# Patient Record
Sex: Male | Born: 1935 | Race: White | Hispanic: No | Marital: Married | Smoking: Never smoker
Health system: Southern US, Community
[De-identification: ages and names within clinical notes are randomized; demographics above are authoritative.]

## PROBLEM LIST (undated history)

## (undated) DIAGNOSIS — Z9109 Other allergy status, other than to drugs and biological substances: Secondary | ICD-10-CM

## (undated) DIAGNOSIS — N189 Chronic kidney disease, unspecified: Secondary | ICD-10-CM

## (undated) DIAGNOSIS — D649 Anemia, unspecified: Secondary | ICD-10-CM

## (undated) DIAGNOSIS — G629 Polyneuropathy, unspecified: Secondary | ICD-10-CM

## (undated) DIAGNOSIS — K219 Gastro-esophageal reflux disease without esophagitis: Secondary | ICD-10-CM

## (undated) DIAGNOSIS — G4700 Insomnia, unspecified: Secondary | ICD-10-CM

## (undated) DIAGNOSIS — C61 Malignant neoplasm of prostate: Secondary | ICD-10-CM

## (undated) DIAGNOSIS — D693 Immune thrombocytopenic purpura: Secondary | ICD-10-CM

## (undated) DIAGNOSIS — Z950 Presence of cardiac pacemaker: Secondary | ICD-10-CM

## (undated) DIAGNOSIS — G4734 Idiopathic sleep related nonobstructive alveolar hypoventilation: Secondary | ICD-10-CM

## (undated) DIAGNOSIS — M1990 Unspecified osteoarthritis, unspecified site: Secondary | ICD-10-CM

## (undated) DIAGNOSIS — I2510 Atherosclerotic heart disease of native coronary artery without angina pectoris: Secondary | ICD-10-CM

## (undated) DIAGNOSIS — D759 Disease of blood and blood-forming organs, unspecified: Secondary | ICD-10-CM

## (undated) DIAGNOSIS — I480 Paroxysmal atrial fibrillation: Secondary | ICD-10-CM

## (undated) DIAGNOSIS — I6389 Other cerebral infarction: Secondary | ICD-10-CM

## (undated) DIAGNOSIS — J449 Chronic obstructive pulmonary disease, unspecified: Secondary | ICD-10-CM

## (undated) DIAGNOSIS — I10 Essential (primary) hypertension: Secondary | ICD-10-CM

## (undated) DIAGNOSIS — E669 Obesity, unspecified: Secondary | ICD-10-CM

## (undated) DIAGNOSIS — I350 Nonrheumatic aortic (valve) stenosis: Secondary | ICD-10-CM

## (undated) DIAGNOSIS — R0600 Dyspnea, unspecified: Secondary | ICD-10-CM

## (undated) DIAGNOSIS — R011 Cardiac murmur, unspecified: Secondary | ICD-10-CM

## (undated) DIAGNOSIS — Z8719 Personal history of other diseases of the digestive system: Secondary | ICD-10-CM

## (undated) DIAGNOSIS — I499 Cardiac arrhythmia, unspecified: Secondary | ICD-10-CM

## (undated) DIAGNOSIS — E785 Hyperlipidemia, unspecified: Secondary | ICD-10-CM

## (undated) DIAGNOSIS — I509 Heart failure, unspecified: Secondary | ICD-10-CM

## (undated) HISTORY — PX: EYE SURGERY: SHX253

## (undated) HISTORY — PX: TESTICLE SURGERY: SHX794

## (undated) HISTORY — PX: CARDIAC VALVE REPLACEMENT: SHX585

## (undated) HISTORY — PX: TONSILLECTOMY: SUR1361

## (undated) HISTORY — PX: INSERT / REPLACE / REMOVE PACEMAKER: SUR710

## (undated) HISTORY — PX: POLYPECTOMY: SHX149

## (undated) HISTORY — PX: TUMOR EXCISION: SHX421

## (undated) HISTORY — PX: CHOLECYSTECTOMY: SHX55

---

## 2004-09-03 ENCOUNTER — Ambulatory Visit: Payer: Self-pay | Admitting: Oncology

## 2005-09-01 ENCOUNTER — Ambulatory Visit: Payer: Self-pay | Admitting: Oncology

## 2006-09-03 ENCOUNTER — Ambulatory Visit: Payer: Self-pay | Admitting: Oncology

## 2006-10-29 ENCOUNTER — Ambulatory Visit: Payer: Self-pay | Admitting: Oncology

## 2006-12-23 ENCOUNTER — Ambulatory Visit: Payer: Self-pay | Admitting: Oncology

## 2007-06-10 ENCOUNTER — Ambulatory Visit: Payer: Self-pay | Admitting: Oncology

## 2011-10-08 DIAGNOSIS — I300 Acute nonspecific idiopathic pericarditis: Secondary | ICD-10-CM | POA: Diagnosis not present

## 2011-10-08 DIAGNOSIS — I2510 Atherosclerotic heart disease of native coronary artery without angina pectoris: Secondary | ICD-10-CM | POA: Diagnosis present

## 2011-10-08 DIAGNOSIS — E669 Obesity, unspecified: Secondary | ICD-10-CM | POA: Diagnosis present

## 2011-10-08 DIAGNOSIS — J15212 Pneumonia due to Methicillin resistant Staphylococcus aureus: Secondary | ICD-10-CM | POA: Diagnosis not present

## 2011-10-08 DIAGNOSIS — E785 Hyperlipidemia, unspecified: Secondary | ICD-10-CM | POA: Diagnosis present

## 2011-10-08 DIAGNOSIS — J9600 Acute respiratory failure, unspecified whether with hypoxia or hypercapnia: Secondary | ICD-10-CM | POA: Diagnosis present

## 2011-10-08 DIAGNOSIS — R791 Abnormal coagulation profile: Secondary | ICD-10-CM | POA: Diagnosis not present

## 2011-10-08 DIAGNOSIS — R0902 Hypoxemia: Secondary | ICD-10-CM | POA: Diagnosis not present

## 2011-10-08 DIAGNOSIS — R1013 Epigastric pain: Secondary | ICD-10-CM | POA: Diagnosis not present

## 2011-10-08 DIAGNOSIS — J189 Pneumonia, unspecified organism: Secondary | ICD-10-CM | POA: Diagnosis not present

## 2011-10-08 DIAGNOSIS — I080 Rheumatic disorders of both mitral and aortic valves: Secondary | ICD-10-CM | POA: Diagnosis not present

## 2011-10-08 DIAGNOSIS — J180 Bronchopneumonia, unspecified organism: Secondary | ICD-10-CM | POA: Diagnosis not present

## 2011-10-08 DIAGNOSIS — I369 Nonrheumatic tricuspid valve disorder, unspecified: Secondary | ICD-10-CM | POA: Diagnosis not present

## 2011-10-08 DIAGNOSIS — N138 Other obstructive and reflux uropathy: Secondary | ICD-10-CM | POA: Diagnosis not present

## 2011-10-08 DIAGNOSIS — R339 Retention of urine, unspecified: Secondary | ICD-10-CM | POA: Diagnosis present

## 2011-10-08 DIAGNOSIS — J90 Pleural effusion, not elsewhere classified: Secondary | ICD-10-CM | POA: Diagnosis not present

## 2011-10-08 DIAGNOSIS — R000 Tachycardia, unspecified: Secondary | ICD-10-CM | POA: Diagnosis not present

## 2011-10-08 DIAGNOSIS — E871 Hypo-osmolality and hyponatremia: Secondary | ICD-10-CM | POA: Diagnosis not present

## 2011-10-08 DIAGNOSIS — M109 Gout, unspecified: Secondary | ICD-10-CM | POA: Diagnosis present

## 2011-10-08 DIAGNOSIS — I4510 Unspecified right bundle-branch block: Secondary | ICD-10-CM | POA: Diagnosis not present

## 2011-10-08 DIAGNOSIS — I4891 Unspecified atrial fibrillation: Secondary | ICD-10-CM | POA: Diagnosis present

## 2011-10-08 DIAGNOSIS — Z7982 Long term (current) use of aspirin: Secondary | ICD-10-CM | POA: Diagnosis not present

## 2011-10-08 DIAGNOSIS — R899 Unspecified abnormal finding in specimens from other organs, systems and tissues: Secondary | ICD-10-CM | POA: Diagnosis not present

## 2011-10-08 DIAGNOSIS — I10 Essential (primary) hypertension: Secondary | ICD-10-CM | POA: Diagnosis present

## 2011-10-08 DIAGNOSIS — R0602 Shortness of breath: Secondary | ICD-10-CM | POA: Diagnosis not present

## 2011-10-08 DIAGNOSIS — I359 Nonrheumatic aortic valve disorder, unspecified: Secondary | ICD-10-CM | POA: Diagnosis not present

## 2011-10-08 DIAGNOSIS — N401 Enlarged prostate with lower urinary tract symptoms: Secondary | ICD-10-CM | POA: Diagnosis present

## 2011-10-08 DIAGNOSIS — I059 Rheumatic mitral valve disease, unspecified: Secondary | ICD-10-CM | POA: Diagnosis not present

## 2011-10-08 DIAGNOSIS — Z8546 Personal history of malignant neoplasm of prostate: Secondary | ICD-10-CM | POA: Diagnosis not present

## 2011-10-08 DIAGNOSIS — R072 Precordial pain: Secondary | ICD-10-CM | POA: Diagnosis not present

## 2011-10-08 DIAGNOSIS — M1990 Unspecified osteoarthritis, unspecified site: Secondary | ICD-10-CM | POA: Diagnosis present

## 2011-10-08 DIAGNOSIS — I209 Angina pectoris, unspecified: Secondary | ICD-10-CM | POA: Diagnosis present

## 2011-10-08 DIAGNOSIS — Z6825 Body mass index (BMI) 25.0-25.9, adult: Secondary | ICD-10-CM | POA: Diagnosis not present

## 2011-10-08 DIAGNOSIS — D6949 Other primary thrombocytopenia: Secondary | ICD-10-CM | POA: Diagnosis not present

## 2011-10-08 DIAGNOSIS — R509 Fever, unspecified: Secondary | ICD-10-CM | POA: Diagnosis not present

## 2011-10-08 DIAGNOSIS — J45901 Unspecified asthma with (acute) exacerbation: Secondary | ICD-10-CM | POA: Diagnosis not present

## 2011-10-16 ENCOUNTER — Encounter (HOSPITAL_COMMUNITY): Payer: Self-pay | Admitting: Acute Care

## 2011-10-16 ENCOUNTER — Inpatient Hospital Stay (HOSPITAL_COMMUNITY): Payer: Medicare Other

## 2011-10-16 ENCOUNTER — Inpatient Hospital Stay (HOSPITAL_COMMUNITY)
Admission: AD | Admit: 2011-10-16 | Discharge: 2011-11-03 | DRG: 853 | Disposition: A | Discharge status: Rehabilitation Facility | Payer: Medicare Other | Source: Other Acute Inpatient Hospital | Attending: Internal Medicine | Admitting: Internal Medicine

## 2011-10-16 DIAGNOSIS — R899 Unspecified abnormal finding in specimens from other organs, systems and tissues: Secondary | ICD-10-CM | POA: Diagnosis not present

## 2011-10-16 DIAGNOSIS — A4102 Sepsis due to Methicillin resistant Staphylococcus aureus: Principal | ICD-10-CM | POA: Diagnosis present

## 2011-10-16 DIAGNOSIS — D696 Thrombocytopenia, unspecified: Secondary | ICD-10-CM | POA: Diagnosis present

## 2011-10-16 DIAGNOSIS — Z825 Family history of asthma and other chronic lower respiratory diseases: Secondary | ICD-10-CM

## 2011-10-16 DIAGNOSIS — IMO0002 Reserved for concepts with insufficient information to code with codable children: Secondary | ICD-10-CM | POA: Diagnosis not present

## 2011-10-16 DIAGNOSIS — J189 Pneumonia, unspecified organism: Secondary | ICD-10-CM | POA: Diagnosis not present

## 2011-10-16 DIAGNOSIS — T797XXA Traumatic subcutaneous emphysema, initial encounter: Secondary | ICD-10-CM | POA: Diagnosis not present

## 2011-10-16 DIAGNOSIS — R05 Cough: Secondary | ICD-10-CM | POA: Diagnosis not present

## 2011-10-16 DIAGNOSIS — R000 Tachycardia, unspecified: Secondary | ICD-10-CM | POA: Diagnosis not present

## 2011-10-16 DIAGNOSIS — G4700 Insomnia, unspecified: Secondary | ICD-10-CM | POA: Insufficient documentation

## 2011-10-16 DIAGNOSIS — E871 Hypo-osmolality and hyponatremia: Secondary | ICD-10-CM | POA: Diagnosis present

## 2011-10-16 DIAGNOSIS — E876 Hypokalemia: Secondary | ICD-10-CM | POA: Diagnosis not present

## 2011-10-16 DIAGNOSIS — E785 Hyperlipidemia, unspecified: Secondary | ICD-10-CM | POA: Diagnosis present

## 2011-10-16 DIAGNOSIS — J869 Pyothorax without fistula: Secondary | ICD-10-CM | POA: Diagnosis present

## 2011-10-16 DIAGNOSIS — K560 Paralytic ileus: Secondary | ICD-10-CM | POA: Diagnosis present

## 2011-10-16 DIAGNOSIS — R109 Unspecified abdominal pain: Secondary | ICD-10-CM | POA: Diagnosis not present

## 2011-10-16 DIAGNOSIS — E669 Obesity, unspecified: Secondary | ICD-10-CM | POA: Diagnosis not present

## 2011-10-16 DIAGNOSIS — C61 Malignant neoplasm of prostate: Secondary | ICD-10-CM | POA: Insufficient documentation

## 2011-10-16 DIAGNOSIS — M1990 Unspecified osteoarthritis, unspecified site: Secondary | ICD-10-CM | POA: Insufficient documentation

## 2011-10-16 DIAGNOSIS — I359 Nonrheumatic aortic valve disorder, unspecified: Secondary | ICD-10-CM

## 2011-10-16 DIAGNOSIS — I10 Essential (primary) hypertension: Secondary | ICD-10-CM | POA: Diagnosis present

## 2011-10-16 DIAGNOSIS — Z8546 Personal history of malignant neoplasm of prostate: Secondary | ICD-10-CM

## 2011-10-16 DIAGNOSIS — Z79899 Other long term (current) drug therapy: Secondary | ICD-10-CM | POA: Diagnosis not present

## 2011-10-16 DIAGNOSIS — K5900 Constipation, unspecified: Secondary | ICD-10-CM

## 2011-10-16 DIAGNOSIS — J45909 Unspecified asthma, uncomplicated: Secondary | ICD-10-CM | POA: Diagnosis present

## 2011-10-16 DIAGNOSIS — M109 Gout, unspecified: Secondary | ICD-10-CM | POA: Diagnosis present

## 2011-10-16 DIAGNOSIS — J9600 Acute respiratory failure, unspecified whether with hypoxia or hypercapnia: Secondary | ICD-10-CM | POA: Diagnosis not present

## 2011-10-16 DIAGNOSIS — R142 Eructation: Secondary | ICD-10-CM | POA: Diagnosis not present

## 2011-10-16 DIAGNOSIS — R0602 Shortness of breath: Secondary | ICD-10-CM | POA: Diagnosis not present

## 2011-10-16 DIAGNOSIS — T17908A Unspecified foreign body in respiratory tract, part unspecified causing other injury, initial encounter: Secondary | ICD-10-CM | POA: Diagnosis not present

## 2011-10-16 DIAGNOSIS — J860 Pyothorax with fistula: Secondary | ICD-10-CM | POA: Diagnosis not present

## 2011-10-16 DIAGNOSIS — Z7982 Long term (current) use of aspirin: Secondary | ICD-10-CM

## 2011-10-16 DIAGNOSIS — A419 Sepsis, unspecified organism: Secondary | ICD-10-CM | POA: Diagnosis present

## 2011-10-16 DIAGNOSIS — K219 Gastro-esophageal reflux disease without esophagitis: Secondary | ICD-10-CM | POA: Diagnosis not present

## 2011-10-16 DIAGNOSIS — J9819 Other pulmonary collapse: Secondary | ICD-10-CM | POA: Diagnosis not present

## 2011-10-16 DIAGNOSIS — J90 Pleural effusion, not elsewhere classified: Secondary | ICD-10-CM | POA: Diagnosis not present

## 2011-10-16 DIAGNOSIS — R141 Gas pain: Secondary | ICD-10-CM | POA: Diagnosis not present

## 2011-10-16 DIAGNOSIS — Z7901 Long term (current) use of anticoagulants: Secondary | ICD-10-CM

## 2011-10-16 DIAGNOSIS — R0600 Dyspnea, unspecified: Secondary | ICD-10-CM | POA: Diagnosis present

## 2011-10-16 DIAGNOSIS — Z8614 Personal history of Methicillin resistant Staphylococcus aureus infection: Secondary | ICD-10-CM | POA: Diagnosis not present

## 2011-10-16 DIAGNOSIS — I4891 Unspecified atrial fibrillation: Secondary | ICD-10-CM | POA: Diagnosis present

## 2011-10-16 DIAGNOSIS — Z09 Encounter for follow-up examination after completed treatment for conditions other than malignant neoplasm: Secondary | ICD-10-CM | POA: Diagnosis not present

## 2011-10-16 DIAGNOSIS — J9811 Atelectasis: Secondary | ICD-10-CM | POA: Diagnosis present

## 2011-10-16 DIAGNOSIS — J9383 Other pneumothorax: Secondary | ICD-10-CM | POA: Diagnosis not present

## 2011-10-16 DIAGNOSIS — R5381 Other malaise: Secondary | ICD-10-CM | POA: Diagnosis not present

## 2011-10-16 DIAGNOSIS — J13 Pneumonia due to Streptococcus pneumoniae: Secondary | ICD-10-CM | POA: Diagnosis not present

## 2011-10-16 DIAGNOSIS — R0609 Other forms of dyspnea: Secondary | ICD-10-CM | POA: Diagnosis not present

## 2011-10-16 DIAGNOSIS — G629 Polyneuropathy, unspecified: Secondary | ICD-10-CM | POA: Insufficient documentation

## 2011-10-16 DIAGNOSIS — D473 Essential (hemorrhagic) thrombocythemia: Secondary | ICD-10-CM | POA: Insufficient documentation

## 2011-10-16 DIAGNOSIS — J15212 Pneumonia due to Methicillin resistant Staphylococcus aureus: Secondary | ICD-10-CM | POA: Diagnosis present

## 2011-10-16 DIAGNOSIS — J984 Other disorders of lung: Secondary | ICD-10-CM | POA: Diagnosis not present

## 2011-10-16 DIAGNOSIS — Z8249 Family history of ischemic heart disease and other diseases of the circulatory system: Secondary | ICD-10-CM

## 2011-10-16 DIAGNOSIS — R143 Flatulence: Secondary | ICD-10-CM | POA: Diagnosis not present

## 2011-10-16 DIAGNOSIS — G4734 Idiopathic sleep related nonobstructive alveolar hypoventilation: Secondary | ICD-10-CM | POA: Insufficient documentation

## 2011-10-16 DIAGNOSIS — Z9109 Other allergy status, other than to drugs and biological substances: Secondary | ICD-10-CM | POA: Insufficient documentation

## 2011-10-16 DIAGNOSIS — R339 Retention of urine, unspecified: Secondary | ICD-10-CM | POA: Diagnosis not present

## 2011-10-16 DIAGNOSIS — Y9210 Unspecified residential institution as the place of occurrence of the external cause: Secondary | ICD-10-CM | POA: Diagnosis not present

## 2011-10-16 DIAGNOSIS — I959 Hypotension, unspecified: Secondary | ICD-10-CM | POA: Diagnosis not present

## 2011-10-16 DIAGNOSIS — I480 Paroxysmal atrial fibrillation: Secondary | ICD-10-CM | POA: Insufficient documentation

## 2011-10-16 DIAGNOSIS — Z5189 Encounter for other specified aftercare: Secondary | ICD-10-CM | POA: Diagnosis not present

## 2011-10-16 DIAGNOSIS — R0902 Hypoxemia: Secondary | ICD-10-CM

## 2011-10-16 DIAGNOSIS — J159 Unspecified bacterial pneumonia: Secondary | ICD-10-CM | POA: Diagnosis not present

## 2011-10-16 DIAGNOSIS — R918 Other nonspecific abnormal finding of lung field: Secondary | ICD-10-CM | POA: Diagnosis not present

## 2011-10-16 DIAGNOSIS — K567 Ileus, unspecified: Secondary | ICD-10-CM | POA: Diagnosis present

## 2011-10-16 HISTORY — DX: Unspecified osteoarthritis, unspecified site: M19.90

## 2011-10-16 HISTORY — DX: Paroxysmal atrial fibrillation: I48.0

## 2011-10-16 HISTORY — DX: Malignant neoplasm of prostate: C61

## 2011-10-16 HISTORY — DX: Idiopathic sleep related nonobstructive alveolar hypoventilation: G47.34

## 2011-10-16 HISTORY — DX: Insomnia, unspecified: G47.00

## 2011-10-16 HISTORY — DX: Polyneuropathy, unspecified: G62.9

## 2011-10-16 HISTORY — DX: Essential (primary) hypertension: I10

## 2011-10-16 HISTORY — DX: Immune thrombocytopenic purpura: D69.3

## 2011-10-16 HISTORY — DX: Obesity, unspecified: E66.9

## 2011-10-16 HISTORY — DX: Hyperlipidemia, unspecified: E78.5

## 2011-10-16 HISTORY — DX: Other allergy status, other than to drugs and biological substances: Z91.09

## 2011-10-16 LAB — PHOSPHORUS: Phosphorus: 3.2 mg/dL (ref 2.3–4.6)

## 2011-10-16 LAB — COMPREHENSIVE METABOLIC PANEL
ALT: 39 U/L (ref 0–53)
CO2: 29 mEq/L (ref 19–32)
Calcium: 8.2 mg/dL — ABNORMAL LOW (ref 8.4–10.5)
Creatinine, Ser: 1.25 mg/dL (ref 0.50–1.35)
GFR calc Af Amer: 59 mL/min — ABNORMAL LOW (ref >90)
GFR calc non Af Amer: 51 mL/min — ABNORMAL LOW (ref >90)
Glucose, Bld: 160 mg/dL — ABNORMAL HIGH (ref 70–99)
Sodium: 133 mEq/L — ABNORMAL LOW (ref 135–145)
Total Protein: 5.6 g/dL — ABNORMAL LOW (ref 6.0–8.3)

## 2011-10-16 LAB — LEGIONELLA ANTIGEN, URINE

## 2011-10-16 LAB — PROTIME-INR: INR: 1.54 — ABNORMAL HIGH (ref 0.00–1.49)

## 2011-10-16 LAB — GLUCOSE, CAPILLARY
Glucose-Capillary: 140 mg/dL — ABNORMAL HIGH (ref 70–99)
Glucose-Capillary: 149 mg/dL — ABNORMAL HIGH (ref 70–99)

## 2011-10-16 LAB — CARDIAC PANEL(CRET KIN+CKTOT+MB+TROPI)
CK, MB: 1.6 ng/mL (ref 0.3–4.0)
Total CK: 23 U/L (ref 7–232)
Troponin I: <0.3 ng/mL (ref <0.30)

## 2011-10-16 LAB — APTT: aPTT: 33 seconds (ref 24–37)

## 2011-10-16 LAB — MRSA PCR SCREENING: MRSA by PCR: POSITIVE — AB

## 2011-10-16 LAB — CBC
Hemoglobin: 14.8 g/dL (ref 13.0–17.0)
MCH: 32.1 pg (ref 26.0–34.0)
MCHC: 34.3 g/dL (ref 30.0–36.0)
MCV: 93.5 fL (ref 78.0–100.0)
Platelets: 438 10*3/uL — ABNORMAL HIGH (ref 150–400)
RBC: 4.61 MIL/uL (ref 4.22–5.81)

## 2011-10-16 LAB — SEDIMENTATION RATE: Sed Rate: 25 mm/hr — ABNORMAL HIGH (ref 0–16)

## 2011-10-16 LAB — PROCALCITONIN: Procalcitonin: 0.2 ng/mL

## 2011-10-16 MED ORDER — HEPARIN BOLUS VIA INFUSION
4000.0000 [IU] | Freq: Once | INTRAVENOUS | Status: DC
  Filled 2011-10-16: qty 4000

## 2011-10-16 MED ORDER — SODIUM CHLORIDE 0.9 % IV SOLN
INTRAVENOUS | Status: DC
  Administered 2011-10-16 – 2011-10-23 (×7): via INTRAVENOUS

## 2011-10-16 MED ORDER — AMIODARONE HCL IN DEXTROSE 360-4.14 MG/200ML-% IV SOLN
0.5000 mg/min | INTRAVENOUS | Status: DC
  Administered 2011-10-16 – 2011-10-17 (×2): 0.5 mg/min via INTRAVENOUS
  Filled 2011-10-16 (×4): qty 200

## 2011-10-16 MED ORDER — POLYETHYLENE GLYCOL 3350 17 G PO PACK
17.0000 g | PACK | Freq: Every day | ORAL | Status: AC
  Administered 2011-10-16 – 2011-10-18 (×3): 17 g via ORAL
  Filled 2011-10-16 (×4): qty 1

## 2011-10-16 MED ORDER — VANCOMYCIN HCL 1000 MG IV SOLR
750.0000 mg | INTRAVENOUS | Status: AC
  Administered 2011-10-16: 750 mg via INTRAVENOUS
  Filled 2011-10-16: qty 750

## 2011-10-16 MED ORDER — IOHEXOL 300 MG/ML  SOLN: 20.0000 mL | INTRAMUSCULAR | Status: AC

## 2011-10-16 MED ORDER — ALBUTEROL SULFATE (5 MG/ML) 0.5% IN NEBU
2.5000 mg | INHALATION_SOLUTION | Freq: Four times a day (QID) | RESPIRATORY_TRACT | Status: DC
  Administered 2011-10-16 – 2011-10-19 (×12): 2.5 mg via RESPIRATORY_TRACT
  Filled 2011-10-16 (×12): qty 0.5

## 2011-10-16 MED ORDER — INSULIN ASPART 100 UNIT/ML ~~LOC~~ SOLN
1.0000 [IU] | SUBCUTANEOUS | Status: DC | PRN
  Administered 2011-10-19 (×2): 1 [IU] via SUBCUTANEOUS
  Filled 2011-10-16: qty 3

## 2011-10-16 MED ORDER — AMIODARONE HCL IN DEXTROSE 360-4.14 MG/200ML-% IV SOLN
1.0000 mg/min | INTRAVENOUS | Status: AC
  Administered 2011-10-16 (×2): 1 mg/min via INTRAVENOUS
  Filled 2011-10-16 (×4): qty 200

## 2011-10-16 MED ORDER — ACETAMINOPHEN 325 MG PO TABS
650.0000 mg | ORAL_TABLET | Freq: Four times a day (QID) | ORAL | Status: DC | PRN
  Administered 2011-10-16 – 2011-10-22 (×2): 650 mg via ORAL
  Filled 2011-10-16 (×2): qty 2

## 2011-10-16 MED ORDER — HEPARIN BOLUS VIA INFUSION
2000.0000 [IU] | Freq: Once | INTRAVENOUS | Status: AC
  Administered 2011-10-16: 2000 [IU] via INTRAVENOUS
  Filled 2011-10-16: qty 2000

## 2011-10-16 MED ORDER — PANTOPRAZOLE SODIUM 40 MG IV SOLR
40.0000 mg | Freq: Two times a day (BID) | INTRAVENOUS | Status: DC
  Administered 2011-10-16 – 2011-10-18 (×5): 40 mg via INTRAVENOUS
  Filled 2011-10-16 (×5): qty 40

## 2011-10-16 MED ORDER — AMIODARONE LOAD VIA INFUSION
150.0000 mg | Freq: Once | INTRAVENOUS | Status: AC
  Administered 2011-10-16: 150 mg via INTRAVENOUS
  Filled 2011-10-16: qty 83.34

## 2011-10-16 MED ORDER — AMIODARONE LOAD VIA INFUSION
150.0000 mg | Freq: Once | INTRAVENOUS | Status: DC
  Filled 2011-10-16: qty 83.34

## 2011-10-16 MED ORDER — HEPARIN SOD (PORCINE) IN D5W 100 UNIT/ML IV SOLN
1650.0000 [IU]/h | INTRAVENOUS | Status: DC
  Administered 2011-10-16 (×2): 1250 [IU]/h via INTRAVENOUS
  Administered 2011-10-17: 1650 [IU]/h via INTRAVENOUS
  Filled 2011-10-16 (×2): qty 250

## 2011-10-16 MED ORDER — MAGNESIUM CITRATE PO SOLN
1.0000 | Freq: Once | ORAL | Status: AC
  Administered 2011-10-16: 1 via ORAL
  Filled 2011-10-16: qty 296

## 2011-10-16 MED ORDER — DILTIAZEM HCL 100 MG IV SOLR
5.0000 mg/h | INTRAVENOUS | Status: DC
  Administered 2011-10-16: 20 mg/h via INTRAVENOUS
  Filled 2011-10-16: qty 100

## 2011-10-16 MED ORDER — EPINEPHRINE HCL 0.1 MG/ML IJ SOLN
INTRAMUSCULAR | Status: AC
  Filled 2011-10-16: qty 10

## 2011-10-16 MED ORDER — VANCOMYCIN HCL 1000 MG IV SOLR
750.0000 mg | Freq: Two times a day (BID) | INTRAVENOUS | Status: DC
  Administered 2011-10-17 – 2011-10-19 (×6): 750 mg via INTRAVENOUS
  Filled 2011-10-16 (×7): qty 750

## 2011-10-16 MED ORDER — ERYTHROMYCIN BASE 250 MG PO TBEC
500.0000 mg | DELAYED_RELEASE_TABLET | Freq: Three times a day (TID) | ORAL | Status: AC
  Administered 2011-10-16 – 2011-10-17 (×3): 500 mg via ORAL
  Filled 2011-10-16 (×5): qty 2

## 2011-10-16 MED ORDER — PIPERACILLIN-TAZOBACTAM 3.375 G IVPB
3.3750 g | Freq: Three times a day (TID) | INTRAVENOUS | Status: DC
  Administered 2011-10-16 – 2011-10-19 (×10): 3.375 g via INTRAVENOUS
  Filled 2011-10-16 (×12): qty 50

## 2011-10-16 MED ORDER — SODIUM CHLORIDE 0.9 % IV SOLN: 750.0000 mg | Freq: Two times a day (BID) | INTRAVENOUS | Status: DC

## 2011-10-16 NOTE — Progress Notes (Signed)
[facility] NOTE  Pharmacy Consult for Vancomycin, Heparin IV Indication: MRSA PNA, Hx afib    Allergies not on file  Patient Measurements: Height: 5\' 11"  (180.3 cm) Weight: 195 lb 8.8 oz (88.7 kg) IBW/kg (Calculated) : 75.3  Heparin dosing weight:89Kg  Vital Signs:   Intake/Output from previous day:   Intake/Output from this shift:   Vent settings for last 24 hours:    Labs: No results found for this basename: WBC:3,HGB:3,HCT:3,PLT:3,APTT:3,INR:3,CREATININE:3,LABCREA:3,CREATININE:3,LABCREA:3,CREAT24HRUR:3,MG:3,PHOS:3,ALBUMIN:3,PROT:3,AST:3,ALT:3,ALKPHOS:3,BILITOT:3,BILIDIR:3,IBILI:3 in the last 72 hours CrCl is unknown because no creatinine reading has been taken.   Basename 10/16/11 [place]    Microbiology: No results found for this or any previous visit (from the past 720 hour(s)).  Medications:  Albuterol, allopurinol, BASA, astelin, pulmicort, tums, cardizem CD, hydroxyurea, nasonex, fish oil, lyrica, zocor, flomax, warfarin, apap prn, voltaren gel, ambien  Admit Complaint: Patient admitted to [facility] with SOB, fever and progressively worsening cough. Transferred here for further management of PNA, and concerns for possible empyema.    Pharmacist System-Based Medication Review: Anticoagulation Hx Afib, on Dilt and warfarin PTA. INR 1.5 here today. Received orders to start IV heparin in anticipation of procedures while in ICU. H/H: 12.7/38.8 and Plts 439 today at OSH.  Infectious Disease Vanc#6 for MRSA PNA, started at OSH at 1500mg  IV q 24h, trough on 1/8 was low at 18mcg/ml, dose was increased to Vanc 1gm IV q 12h, last dose documented at OSH was at 2200 1/9. Patient was also on Rocephin and Levaquin at OSH. WBC today at OSH was 18.2. CCM team contemplating GNR coverage.  Cardiovascular Afib- on dilt, warfarin, zocor, fish oil PTA.  Endocrinology No prior hx DM  Gastrointestinal / Nutrition f/up plans  Neurology A & O, conversant    Nephrology Scr 1.11 at OSH, est GFR ~50ml/min  Pulmonary Astelin, pulmicort, nasonex PTA  Hematology / Oncology Hydrxyurea PTA, WBC elevated, Plts incr likely d/t sepsis, H/H ok at baseline  PTA Medication Issues Med rec tech completing med rec, above list obtained from OSH  Best Practices IV heparin    Goal of Therapy:  Vanc trough 15-20 mcg/ml Heparin level 0.3-0.7 iu/ ml   Plan:  1. Vancomycin 750mg  IV q 12h, start ASAP 2. Plan to check Css trough for MRSA PNA 3. Heparin drip at 1250 units/hr after a 2000 units IV bolus (bolus decreased d/t elevated INR), check heparin level 8 hours after drip starts. 4. Daily heparin level and CBC 5. Check daily PT/INR  Kaylaann Mountz K. Posey Pronto, PharmD, BCPS.  Clinical Pharmacist Pager [phone]. 10/16/2011 1:56 PM

## 2011-10-16 NOTE — Progress Notes (Addendum)
MEDICATION RELATED CONSULT NOTE - INITIAL   Pharmacy Consult for  Amiodarone Drug/Drug Interactions Medications:  Inpatient Scheduled:    . albuterol  2.5 mg Nebulization Q6H  . amiodarone  150 mg Intravenous Once  . erythromycin  500 mg Oral Q8H  . heparin  2,000 Units Intravenous Once  . magnesium citrate  1 Bottle Oral Once  . pantoprazole (PROTONIX) IV  40 mg Intravenous Q12H  . piperacillin-tazobactam (ZOSYN)  IV  3.375 g Intravenous Q8H  . polyethylene glycol  17 g Oral Daily  . vancomycin  750 mg Intravenous Q12H   Medications:  Prescriptions prior to admission  Medication Sig Dispense Refill  . acetaminophen (TYLENOL) 500 MG tablet Take 500 mg by mouth every other day.      . ALBUTEROL IN Inhale 1 vial into the lungs daily.      . ALLOPURINOL PO Take 1 tablet by mouth daily.      Marland Kitchen aspirin EC 81 MG tablet Take 81 mg by mouth daily.      Marland Kitchen azelastine (ASTELIN) 137 MCG/SPRAY nasal spray Place 1 spray into the nose 2 (two) times daily. Use in each nostril as directed      . Budesonide (PULMICORT IN) Inhale 2 vials into the lungs daily.      Marland Kitchen DILTIAZEM HCL PO Take 1 capsule by mouth daily.      . fish oil-omega-3 fatty acids 1000 MG capsule Take 1 g by mouth daily.      Marland Kitchen HYDROXYUREA PO Take 1 tablet by mouth every other day.      . mometasone (NASONEX) 50 MCG/ACT nasal spray Place 2 sprays into the nose daily.      . pregabalin (LYRICA) 50 MG capsule Take 50 mg by mouth every other day.      Marland Kitchen SIMVASTATIN PO Take 1 tablet by mouth every evening.      . Tamsulosin HCl (FLOMAX) 0.4 MG CAPS Take 0.4 mg by mouth daily.      Marland Kitchen warfarin (COUMADIN) 4 MG tablet Take 4 mg by mouth 5 (five) times daily.      Marland Kitchen warfarin (COUMADIN) 5 MG tablet Take 5 mg by mouth 2 (two) times daily.        Medical History: Past Medical History  Diagnosis Date  . PAF (paroxysmal atrial fibrillation)     followed by [place] cardiology  . Aortic valve disease     followed by [facility] cardiology  .  Prostate cancer     s/p seed implants  . Environmental allergies   . Asthma   . Essential thrombocytopenia     followed by Dr Bobby Rumpf (hematology)  . Neuropathy   . Insomnia   . Gout   . Obesity   . Hypertension   . Hyperlipidemia   . Osteoarthritis   . Hypoxia, sleep related     Assessment: Amiodarone may interact with beta-blockers such as atenolol (Tenormin), propranolol (Inderal), metoprolol (Lopressor), or certain calcium channel blockers, such as verapamil (Calan, Isoptin, Verelan, Covera-HS) or diltiazem (Cardizem, Dilacor, Tiazac), resulting in an excessively slow heart rate or a block in the conduction of the electrical impulse through the heart.  Amiodarone increases the blood levels of digoxin (Lanoxin) when the two drugs are given together. It is recommended that the dose of digoxin be cut by 50% when amiodarone therapy is started. Flecainide (Tambocor) blood concentrations increase by more than 50% with amiodarone. Procainamide (Procan-SR, Pronestyl) and quinidine (Quinidex, Quinaglute) concentrations increase by 30%-50% during the  first week of amiodarone therapy. Additive electrical effects occurs with these combinations, and worsening arrhythmias may occur as a result. Some experts recommend that the doses of these other drugs be reduced when amiodarone is started. Amiodarone can result in phenytoin (Dilantin) toxicity because it causes a two- or three-fold increase in blood concentrations of phenytoin.   Amiodarone also can interact with tricyclic antidepressants (for example, amitriptyline [Endep, Elavil]), or phenothiazines (for example, chlorpromazine [Thorazine]) and potentially cause serious arrhythmias.  Amiodarone interacts with warfarin (Coumadin) and increases the risk of bleeding. The bleeding can be serious or even fatal. This effect can occur as early as 4-6 days after the start of the combination of drugs or can be delayed by a few weeks. Clotting studies probably  should be done early during treatment with amiodarone among patients taking warfarin.  Amiodarone can interact with some cholesterol-lowering medicines of the statin class, such as simvastatin (Zocor), atorvastatin (Lipitor), and lovastatin (Mevacor), increasing the side effects of statins which include severe muscle breakdown, kidney failure or liver disease. This interaction is dose-related, meaning that lower doses of statins are safer than higher doses when used with amiodarone. An alternative statin, pravastatin (Pravachol), does not share this interaction and is safer in patients taking amiodarone.  Amiodarone inhibits the metabolism of dextromethorphan, the cough suppressant found in most over-the-counter (and some prescription) cough and cold medications (for example, Robitussin-DM). Although the significance of the interaction is unknown, these two drugs probably should not be taken together if possible.  Grapefruit juice may reduce the breakdown of amiodarone in the stomach leading to increased amiodarone blood levels. Grapefruit juice should be avoided during treatment with amiodarone  There are 704 reported drug interactions with Amiodarone, however, those listed above are primarily the major ones.  His inpatient regimen does not have any of these, but his outpatient regimen does.  Risk / benefit analysis should be made if this patient will continue to receive Amiodarone at discharge.  Dose lowering for Warfarin, Digoxin, Dilantin and statin therapy are warranted.  Goal of Therapy:  Minimize risk associated with Amiodarone therapy.  Plan:  Monitor for drug/drug interacting medications and adjust doses as needed. Evaluate interacting drug levels to ensure therapeutic goals.    Rober Minion, PharmD., MS Clinical Pharmacist Pager:  [phone] 10/16/2011,4:11 PM

## 2011-10-16 NOTE — H&P (Signed)
Name: Dean [name] MRN: [id number] DOB: May 14, 1924    LOS: [facility] Pulmonary/Critical Care 78 year old male who presented to [facility] [place] hospital on 1/2. Dx eval demonstrated MRSA PNA.Had progressive pleural effusion with thoracentesis on 1/8 showing exudative sample. Presented to [facility] on 1/10 for further evaluation of  worsening of Right sided airspace disease, increased work of breathing, AF w/ RVR and progressive leukocytosis.   Lines / Drains:  Cultures: Sputum 1/7>>>MRSA  Influenza PCR1/2>>> Negative  Antibiotics: Levaquin 1/2>>>1/10 Rocephin 1/2>>>1/10  Vanco 1/5>>> Zosyn 1/10  Tests / Events: ECHO 10/17/2010: EF 55%, preserved systolic function. Mild aortic stenosis.   History of Present Illness: 78 yom who presented to [facility] hospital on 1/2 w/ CC: of progressive abd pain, SOB, cough (non-productive) and chest discomfort after failure to respond  To treatment with ABX and pred taper dating back to 12/1 for was initially nasal discharge and sinus congestion by his PCP.  Denied any significant fever, although says he did have a fever when he was at the hospital. No chills, body aches, nausea, vomiting, diarrhea. Denies any recent travel or sick contacts. On evaluation at [facility] he was noted to have Right sided PNA by CT scan and was treated with empiric levaquin (started on 1/2) Sputum culture identified MRSA on 1/7, Vanc was added on that day. He underwent thoracentesis on 1/8 yielding a exudative sample consistent with a parapneumonia effusion.  His abdomen remained distended and tender. He developed RVR the pm of 1/9 with associated progression of dyspnea he felt related to gastric distention and discomfort. He presented to [facility] on 1/10 for further evaluation of  worsening of Right sided airspace disease, increased work of breathing, AF w/ RVR and progressive leukocytosis.   Past Medical History  Diagnosis Date  . PAF (paroxysmal atrial fibrillation)     followed by [place]  cardiology  . Aortic valve disease     followed by [facility] cardiology  . Prostate cancer     s/p seed implants  . Environmental allergies   . Asthma   . Essential thrombocytopenia     followed by Dr Bobby Rumpf (hematology)  . Neuropathy   . Insomnia   . Gout   . Obesity   . Hypertension   . Hyperlipidemia   . Osteoarthritis   . Hypoxia, sleep related    No past surgical history on file. Prior to Admission medications   Medication Sig Start Date End Date Taking? Authorizing Provider  acetaminophen (TYLENOL) 500 MG tablet Take 500 mg by mouth every other day.   Yes Historical Provider, MD  ALBUTEROL IN Inhale 1 vial into the lungs daily.   Yes Historical Provider, MD  ALLOPURINOL PO Take 1 tablet by mouth daily.   Yes Historical Provider, MD  aspirin EC 81 MG tablet Take 81 mg by mouth daily.   Yes Historical Provider, MD  azelastine (ASTELIN) 137 MCG/SPRAY nasal spray Place 1 spray into the nose 2 (two) times daily. Use in each nostril as directed   Yes Historical Provider, MD  Budesonide (PULMICORT IN) Inhale 2 vials into the lungs daily.   Yes Historical Provider, MD  DILTIAZEM HCL PO Take 1 capsule by mouth daily.   Yes Historical Provider, MD  fish oil-omega-3 fatty acids 1000 MG capsule Take 1 g by mouth daily.   Yes Historical Provider, MD  HYDROXYUREA PO Take 1 tablet by mouth every other day.   Yes Historical Provider, MD  mometasone (NASONEX) 50 MCG/ACT nasal spray Place 2  sprays into the nose daily.   Yes Historical Provider, MD  pregabalin (LYRICA) 50 MG capsule Take 50 mg by mouth every other day.   Yes Historical Provider, MD  SIMVASTATIN PO Take 1 tablet by mouth every evening.   Yes Historical Provider, MD  Tamsulosin HCl (FLOMAX) 0.4 MG CAPS Take 0.4 mg by mouth daily.   Yes Historical Provider, MD  warfarin (COUMADIN) 4 MG tablet Take 4 mg by mouth 5 (five) times daily.   Yes Historical Provider, MD  warfarin (COUMADIN) 5 MG tablet Take 5 mg by mouth 2 (two) times  daily.   Yes Historical Provider, MD   Allergies No Known Allergies  Family History Family History  Problem Relation Age of Onset  . Asthma Maternal Grandmother   . Tuberculosis Maternal Grandmother   . Heart disease Father     died of heart disease  . Alzheimer's disease Mother     Social History:   reports that he has never smoked. He does not have any smokeless tobacco history on file. His alcohol and drug histories not on file.  Review Of Systems  11 points review of systems is negative with an exception of listed in HPI.  Vital Signs: Pulse Rate:  [57-116] 101  (01/10 1445) Resp:  [15-23] 15  (01/10 1445) BP: (94-117)/(62-80) 94/62 mmHg (01/10 1445) SpO2:  [90 %-94 %] 90 % (01/10 1445) Weight:  [88.7 kg (195 lb 8.8 oz)] 88.7 kg (195 lb 8.8 oz) (01/10 1200)      . diltiazem (CARDIZEM) infusion 20 mg/hr (10/16/11 1403)  . heparin 1,250 Units/hr (10/16/11 1443)      Physical Examination: General:  Elderly male resting comfortably in bed. Mild dyspnea with talking.  Neuro: Alert and oriented, no focal deficits   HEENT: no JVD   Cardiovascular: Irregular Lungs: coarse, with diminished breath sounds in the bases.  Abdomen: Distended, tender to palpation. Soft. BS active Musculoskeletal: no edema   Ventilator settings:    Labs and Imaging:   CXR: Bilateral airspace disease R>L with right sided effusion.   ABD: Possible illus, with air throughout colon.    Lab 10/16/11 1313  NA 133*  K 4.8  CL 98  CO2 29  BUN 38*  CREATININE 1.25  GLUCOSE 160*    Lab 10/16/11 1313  HGB 14.8  HCT 43.1  WBC 41.1*  PLT 438*    Assessment and Plan:  Dyspnea in the setting of MRSA PNA and parapneumonic effusion on the right. Complicated by abdominal distention/ ileus resulting atelectasis,  And AF with RVR Plan: -Continue Vancomycin -Add Zosyn, given progressive leukocytosis and worsening CXR -Chest CT to r/o organizing empyema -AM CXR - BD -Tx ileus  Abdominal  distention/Ileus. In the setting of recent hospital stay and quinolone therapy Plan: -NPO except meds -Send C-diff -Bowel regiment -stopping CCB  A-fib with RVR (History of) Plan: -D/C Cardizem drip, switch to Amio with bolus -Heparin drip, will hold heparin for now as he is sub therapeutic and in case needs thoracic eval    Sepsis- primarily PNA/Parapneumonic effusion, possible colitis  Lab 10/16/11 1314  PROCALCITON 0.20    Lab 10/16/11 1313  WBC 41.1*  Plan: -Continue antibiotic therapy -send C-Diff -AM CBC -trend PCT  hyponatremia  Lab 10/16/11 1313  NA 133*  Plan: -AM BMP -IVF  Urinary retention Plan: -place foley  GERD -PPI   Best practices / Disposition: -->ICU status under PCCM -->full code -->Heparin drip DVT Px/A-fib -->Protonix for GI Px -->diet  NPO except meds -->family updated at bedside   BABCOCK,PETE 10/16/2011, 3:03 PM  Patient seen and examined, agree with above note.  SOB likely related to ileus.  Will scan chest for ? Of organizing empyema, abd/pelvic ct for ileus.  Will follow up.  Patient seen and examined, agree with above note.  I dictated the care and orders written for this patient under my direction.  Jennet Maduro, M.D. [phone]

## 2011-10-17 ENCOUNTER — Inpatient Hospital Stay (HOSPITAL_COMMUNITY): Payer: Medicare Other

## 2011-10-17 LAB — CBC
HCT: 41.5 % (ref 39.0–52.0)
Platelets: 384 10*3/uL (ref 150–400)
RBC: 4.36 MIL/uL (ref 4.22–5.81)
RDW: 15.4 % (ref 11.5–15.5)
WBC: 31.8 10*3/uL — ABNORMAL HIGH (ref 4.0–10.5)

## 2011-10-17 LAB — BASIC METABOLIC PANEL
BUN: 29 mg/dL — ABNORMAL HIGH (ref 6–23)
CO2: 31 mEq/L (ref 19–32)
Chloride: 100 mEq/L (ref 96–112)
Creatinine, Ser: 1.23 mg/dL (ref 0.50–1.35)
Glucose, Bld: 126 mg/dL — ABNORMAL HIGH (ref 70–99)
Sodium: 138 mEq/L (ref 135–145)

## 2011-10-17 LAB — HEPARIN LEVEL (UNFRACTIONATED)
Heparin Unfractionated: <0.1 IU/mL — ABNORMAL LOW (ref 0.30–0.70)
Heparin Unfractionated: 0.22 IU/mL — ABNORMAL LOW (ref 0.30–0.70)

## 2011-10-17 LAB — PROTIME-INR: INR: 1.62 — ABNORMAL HIGH (ref 0.00–1.49)

## 2011-10-17 LAB — CARDIAC PANEL(CRET KIN+CKTOT+MB+TROPI)
Relative Index: INVALID (ref 0.0–2.5)
Troponin I: <0.3 ng/mL (ref <0.30)

## 2011-10-17 LAB — GLUCOSE, CAPILLARY
Glucose-Capillary: 128 mg/dL — ABNORMAL HIGH (ref 70–99)
Glucose-Capillary: 167 mg/dL — ABNORMAL HIGH (ref 70–99)

## 2011-10-17 LAB — PROCALCITONIN: Procalcitonin: 0.18 ng/mL

## 2011-10-17 MED ORDER — AMIODARONE HCL IN DEXTROSE 360-4.14 MG/200ML-% IV SOLN
60.0000 mg/h | INTRAVENOUS | Status: DC
  Administered 2011-10-17: 60 mg/h via INTRAVENOUS
  Filled 2011-10-17 (×9): qty 200

## 2011-10-17 MED ORDER — HEPARIN BOLUS VIA INFUSION
3000.0000 [IU] | Freq: Once | INTRAVENOUS | Status: AC
  Administered 2011-10-17: 3000 [IU] via INTRAVENOUS
  Filled 2011-10-17: qty 3000

## 2011-10-17 MED ORDER — HEPARIN SOD (PORCINE) IN D5W 100 UNIT/ML IV SOLN
2300.0000 [IU]/h | INTRAVENOUS | Status: DC
  Administered 2011-10-18 – 2011-10-19 (×3): 2100 [IU]/h via INTRAVENOUS
  Filled 2011-10-17 (×6): qty 250

## 2011-10-17 MED ORDER — DILTIAZEM HCL 30 MG PO TABS
30.0000 mg | ORAL_TABLET | Freq: Four times a day (QID) | ORAL | Status: DC
  Filled 2011-10-17 (×4): qty 1

## 2011-10-17 MED ORDER — DILTIAZEM HCL 60 MG PO TABS
60.0000 mg | ORAL_TABLET | Freq: Three times a day (TID) | ORAL | Status: DC
  Administered 2011-10-17 – 2011-10-19 (×6): 60 mg via ORAL
  Filled 2011-10-17 (×9): qty 1

## 2011-10-17 MED ORDER — HEPARIN BOLUS VIA INFUSION
2000.0000 [IU] | Freq: Once | INTRAVENOUS | Status: AC
  Administered 2011-10-17: 2000 [IU] via INTRAVENOUS
  Filled 2011-10-17: qty 2000

## 2011-10-17 MED ORDER — HEPARIN SOD (PORCINE) IN D5W 100 UNIT/ML IV SOLN
1850.0000 [IU]/h | INTRAVENOUS | Status: DC
  Administered 2011-10-17: 1850 [IU]/h via INTRAVENOUS
  Filled 2011-10-17 (×2): qty 250

## 2011-10-17 MED ORDER — OXYCODONE-ACETAMINOPHEN 5-325 MG PO TABS: 1.0000 | ORAL_TABLET | ORAL | Status: DC | PRN

## 2011-10-17 MED FILL — Amiodarone HCl Inj 150 MG/3ML (50 MG/ML): INTRAVENOUS | Qty: 3 | Status: AC

## 2011-10-17 NOTE — Progress Notes (Signed)
ANTICOAGULATION CONSULT NOTE - Follow Up Consult  Pharmacy Consult for: Heparin Indication: hx afib  No Known Allergies  Vital Signs: Temp: 98.4 F (36.9 C) (01/11 1952) Temp src: Oral (01/11 1952) BP: 123/70 mmHg (01/11 1952) Pulse Rate: 99  (01/11 1952)  Labs:  Basename 10/17/11 2051 10/17/11 1017 10/17/11 0754 10/17/11 0211 10/17/11 0024 10/16/11 2009 10/16/11 1313  HGB -- -- 14.1 -- -- -- 14.8  HCT -- -- 41.5 -- -- -- 43.1  PLT -- -- 384 -- -- -- 438*  APTT -- -- -- -- -- -- 33  LABPROT -- -- 19.5* -- -- -- 18.8*  INR -- -- 1.62* -- -- -- 1.54*  HEPARINUNFRC 0.22* <0.10* -- -- <0.10* -- --  CREATININE -- -- 1.23 -- -- -- 1.25  CKTOTAL -- -- 18 20 -- 23 --  CKMB -- -- 1.4 1.6 -- 1.6 --  TROPONINI -- -- <0.30 <0.30 -- <0.30 --   Estimated Creatinine Clearance: 46.8 ml/min (by C-G formula based on Cr of 1.23).   Medications:  Heparin @ 1850 units/hr    Assessment: 87yom continues on heparin for hx afib. Heparin level of 0.22 remains subtherapeutic.   Goal of Therapy:  Heparin level 0.3-0.7 units/ml   Plan:  1) Increase heparin to 2100 units/hr 2) Follow up heparin level in AM  Deboraha Sprang 10/17/2011,9:49 PM

## 2011-10-17 NOTE — Progress Notes (Signed)
Name: Dean [name] MRN: [id number] DOB: November 08, 1923    LOS: 1  [place] Pulmonary/Critical Care Progress Note  Subjective - 78 year old male who presented to [place] on 1/2. Dx eval demonstrated MRSA PNA.Had progressive pleural effusion with thoracentesis on 1/8 showing exudative sample. Presented to [facility] on 1/10 for further evaluation of  worsening of Right sided airspace disease, increased work of breathing, AF w/ RVR and progressive leukocytosis.   Lines / Drains:  Cultures: Sputum 1/7>>>MRSA  Influenza PCR1/2>>> Negative  Antibiotics: Levaquin 1/2>>>1/10 Rocephin 1/2>>>1/10  Vanco 1/5>>> Zosyn 1/10>>>  Tests / Events: 1/10 CT Chest: increase in right pleural effusion, mucous plugging 1/10 CT abd: no evidence of bowel obstruction or ileus 1/8: thoracentesis on 1/8 yielding a exudative sample consistent with a parapneumonia effusion.   ECHO 10/17/2010: EF 55%, preserved systolic function. Mild aortic stenosis.  1/10- no distress  Vital Signs: Temp:  [97.6 F (36.4 C)-97.8 F (36.6 C)] 97.6 F (36.4 C) (01/11 0422) Pulse Rate:  [57-134] 110  (01/11 0600) Resp:  [15-23] 16  (01/11 0600) BP: (90-118)/(49-80) 115/70 mmHg (01/11 0600) SpO2:  [90 %-96 %] 93 % (01/11 0600) FiO2 (%):  [36 %] 36 % (01/10 1536) Weight:  [195 lb 1.7 oz (88.5 kg)-195 lb 8.8 oz (88.7 kg)] 195 lb 1.7 oz (88.5 kg) (01/11 0500)    Physical Examination: General:  Elderly male resting comfortably in bed. Mild dyspnea with talking.  Neuro: Alert and oriented, no focal deficits   HEENT: no JVD   Cardiovascular: Irregular Lungs: coarse, with diminished breath sounds in the bases.  Abdomen: Distended, tender to palpation. Soft. BS active Musculoskeletal: no edema  Ventilator settings: Vent Mode:  [-]  FiO2 (%):  [36 %] 36 %  Labs and Imaging:   CXR: Bilateral airspace disease R>L with right sided effusion.   ABD: Possible ileus, with air throughout colon.    Lab 10/16/11 1313  NA 133*    K 4.8  CL 98  CO2 29  BUN 38*  CREATININE 1.25  GLUCOSE 160*    Lab 10/16/11 1313  HGB 14.8  HCT 43.1  WBC 41.1*  PLT 438*    Assessment and Plan:  Dyspnea in the setting of MRSA PNA and parapneumonic effusion on the right. Complicated by abdominal distention/ ileus resulting atelectasis,  And AF with RVR Plan: -Continue Vancomycin and Zosyn -AM CXR - BD -Tx ileus Lasix in future, may need to start this Had thora 1/8 only 350 cc, may require repeat vs CT placement Does not appear toxic Will follow up on thora results from [place] Clinical status improved as ileus improved  Abdominal distention/Ileus. In the setting of recent hospital stay and quinolone therapy Plan: -NPO except meds -C-diff pending -Bowel regimen -stopping CCB Improved aftter BM  A-fib with RVR (History of) Plan: -amio started Takes dilt at home , restart in hopes to [employer] all together, would prefer his home regimen Clinically improved, just had thoracentesis, continue heparin  May need repeat thora, hold coumadin  Sepsis- primarily PNA/Parapneumonic effusion, possible colitis  Lab 10/16/11 1314  PROCALCITON 0.20    Lab 10/16/11 1313  WBC 41.1*  Plan: -Continue antibiotic therapy, narrow in am  -C-Diff pending -AM CBC -trend PCT  hyponatremia  Lab 10/16/11 1313  NA 133*  Plan: -AM BMP -IVF  Urinary retention Plan: foley  GERD -PPI   Best practices / Disposition: -->ICU status under PCCM -->full code -->Heparin drip DVT Px/A-fib -->Protonix for GI Px -->diet NPO  except meds -->family updated at bedside  To sdu  Patient seen and examined, agree with above note.  I dictated the care and orders written for this patient under my direction.  Jolene Provost, M.D. [phone]  [name], MD, [place] Pgr: [place]

## 2011-10-17 NOTE — Progress Notes (Signed)
UR Completed.  Dean [name] [id number] 10/17/2011

## 2011-10-17 NOTE — Progress Notes (Signed)
PHARMACY - CRITICAL CARE PROGRESS NOTE  Pharmacy Consult for Heparin IV Indication: Hx afib    Vital Signs: Temp: 98.1 F (36.7 C) (01/11 0814) Temp src: Oral (01/11 0814) BP: 112/54 mmHg (01/11 0900) Pulse Rate: 115  (01/11 0900) Labs:  Basename 10/17/11 0754 10/16/11 1313  WBC 31.8* 41.1*  HGB 14.1 14.8  HCT 41.5 43.1  PLT 384 438*  APTT -- 33  INR 1.62* 1.54*  CREATININE 1.23 1.25  LABCREA -- --  CREATININE 1.23 1.25  LABCREA -- --  CREAT24HRUR -- --  MG -- --  PHOS -- 3.2  ALBUMIN -- 1.9*  PROT -- 5.6*  AST -- 32  ALT -- 39  ALKPHOS -- 76  BILITOT -- 0.9  BILIDIR -- --  IBILI -- --   Heparin level < 0.1  Admit Complaint: Patient admitted to [facility] with SOB, fever and progressively worsening cough. Transferred here for further management of PNA, and concerns for possible empyema.   Pharmacist System-Based Medication Review:  Anticoagulation: Hx Afib, on Dilt and warfarin PTA. INR 1.62 here today. . H/H: 14.1/41.5 and Plts  384 today.  Infectious Disease: Vanc#7/Zosyn#2 for MRSA PNA, started at OSH at 1500mg  IV q 24h, trough on 1/8 was low at 13mcg/ml, dose was increased to Vanc 1gm IV q 12h, last dose documented at OSH was at 2200 1/9. Patient was also on Rocephin and Levaquin at OSH. WBC today at OSH was 31.8.   Cardiovascular: 112/54 HR 115 Afib- on dilt, warfarin, zocor, fish oil PTA. Dilt restarted here. Trying to wean off amio.   Endocrinology: No prior hx DM. CBGs 140-167  Gastrointestinal / Nutrition:  Abd distention. Checking for c.diff.   Neurology:  A & O, conversant  Nephrology: Scr 1.23, est GFR ~29ml/min  Pulmonary: Astelin, pulmicort, nasonex PTA 95% 4L  Hematology / Oncology: Hydrxyurea PTA, leukocytosis, H/H ok at baseline  PTA Medication: Corrected all the meds on medrec  Best Practices IV heparin   Plan:  1. Vancomycin 750mg  IV q 12h 2. Heparin drip at 1850 units/hr after a 3000 units IV bolus   3. Check heparin level 8 hours  after drip starts.

## 2011-10-17 NOTE — Progress Notes (Signed)
PHARMACY - CRITICAL CARE PROGRESS NOTE  Pharmacy Consult for Heparin IV Indication: Hx afib    No Known Allergies  Patient Measurements: Height: 5\' 11"  (180.3 cm) Weight: 195 lb 8.8 oz (88.7 kg) IBW/kg (Calculated) : 75.3  Heparin dosing weight:89Kg  Vital Signs: Temp: 97.8 F (36.6 C) (01/11 0021) Temp src: Oral (01/11 0021) BP: 116/68 mmHg (01/10 2300) Pulse Rate: 113  (01/10 2300) Labs:  Basename 10/16/11 1313  WBC 41.1*  HGB 14.8  HCT 43.1  PLT 438*  APTT 33  INR 1.54*  CREATININE 1.25  LABCREA --  CREATININE 1.25  LABCREA --  CREAT24HRUR --  MG --  PHOS 3.2  ALBUMIN 1.9*  PROT 5.6*  AST 32  ALT 39  ALKPHOS 76  BILITOT 0.9  BILIDIR --  IBILI --   Heparin level < 0.1  Goal of Therapy:  Heparin level 0.3-0.7 iu/ ml   Plan:  Heparin 2000 units IV bolus, then increase Heparin 1650 units/hr.  Check heparin level in 8 hours.  Phillis Knack, PharmD, BCPS 10/17/2011 1:33 AM

## 2011-10-17 NOTE — Progress Notes (Signed)
Report called to receiving nurse.  All past medical history and present hospitalization given in report to receiving nurse/RN, [id number].  All belongings transported with patient.

## 2011-10-18 LAB — HEPARIN LEVEL (UNFRACTIONATED): Heparin Unfractionated: 0.39 IU/mL (ref 0.30–0.70)

## 2011-10-18 LAB — CBC
HCT: 40.7 % (ref 39.0–52.0)
Hemoglobin: 13.8 g/dL (ref 13.0–17.0)
RDW: 15.6 % — ABNORMAL HIGH (ref 11.5–15.5)
WBC: 39.7 10*3/uL — ABNORMAL HIGH (ref 4.0–10.5)

## 2011-10-18 LAB — GLUCOSE, CAPILLARY
Glucose-Capillary: 95 mg/dL (ref 70–99)
Glucose-Capillary: 156 mg/dL — ABNORMAL HIGH (ref 70–99)

## 2011-10-18 LAB — PROTIME-INR
INR: 1.89 — ABNORMAL HIGH (ref 0.00–1.49)
Prothrombin Time: 22 seconds — ABNORMAL HIGH (ref 11.6–15.2)

## 2011-10-18 MED ORDER — OXYCODONE HCL 5 MG PO TABS
5.0000 mg | ORAL_TABLET | ORAL | Status: DC | PRN
  Administered 2011-10-18: 5 mg via ORAL
  Administered 2011-10-19: 10 mg via ORAL
  Filled 2011-10-18: qty 1
  Filled 2011-10-18: qty 2

## 2011-10-18 MED ORDER — HYDROXYUREA 500 MG PO CAPS
500.0000 mg | ORAL_CAPSULE | ORAL | Status: DC
  Administered 2011-10-18 – 2011-10-22 (×3): 500 mg via ORAL
  Filled 2011-10-18 (×3): qty 1

## 2011-10-18 MED ORDER — PANTOPRAZOLE SODIUM 40 MG PO TBEC: 40.0000 mg | DELAYED_RELEASE_TABLET | Freq: Every day | ORAL | Status: DC

## 2011-10-18 MED ORDER — TAMSULOSIN HCL 0.4 MG PO CAPS
0.4000 mg | ORAL_CAPSULE | Freq: Every day | ORAL | Status: DC
  Administered 2011-10-18 – 2011-10-22 (×5): 0.4 mg via ORAL
  Filled 2011-10-18 (×6): qty 1

## 2011-10-18 NOTE — Progress Notes (Signed)
ANTICOAGULATION CONSULT NOTE - Follow Up Consult  Pharmacy Consult for: Heparin Indication: hx afib  No Known Allergies  Vital Signs: Temp: 97.9 F (36.6 C) (01/12 1159) Temp src: Oral (01/12 1159) BP: 127/62 mmHg (01/12 1159) Pulse Rate: 105  (01/12 1159)  Labs:  Basename 10/18/11 0630 10/17/11 2051 10/17/11 1017 10/17/11 0754 10/17/11 0211 10/16/11 2009 10/16/11 1313  HGB 13.8 -- -- 14.1 -- -- --  HCT 40.7 -- -- 41.5 -- -- 43.1  PLT 395 -- -- 384 -- -- 438*  APTT -- -- -- -- -- -- 33  LABPROT 22.0* -- -- 19.5* -- -- 18.8*  INR 1.89* -- -- 1.62* -- -- 1.54*  HEPARINUNFRC 0.39 0.22* <0.10* -- -- -- --  CREATININE -- -- -- 1.23 -- -- 1.25  CKTOTAL -- -- -- 18 20 23  --  CKMB -- -- -- 1.4 1.6 1.6 --  TROPONINI -- -- -- <0.30 <0.30 <0.30 --   Estimated Creatinine Clearance: 50.8 ml/min (by C-G formula based on Cr of 1.23).   Medications:  Heparin @ 2100 units/hr    Assessment: 87yom continues on heparin per RX for hx afib (Diltiazem, warfarin PTA). Heparin level therapeutic @ 0.39.  INR 1.62-->1.89.  Hgb stable.  No bleeding noted.  Goal of Therapy:  Heparin level 0.3-0.7 units/ml   Plan:  1) Continue heparin at current rate.  2) Follow up 8 hour HL.     Caster Fayette E 10/18/2011,1:06 PM

## 2011-10-18 NOTE — Plan of Care (Signed)
Problem: Phase I Progression Outcomes Goal: Voiding-avoid urinary catheter unless indicated Outcome: Not Progressing Pt has foley cath

## 2011-10-18 NOTE — Progress Notes (Signed)
Heparin Protocol:  Repeat heparin level = 0.36 for afib. At goal  Plan:  1. Cont heparin at 2100 units/hr 2. F/u with AM level

## 2011-10-18 NOTE — Progress Notes (Signed)
[facility] [place] TEAM 8  Subjective: [facility] is assuming care of this patient from PCCM as of today.   78 year old male who presented to [place] hospital on 1/2. Dx eval demonstrated MRSA PNA. Had progressive pleural effusion with thoracentesis on 1/8 showing exudative sample. Presented to [facility] on 1/10 for further evaluation of worsening of Right sided airspace disease, increased work of breathing, AF w/ RVR and progressive leukocytosis.   The patient is beginning to expectorate signif amounts of tenacious phelgm.  He states that his SOB has mildly improved.  He cont to have pleuritic type pain in the R chest w/ coughs.  He c/o being thirsty.  He feels that his abdom is mildly distended, but denies abdom pain, n/v, or diarrhea.  He reports no BM for 24hrs.  Cultures:  Sputum 1/7>>>MRSA  Influenza PCR1/2>>> Negative C diff PCR 1/11>>Negative   Antibiotics:  Levaquin 1/2>>>1/10  Rocephin 1/2>>>1/10  Vanco 1/5>>>  Zosyn 1/10>>  Tests / Events:  1/10 CT Chest: increase in right pleural effusion, mucous plugging RLL bronchus 1/10 CT abd: no evidence of bowel obstruction or ileus ECHO 10/17/2010: EF 55%, preserved systolic function. Mild aortic stenosis.   Objective: Weight change: 2.8 kg (6 lb 2.8 oz)  Intake/Output Summary (Last 24 hours) at 10/18/11 1043 Last data filed at 10/18/11 0749  Gross per 24 hour  Intake 2087.3 ml  Output   1250 ml  Net  837.3 ml   Blood pressure 112/64, pulse 88, temperature 98.3 F (36.8 C), temperature source Oral, resp. rate 20, height 5\' 11"  (1.803 m), weight 91.5 kg (201 lb 11.5 oz), SpO2 90.00%.  Physical Exam: General: mild resp distress at rest - able to complete full sentences w/o pause Lungs: very poor airmovement R base - fine crackles on R at mid lung to apex - CTA on L w/o wheeze Cardiovascular: Regular rate and rhythm without murmur gallop or rub normal S1 and S2 Abdomen: Nontender, mildly distended, soft, bowel sounds  positive, no rebound, no ascites, no appreciable mass Extremities: No significant cyanosis, clubbing, or edema bilateral lower extremities  Lab Results:  [facility] 10/17/11 0754 10/16/11 1313  NA 138 133*  K 5.1 4.8  CL 100 98  CO2 31 29  GLUCOSE 126* 160*  BUN 29* 38*  CREATININE 1.23 1.25  CALCIUM 8.0* 8.2*  MG -- --  PHOS -- 3.2    Basename 10/16/11 1313  AST 32  ALT 39  ALKPHOS 76  BILITOT 0.9  PROT 5.6*  ALBUMIN 1.9*    Basename 10/18/11 0630 10/17/11 0754 10/16/11 1313  WBC 39.7* 31.8* 41.1*  NEUTROABS -- -- --  HGB 13.8 14.1 14.8  HCT 40.7 41.5 43.1  MCV 94.4 95.2 93.5  PLT 395 384 438*    Basename 10/17/11 0754 10/17/11 0211 10/16/11 2009  CKTOTAL 18 20 23   CKMB 1.4 1.6 1.6  CKMBINDEX -- -- --  TROPONINI <0.30 <0.30 <0.30   Micro Results: Recent Results (from the past 240 hour(s))  MRSA PCR SCREENING     Status: Abnormal   Collection Time   10/16/11 12:37 PM      Component Value Range Status Comment   MRSA by PCR POSITIVE (*) NEGATIVE  Final   CLOSTRIDIUM DIFFICILE BY PCR     Status: Normal   Collection Time   10/16/11 10:15 PM      Component Value Range Status Comment   C difficile by pcr NEGATIVE  NEGATIVE  Final     Studies/Results:  All recent x-ray/radiology reports have been reviewed in detail.   Medications: I have reviewed the patient's complete medication list.  Assessment/Plan:  RLL MRSA Pna Cont current abx tx   parapneumonic effusion  Effusion appeared to be enlarging via CXR 1/11 - will f/u CXR in AM  Endoluminal filling defect within proximal RLL bronchus via CT scan Likely represents mucous plugging - pt is expectorating large chunks of dry looking sputum - I suspect he is clearing his mucous plug - will add flutter valve - consider vibra-vest if CXR not improved by AM  Illeus/abdom distention No evidence of sbo or illeus via CT of abdom 1/10 - exam w/o signif pain and w/ + BS - will give liquids and follow    PAF Controlled at present - would like to get pt off amio and back to cardizem once pulm status improved - no changes in tx for today  Hyponatremia Resolved  Urinary retention Resume flomax as per home dosing   Aortic valve disease Hemodynamically stable  Essential thrombocytopenia Resume home dose of hydrea  HTN Not an active issue at presnt   Hyperlipidemia Hold tx until more stable   Cherene Altes, MD [facility] Office  [phone] Pager [phone]  On-Call/Text Page:      [email or web address]      password [facility]

## 2011-10-19 ENCOUNTER — Inpatient Hospital Stay (HOSPITAL_COMMUNITY): Payer: Medicare Other

## 2011-10-19 DIAGNOSIS — J9819 Other pulmonary collapse: Secondary | ICD-10-CM

## 2011-10-19 DIAGNOSIS — J13 Pneumonia due to Streptococcus pneumoniae: Secondary | ICD-10-CM

## 2011-10-19 DIAGNOSIS — J9600 Acute respiratory failure, unspecified whether with hypoxia or hypercapnia: Secondary | ICD-10-CM

## 2011-10-19 DIAGNOSIS — J90 Pleural effusion, not elsewhere classified: Secondary | ICD-10-CM

## 2011-10-19 LAB — GLUCOSE, CAPILLARY
Glucose-Capillary: 189 mg/dL — ABNORMAL HIGH (ref 70–99)
Glucose-Capillary: 144 mg/dL — ABNORMAL HIGH (ref 70–99)
Glucose-Capillary: 136 mg/dL — ABNORMAL HIGH (ref 70–99)
Glucose-Capillary: 116 mg/dL — ABNORMAL HIGH (ref 70–99)

## 2011-10-19 LAB — PROTIME-INR
INR: 2.14 — ABNORMAL HIGH (ref 0.00–1.49)
Prothrombin Time: 24.3 seconds — ABNORMAL HIGH (ref 11.6–15.2)

## 2011-10-19 LAB — CBC
HCT: 38.1 % — ABNORMAL LOW (ref 39.0–52.0)
Hemoglobin: 12.9 g/dL — ABNORMAL LOW (ref 13.0–17.0)
MCHC: 33.9 g/dL (ref 30.0–36.0)
RBC: 4.03 MIL/uL — ABNORMAL LOW (ref 4.22–5.81)

## 2011-10-19 LAB — BASIC METABOLIC PANEL
BUN: 22 mg/dL (ref 6–23)
Chloride: 101 mEq/L (ref 96–112)
GFR calc Af Amer: 64 mL/min — ABNORMAL LOW (ref >90)
GFR calc non Af Amer: 56 mL/min — ABNORMAL LOW (ref >90)
Potassium: 4.5 mEq/L (ref 3.5–5.1)
Sodium: 135 mEq/L (ref 135–145)

## 2011-10-19 LAB — POCT I-STAT 3, ART BLOOD GAS (G3+)
O2 Saturation: 99 %
pCO2 arterial: 55.2 mmHg — ABNORMAL HIGH (ref 35.0–45.0)
pH, Arterial: 7.297 — ABNORMAL LOW (ref 7.350–7.450)

## 2011-10-19 LAB — BLOOD GAS, ARTERIAL
Acid-Base Excess: 2.5 mmol/L — ABNORMAL HIGH (ref 0.0–2.0)
Bicarbonate: 26.2 mEq/L — ABNORMAL HIGH (ref 20.0–24.0)
O2 Saturation: 91.1 %
Patient temperature: 98.6
TCO2: 27.4 mmol/L (ref 0–100)

## 2011-10-19 LAB — VANCOMYCIN, TROUGH: Vancomycin Tr: 16.7 ug/mL (ref 10.0–20.0)

## 2011-10-19 LAB — CULTURE, RESPIRATORY W GRAM STAIN

## 2011-10-19 MED ORDER — NALOXONE HCL 0.4 MG/ML IJ SOLN
INTRAMUSCULAR | Status: AC
  Administered 2011-10-19: 0.4 mg
  Filled 2011-10-19: qty 1

## 2011-10-19 MED ORDER — LEVALBUTEROL HCL 0.63 MG/3ML IN NEBU
0.6300 mg | INHALATION_SOLUTION | Freq: Four times a day (QID) | RESPIRATORY_TRACT | Status: DC
  Administered 2011-10-19 – 2011-11-03 (×58): 0.63 mg via RESPIRATORY_TRACT
  Filled 2011-10-19 (×65): qty 3

## 2011-10-19 MED ORDER — MORPHINE SULFATE 2 MG/ML IJ SOLN
1.0000 mg | INTRAMUSCULAR | Status: DC | PRN
  Administered 2011-10-20: 1 mg via INTRAVENOUS
  Administered 2011-10-20: 2 mg via INTRAVENOUS
  Administered 2011-10-20 (×2): 1 mg via INTRAVENOUS
  Administered 2011-10-20: 2 mg via INTRAVENOUS
  Administered 2011-10-21: 1 mg via INTRAVENOUS
  Administered 2011-10-22: 2 mg via INTRAVENOUS
  Filled 2011-10-19 (×6): qty 1

## 2011-10-19 MED ORDER — SODIUM CHLORIDE 0.9 % IJ SOLN
INTRAMUSCULAR | Status: AC
  Administered 2011-10-19: 40 mL
  Filled 2011-10-19: qty 50

## 2011-10-19 MED ORDER — MIDAZOLAM HCL 2 MG/2ML IJ SOLN
INTRAMUSCULAR | Status: AC
  Administered 2011-10-19: 2 mg
  Filled 2011-10-19: qty 2

## 2011-10-19 MED ORDER — IMIPENEM-CILASTATIN 500 MG IV SOLR
500.0000 mg | Freq: Three times a day (TID) | INTRAVENOUS | Status: DC
  Administered 2011-10-19 – 2011-10-22 (×9): 500 mg via INTRAVENOUS
  Filled 2011-10-19 (×13): qty 500

## 2011-10-19 MED ORDER — PANTOPRAZOLE SODIUM 40 MG IV SOLR
40.0000 mg | INTRAVENOUS | Status: DC
  Administered 2011-10-20 – 2011-10-24 (×6): 40 mg via INTRAVENOUS
  Filled 2011-10-19 (×7): qty 40

## 2011-10-19 MED ORDER — INSULIN ASPART 100 UNIT/ML ~~LOC~~ SOLN
0.0000 [IU] | SUBCUTANEOUS | Status: DC
  Administered 2011-10-19: 2 [IU] via SUBCUTANEOUS
  Administered 2011-10-19 – 2011-10-22 (×5): 1 [IU] via SUBCUTANEOUS
  Administered 2011-10-22 – 2011-10-23 (×2): 2 [IU] via SUBCUTANEOUS
  Administered 2011-10-23: 1 [IU] via SUBCUTANEOUS
  Administered 2011-10-24 (×2): 2 [IU] via SUBCUTANEOUS
  Administered 2011-10-25 – 2011-10-30 (×16): 1 [IU] via SUBCUTANEOUS
  Administered 2011-10-30: 2 [IU] via SUBCUTANEOUS
  Administered 2011-10-30 – 2011-11-02 (×6): 1 [IU] via SUBCUTANEOUS
  Filled 2011-10-19 (×3): qty 3

## 2011-10-19 MED ORDER — FENTANYL CITRATE 0.05 MG/ML IJ SOLN
INTRAMUSCULAR | Status: AC
  Administered 2011-10-19: 100 ug
  Filled 2011-10-19: qty 2

## 2011-10-19 MED ORDER — LIDOCAINE VISCOUS 2 % MT SOLN
15.0000 mL | Freq: Once | OROMUCOSAL | Status: AC
  Administered 2011-10-19: 15 mL via OROMUCOSAL
  Filled 2011-10-19: qty 15

## 2011-10-19 MED ORDER — ACETYLCYSTEINE 20 % IN SOLN
4.0000 mL | Freq: Two times a day (BID) | RESPIRATORY_TRACT | Status: DC
  Administered 2011-10-20 – 2011-10-22 (×6): 4 mL via RESPIRATORY_TRACT
  Filled 2011-10-19 (×11): qty 4

## 2011-10-19 MED ORDER — DILTIAZEM HCL 100 MG IV SOLR
5.0000 mg/h | INTRAVENOUS | Status: DC
  Administered 2011-10-19 (×2): 15 mg/h via INTRAVENOUS
  Administered 2011-10-19: 5 mg/h via INTRAVENOUS
  Administered 2011-10-20 – 2011-10-21 (×4): 15 mg/h via INTRAVENOUS
  Administered 2011-10-21: 12 mg/h via INTRAVENOUS
  Filled 2011-10-19 (×9): qty 100

## 2011-10-19 MED ORDER — LINEZOLID 2 MG/ML IV SOLN
600.0000 mg | Freq: Two times a day (BID) | INTRAVENOUS | Status: DC
  Administered 2011-10-19 – 2011-10-29 (×21): 600 mg via INTRAVENOUS
  Filled 2011-10-19 (×24): qty 300

## 2011-10-19 NOTE — Progress Notes (Signed)
Pt nasally bronched at this time per MD. Instilled oral and nasal lidocaine. ABG done post bronch. Dean [name]

## 2011-10-19 NOTE — Progress Notes (Signed)
[place] NOTE  Pharmacy Consult for Heparin IV Indication: Hx afib    Vital Signs: Temp: 99 F (37.2 C) (01/13 1158) Temp src: Oral (01/13 1158) BP: 111/70 mmHg (01/13 1500) Pulse Rate: 83  (01/13 1500) Labs:  Basename 10/19/11 0500 10/18/11 0630 10/17/11 0754  WBC 36.5* 39.7* 31.8*  HGB 12.9* 13.8 14.1  HCT 38.1* 40.7 41.5  PLT 419* 395 384  APTT -- -- --  INR 2.14* 1.89* 1.62*  CREATININE 1.16 -- 1.23  LABCREA -- -- --  CREATININE 1.16 -- 1.23  LABCREA -- -- --  CREAT24HRUR -- -- --  MG -- -- --  PHOS -- -- --  ALBUMIN -- -- --  PROT -- -- --  AST -- -- --  ALT -- -- --  ALKPHOS -- -- --  BILITOT -- -- --  BILIDIR -- -- --  IBILI -- -- --  HL = 0.38 Vanc trough - 16.7 goal 15-20  Admit Complaint: Patient admitted to [facility] with SOB, fever and progressively worsening cough. Transferred here for further management of PNA, and concerns for possible empyema.  Pharmacist System-Based Medication Review  Anticoag: H/o AFib. Coumadin PTA. Heparin. HL 0.38. INR 2.14 (trending up. No Coumadin since admit on 1/10). Increase IV heparin to 2300 units/hr.  Infectious Disease: Vanc#9 (dc 1/13)/Zosyn#4 (dc 1/13) for MRSA PNA, started at OSH at 1500mg  IV q 24h, trough on 1/8 was low at 70mcg/ml, dose was increased to Vanc 1gm IV q 12h, last dose documented at OSH was at 2200 1/9. Patient was also on Rocephin and Levaquin at OSH. WBC today at OSH was 31.8. Vanc dose changed 01/11 to 750mg  IV q 12 hours. 4 doses now received. Will check vanc trough Sunday. Afebrile. WBC 36.5. MRSA PCR positive. CDiff PCR negative. Abx change to zyvox/primaxin D1  Cardiovascular: Afib- on dilt, warfarin, zocor, fish oil PTA. Dilt drip here.  BP OK, HR 83. BNP 2661. EF 55%. Pt develop RVR last night with hypoxia so tx back to ICU.  Endocrinology: No prior hx DM. CBGs 130-136.   Gastrointestinal / Nutrition: Abd distention.    Neurology: A & O, conversant  Nephrology: No new  BMET today. 01/11: Scr 1.23, est GFR ~35ml/min. K 5.1   Pulmonary: Astelin, pulmicort, nasonex PTA. Trying to avoid intubation. May need thoracentesis in AM  Hematology / Oncology: Hydrxyurea PTA, leukocytosis, H/H ok at baseline  PTA Medication: Corrected all the meds on medrec  Best Practices IV heparin   Plan:  1. Change vanc to zyvox 600mg  IV q12 2. Primaxin 500 mg IV q8 3. Cont heparin at 2300 units/hr

## 2011-10-19 NOTE — Progress Notes (Signed)
Name: Dean [name] MRN: [id number] DOB: June 30, 1924    LOS: 3  [place] [facility]Critical [facility] Progress Note  Subjective - 78 year old male who presented to [place] on 1/2. Dx eval demonstrated MRSA PNA.Had progressive pleural effusion with thoracentesis on 1/8 showing exudative sample. Presented to [facility] on 1/10 for further evaluation of  worsening of Right sided airspace disease, increased work of breathing, AF w/ RVR and progressive leukocytosis.   Overnight events: Pt developed Afib with RVR along with hypoxia and respiratory distress  Lines / Drains:  Cultures: Sputum 1/7>>>MRSA  Influenza PCR1/2>>> Negative  Antibiotics: Vanco 1/5>>> Zosyn 1/10>>> Levaquin 1/2>>>1/10 Rocephin 1/2>>>1/10   Tests / Events: 1/10 CT Chest: increase in right pleural effusion, mucous plugging 1/10 CT abd: no evidence of bowel obstruction or ileus 1/8: thoracentesis on 1/8 yielding a exudative sample consistent with a parapneumonia effusion.   ECHO 10/17/2010: EF 55%, preserved systolic function. Mild aortic stenosis.  1/10- no distress 1/13 - resp distress, hypoxia, complete right lung collapse  Vital Signs: Temp:  [97.9 F (36.6 C)-99.3 F (37.4 C)] 99 F (37.2 C) (01/13 1158) Pulse Rate:  [85-136] 112  (01/13 1200) Resp:  [17-23] 17  (01/13 1200) BP: (103-138)/(58-78) 109/66 mmHg (01/13 1200) SpO2:  [87 %-94 %] 94 % (01/13 1333) FiO2 (%):  [50 %] 50 % (01/13 1333) Weight:  [209 lb 7 oz (95 kg)] 209 lb 7 oz (95 kg) (01/13 0500)    Physical Examination: General:  Elderly male resting in bed. Nonrebreather mask Neuro: Alert and oriented, no focal deficits   HEENT: no JVD   Cardiovascular: Irregular Lungs: bronchial breath sounds Abdomen: Distended, tender to palpation. Soft. BS active Musculoskeletal: no edema  Ventilator settings: Vent Mode:  [-]  FiO2 (%):  [50 %] 50 %  Labs and Imaging:   CXR 1/13: Right lung collapse, right hemithorax completely opacified     Lab 10/19/11 0500 10/17/11 0754 10/16/11 1313  NA 135 138 133*  K 4.5 5.1 4.8  CL 101 100 98  CO2 29 31 29   BUN 22 29* 38*  CREATININE 1.16 1.23 1.25  GLUCOSE 131* 126* 160*    Lab 10/19/11 0500 10/18/11 0630 10/17/11 0754  HGB 12.9* 13.8 14.1  HCT 38.1* 40.7 41.5  WBC 36.5* 39.7* 31.8*  PLT 419* 395 384    Assessment and Plan:  Dyspnea in the setting of MRSA PNA and parapneumonic effusion on the right. Complicated by abdominal distention/ ileus resulting atelectasis, and now complete collapse of right lung. Plan: -Bronchoscopy likely now, trying to avoid intubation. Family aware may need ett for this Post bronch pcxr -Consider repeat thoracentesis in AM. Had thora 1/8 only 350 cc, contributing to ATX? -see ID -AM CXR -Nebs Q12 h with mucomyst -Chest PT Post bronch also would consider NIMV, BIPAP scheduled  Abdominal distention/Ileus. In the setting of recent hospital stay and quinolone therapy Plan: -Improved aftter BM -NPO except meds -C-diff negative -Bowel regimen  A-fib with RVR  Plan: -Currently on cardizem drip at 15 mcg -Continue heparin gtt -Check coags and consider thoracentesis in am -consider neg balance -Hold coumadin  Sepsis- primarily PNA/Parapneumonic effusion, possible colitis  Lab 10/17/11 0754 10/16/11 1314  PROCALCITON 0.18 0.20    Lab 10/19/11 0500 10/18/11 0630 10/17/11 0754  WBC 36.5* 39.7* 31.8*  Plan: Not improving with vanc, zosyn Add linazolid, imipenem, at risk ESBL new organisms -C-Diff negative -AM CBC  Urinary retention Plan: foley  GERD -PPI   Best practices / Disposition: -->  ICU status under PCCM -->full code -->Heparin drip DVT Px/A-fib -->Protonix for GI Px -->diet NPO except meds -->family updated at bedside   Patient seen and examined, agree with above note.  I dictated the care and orders written for this patient under my direction. Ccm 35 min  Dean [name], M.D.   Dean [name]. Dean Mould, MD,  Dean [name]: [place] [facility]

## 2011-10-19 NOTE — Plan of Care (Signed)
Tom callahan with [facility] notified of pt converting to afib with rvr and after 60mg  cardizem po am dose given pt remains with afiv with rvr new orders received . [facility] [employer]

## 2011-10-19 NOTE — Progress Notes (Signed)
[facility] [place] TEAM 8  Subjective: 78 year old male who presented to [place] on 1/2. Dx eval demonstrated MRSA PNA. Had progressive pleural effusion with thoracentesis on 1/8 showing exudative sample. Presented to [facility] on 1/10 for further evaluation of worsening of Right sided airspace disease, increased work of breathing, AF w/ RVR and progressive leukocytosis.   Overnight, the pt has developed RVR in response to his afib, and has also developed a mild worsening of his hypoxia.  He is slightly less comfortable breathing today.  He denies cp, n/v, abdom pain, or HA.  Cultures:  Sputum 1/7>>>MRSA  Influenza PCR1/2>>> Negative C diff PCR 1/11>>Negative   Antibiotics:  Levaquin 1/2>>>1/10  Rocephin 1/2>>>1/10  Vanco 1/5>>>  Zosyn 1/10>>  Tests / Events:  1/10 CT Chest: increase in right pleural effusion, mucous plugging RLL bronchus 1/10 CT abd: no evidence of bowel obstruction or ileus ECHO 10/17/2010: EF 55%, preserved systolic function. Mild aortic stenosis.   Objective: Weight change: 3.5 kg (7 lb 11.5 oz)  Intake/Output Summary (Last 24 hours) at 10/19/11 0951 Last data filed at 10/19/11 0900  Gross per 24 hour  Intake   4030 ml  Output   1100 ml  Net   2930 ml   Blood pressure 103/74, pulse 125, temperature 98 F (36.7 C), temperature source Oral, resp. rate 19, height 5\' 11"  (1.803 m), weight 95 kg (209 lb 7 oz), SpO2 90.00%.  Physical Exam: General: still able to complete full sentences, but respirations are more labored than yesterday Lungs: very poor airmovement th/o right lung - air movement noted in R apex only - CTA on L w/o wheeze Cardiovascular: irreg irreg w/ rapid rate at 130 Abdomen: Nontender, mildly distended, soft, bowel sounds positive, no rebound, no ascites, no appreciable mass Extremities: No significant cyanosis, clubbing, or edema bilateral lower extremities  Lab Results:  Basename 10/19/11 0500 10/17/11 0754 10/16/11 1313    NA 135 138 133*  K 4.5 5.1 4.8  CL 101 100 98  CO2 29 31 29   GLUCOSE 131* 126* 160*  BUN 22 29* 38*  CREATININE 1.16 1.23 1.25  CALCIUM 7.7* 8.0* 8.2*  MG -- -- --  PHOS -- -- 3.2    Basename 10/16/11 1313  AST 32  ALT 39  ALKPHOS 76  BILITOT 0.9  PROT 5.6*  ALBUMIN 1.9*    Basename 10/19/11 0500 10/18/11 0630 10/17/11 0754  WBC 36.5* 39.7* 31.8*  NEUTROABS -- -- --  HGB 12.9* 13.8 14.1  HCT 38.1* 40.7 41.5  MCV 94.5 94.4 95.2  PLT 419* 395 384    Basename 10/17/11 0754 10/17/11 0211 10/16/11 2009  CKTOTAL 18 20 23   CKMB 1.4 1.6 1.6  CKMBINDEX -- -- --  TROPONINI <0.30 <0.30 <0.30   Micro Results: Recent Results (from the past 240 hour(s))  MRSA PCR SCREENING     Status: Abnormal   Collection Time   10/16/11 12:37 PM      Component Value Range Status Comment   MRSA by PCR POSITIVE (*) NEGATIVE  Final   CLOSTRIDIUM DIFFICILE BY PCR     Status: Normal   Collection Time   10/16/11 10:15 PM      Component Value Range Status Comment   C difficile by pcr NEGATIVE  NEGATIVE  Final     Studies/Results: All recent x-ray/radiology reports have been reviewed in detail.   Medications: I have reviewed the patient's complete medication list.  Assessment/Plan:  RLL MRSA Pna Cont current abx  tx   parapneumonic effusion  Effusion appeared to be enlarging via CXR 1/11 - chest x-ray again today suggests even further worsening now with diffuse compressive atelectasis of the entire right lung - I will re-consult critical care who signed off of this patient 10/17/2011  Endoluminal filling defect within proximal RLL bronchus via CT scan Likely represents mucous plugging - pt was expectorating large chunks of dry looking sputum yesterday, but is no longer able to do so - though I suspected he was clearing his mucous plug yesterday, his CXR today suggests that he has either developed a worse plug, or has developed a very large compressive effusion - I have re-consulted PCCM -  we are transferring the pt to the ICU with plans to likely perform a bronch asap  Illeus/abdom distention No evidence of sbo or illeus via CT of abdom 1/10 - tolerating clear liquids thus far  PAF Uncontrolled at present - likely due to pulm process/distress - cont cardizem gtt - address drving factor as noted above   Hyponatremia Resolved  Urinary retention Resumed flomax as per home dosing   Aortic valve disease Hemodynamically stable at present  Essential thrombocytopenia Resumed home dose of hydrea  HTN Not an active issue at present   Hyperlipidemia Hold tx until more stable   Cherene Altes, MD [facility] Office  [phone] Pager [phone]  On-Call/Text Page:      [email or web address]      password [facility]

## 2011-10-19 NOTE — Progress Notes (Signed)
[facility] progress note. Chief complaint tachycardia. History of present illness. This 78 year old male in hospital with a complicated medical history. And being treated for MRSA pneumonia. He has had atrial fib with rapid ventricular response and was converted from amiodarone drip to by mouth Cardizem. Patient noted by the staff to be tachycardic and rhythm strip is suggestive of recurrent atrial fib with rapid ventricular response. The patient has no specific complaints at this time. Specifically he denies chest pain, diaphoresis, nausea. He states there is some dyspnea associated with his pneumonia. Vital signs. Temperature 98.3, pulse 125 apical, respiration 18, blood pressure 1:15/73. General appearance. This is a frail-appearing elderly male in no distress. Cardiac. Irregular and tachycardic. I see no jugular venous distention. He does have mild peripheral edema. Lungs. Course rhonchi bilaterally. No distress and stable O2 sats. Abdomen. Soft with positive bowel sounds. No pain.  Impression/plan. Problem #1 recurrent A. fib. We will obtain a stat 12-lead EKG to document. This does look like atrial fib per rhythm strip. I would discontinue all oral Cardizem and placed on a Cardizem drip 5-15 mg per hour titrated to keep pulse in the 75-95 range. Once controlled again a higher dose of by mouth Cardizem or possibly a digoxin can be tried. Will defer to the discretion of rounding physician.

## 2011-10-19 NOTE — Progress Notes (Signed)
ANTICOAGULATION CONSULT NOTE - Follow Up Consult  Pharmacy Consult for: Heparin Indication: hx afib  No Known Allergies  Vital Signs: Temp: 98.8 F (37.1 C) (01/13 0400) Temp src: Oral (01/13 0400) BP: 125/64 mmHg (01/13 0400) Pulse Rate: 85  (01/13 0400)  Labs:  Basename 10/19/11 0500 10/18/11 1525 10/18/11 0630 10/17/11 0754 10/17/11 0211 10/16/11 2009 10/16/11 1313  HGB 12.9* -- 13.8 -- -- -- --  HCT 38.1* -- 40.7 41.5 -- -- --  PLT 419* -- 395 384 -- -- --  APTT -- -- -- -- -- -- 33  LABPROT 24.3* -- 22.0* 19.5* -- -- --  INR 2.14* -- 1.89* 1.62* -- -- --  HEPARINUNFRC 0.24* 0.36 0.39 -- -- -- --  CREATININE 1.16 -- -- 1.23 -- -- 1.25  CKTOTAL -- -- -- 18 20 23  --  CKMB -- -- -- 1.4 1.6 1.6 --  TROPONINI -- -- -- <0.30 <0.30 <0.30 --   Estimated Creatinine Clearance: 54.8 ml/min (by C-G formula based on Cr of 1.16).   Medications:  Heparin @ 2100 units/hr    Assessment: 78 yo male with h/o atrial fibrillation. Heparin level is below-goal. No problem with line per RN.    Goal of Therapy:  Heparin level 0.3-0.7 units/ml   Plan:  1. Increase IV heparin to 2300 units/hr. 2. Heparin level in 8 hours.      Otila Back 10/19/2011,6:21 AM

## 2011-10-19 NOTE — Procedures (Signed)
Bronchoscopy Procedure Note- nasal Dean [name] Mcfate [id number] 11-Oct-1923  Procedure: Bronchoscopy Indications: Obtain specimens for culture and/or other diagnostic studies, remove secretions, lung collapse  Procedure Details Consent: Risks of procedure as well as the alternatives and risks of each were explained to the (patient/caregiver).  Consent for procedure obtained. family awar emight need intubation peri procedure Time Out: Verified patient identification, verified procedure, site/side was marked, verified correct patient position, special equipment/implants available, medications/allergies/relevent history reviewed, required imaging and test results available.  Performed  In preparation for procedure, patient was given 100% FiO2, bronchoscope lubricated and lidoviscus pre and spray to cords lido. Sedation: Benzodiazepines  Airway entered and the following bronchi were examined: RUL, RML, RLL, LUL, LLL and Bronchi.   Procedures performed: Brushings performed Bronchoscope removed.  , Patient placed back on 100% FiO2 at conclusion of procedure.    Evaluation Hemodynamic Status: BP stable throughout; O2 sats: stable throughout Patient's Current Condition: stable Specimens:  Sent serosanguinous fluid, bloody Complications: No apparent complications Patient did tolerate procedure well.   1. Complete collapse rt main old thin long blood clot mixed with pus, removed 2. LAVAGE RML, BI cleared 3. Left WNL   Dean [name]. 10/19/2011

## 2011-10-20 ENCOUNTER — Inpatient Hospital Stay (HOSPITAL_COMMUNITY): Payer: Medicare Other

## 2011-10-20 LAB — HEMOGLOBIN A1C
Hgb A1c MFr Bld: 6 % — ABNORMAL HIGH (ref <5.7)
Mean Plasma Glucose: 126 mg/dL — ABNORMAL HIGH (ref <117)

## 2011-10-20 LAB — BODY FLUID CULTURE

## 2011-10-20 LAB — PREPARE FRESH FROZEN PLASMA: Unit division: 0

## 2011-10-20 LAB — EXPECTORATED SPUTUM ASSESSMENT W GRAM STAIN, RFLX TO RESP C

## 2011-10-20 LAB — CBC
Hemoglobin: 11.8 g/dL — ABNORMAL LOW (ref 13.0–17.0)
MCH: 31.6 pg (ref 26.0–34.0)
MCV: 96.3 fL (ref 78.0–100.0)
Platelets: 385 10*3/uL (ref 150–400)
RBC: 3.74 MIL/uL — ABNORMAL LOW (ref 4.22–5.81)
WBC: 23.1 10*3/uL — ABNORMAL HIGH (ref 4.0–10.5)

## 2011-10-20 LAB — BASIC METABOLIC PANEL
BUN: 16 mg/dL (ref 6–23)
Chloride: 103 mEq/L (ref 96–112)
Creatinine, Ser: 1.01 mg/dL (ref 0.50–1.35)
GFR calc Af Amer: 76 mL/min — ABNORMAL LOW (ref >90)
GFR calc non Af Amer: 66 mL/min — ABNORMAL LOW (ref >90)

## 2011-10-20 LAB — GLUCOSE, CAPILLARY: Glucose-Capillary: 111 mg/dL — ABNORMAL HIGH (ref 70–99)

## 2011-10-20 LAB — COMPREHENSIVE METABOLIC PANEL
ALT: 23 U/L (ref 0–53)
AST: 26 U/L (ref 0–37)
Alkaline Phosphatase: 82 U/L (ref 39–117)
CO2: 26 mEq/L (ref 19–32)
Calcium: 8.2 mg/dL — ABNORMAL LOW (ref 8.4–10.5)
Potassium: 3.5 mEq/L (ref 3.5–5.1)
Sodium: 133 mEq/L — ABNORMAL LOW (ref 135–145)
Total Protein: 6.4 g/dL (ref 6.0–8.3)

## 2011-10-20 LAB — BODY FLUID CELL COUNT WITH DIFFERENTIAL
Eos, Fluid: 0 %
Total Nucleated Cell Count, Fluid: 90 cu mm (ref 0–1000)

## 2011-10-20 LAB — GLUCOSE, SEROUS FLUID

## 2011-10-20 LAB — MAGNESIUM: Magnesium: 2.2 mg/dL (ref 1.5–2.5)

## 2011-10-20 LAB — LACTATE DEHYDROGENASE: LDH: 320 U/L — ABNORMAL HIGH (ref 94–250)

## 2011-10-20 LAB — PROTIME-INR: Prothrombin Time: 26.2 seconds — ABNORMAL HIGH (ref 11.6–15.2)

## 2011-10-20 LAB — PROTEIN, BODY FLUID: Total protein, fluid: 3.1 g/dL

## 2011-10-20 LAB — LACTATE DEHYDROGENASE, PLEURAL OR PERITONEAL FLUID: LD, Fluid: 1271 U/L — ABNORMAL HIGH (ref 3–23)

## 2011-10-20 LAB — CULTURE, RESPIRATORY W GRAM STAIN: Gram Stain: NONE SEEN

## 2011-10-20 LAB — HEPARIN LEVEL (UNFRACTIONATED): Heparin Unfractionated: <0.1 IU/mL — ABNORMAL LOW (ref 0.30–0.70)

## 2011-10-20 LAB — CHOLESTEROL, BODY FLUID

## 2011-10-20 LAB — TRIGLYCERIDES, BODY FLUIDS: Triglycerides, Fluid: 39 mg/dL

## 2011-10-20 MED ORDER — SODIUM CHLORIDE 0.9 % IJ SOLN
INTRAMUSCULAR | Status: AC
  Administered 2011-10-20: 14:00:00
  Filled 2011-10-20: qty 10

## 2011-10-20 MED ORDER — FENTANYL CITRATE 0.05 MG/ML IJ SOLN
INTRAMUSCULAR | Status: AC
  Administered 2011-10-20: 50 ug
  Filled 2011-10-20: qty 2

## 2011-10-20 MED ORDER — FUROSEMIDE 10 MG/ML IJ SOLN
INTRAMUSCULAR | Status: AC
  Filled 2011-10-20: qty 4

## 2011-10-20 MED ORDER — MIDAZOLAM HCL 2 MG/2ML IJ SOLN
INTRAMUSCULAR | Status: AC
  Filled 2011-10-20: qty 2

## 2011-10-20 MED ORDER — AMIODARONE HCL IN DEXTROSE 360-4.14 MG/200ML-% IV SOLN
0.5000 mg/min | INTRAVENOUS | Status: DC
  Administered 2011-10-20 – 2011-10-25 (×9): 0.5 mg/min via INTRAVENOUS
  Filled 2011-10-20 (×20): qty 200

## 2011-10-20 MED ORDER — FUROSEMIDE 10 MG/ML IJ SOLN
40.0000 mg | Freq: Three times a day (TID) | INTRAMUSCULAR | Status: DC
  Administered 2011-10-20 – 2011-10-22 (×6): 40 mg via INTRAVENOUS
  Filled 2011-10-20 (×6): qty 4

## 2011-10-20 MED ORDER — AMIODARONE LOAD VIA INFUSION
150.0000 mg | Freq: Once | INTRAVENOUS | Status: AC
  Administered 2011-10-20: 150 mg via INTRAVENOUS
  Filled 2011-10-20: qty 83.34

## 2011-10-20 MED ORDER — AMIODARONE HCL IN DEXTROSE 360-4.14 MG/200ML-% IV SOLN
1.0000 mg/min | INTRAVENOUS | Status: AC
  Administered 2011-10-20: 1 mg/min via INTRAVENOUS
  Filled 2011-10-20: qty 200

## 2011-10-20 NOTE — Procedures (Signed)
Right Chest tube placement Indication: known exudative effusion Consent pt and wife fully aware of risks, benefit chloraprep to 5-6 th ICS. Sterile 1.2 verrticle incisions made, dissection to rib space Punctured space Over 1.3 liters brown clear fluid off Sutured x 2 toleerated well blood loss less 10 cc To suction  [name], MD, [place] Pgr: [place] [facility]

## 2011-10-20 NOTE — Progress Notes (Signed)
PHARMACY - CRITICAL CARE PROGRESS NOTE  Pharmacy Consult: Primaxin & Zyvox dosing Indication: MRSA PNA   Vital Signs: Temp: 98 F (36.7 C) (01/14 0812) Temp src: Oral (01/14 0812) BP: 103/69 mmHg (01/14 0800) Pulse Rate: 113  (01/14 0812) Labs:  Basename 10/20/11 0530 10/19/11 0500 10/18/11 0630  WBC 23.1* 36.5* 39.7*  HGB 11.8* 12.9* 13.8  HCT 36.0* 38.1* 40.7  PLT 385 419* 395  APTT -- -- --  INR 2.36* 2.14* 1.89*  CREATININE 1.01 1.16 --  LABCREA -- -- --  CREATININE 1.01 1.16 --  LABCREA -- -- --  [id number] -- -- --  MG 2.2 -- --  PHOS -- -- --  ALBUMIN -- -- --  PROT -- -- --  AST -- -- --  ALT -- -- --  ALKPHOS -- -- --  BILITOT -- -- --  BILIDIR -- -- --  IBILI -- -- --  HL = 0.38 Vanc trough - 16.7 goal 15-20  Admit Complaint: Patient admitted to [facility] with SOB, fever and progressively worsening cough. Transferred here for further management of PNA, and concerns for possible empyema.  Pharmacist System-Based Medication Review  Anticoag: H/o AFib. Coumadin PTA. INR 2.36 (trending up). No Coumadin since admit on 1/10). Heparin discontinued for now per Dr. [name]. H/H trending down, plts 385. Old blood clot removed on bronch.   Infectious Disease: Zyvox/Primaxin d2 (s/p 9dVanc[dc 1/13]/4dZosyn[dc 1/13] for MRSA PNA, started at OSH at  WBC 23.1 (trending down), Afebrile, MRSA PCR positive. CDiff PCR negative. Resp culture- no info available. SCr improved-no change to doses. F/U renal function.  Cardiovascular: Afib- on dilt, warfarin, zocor, fish oil PTA. Dilt drip here.  BP OK, HR 113 (Afib), BNP 2661. EF 55%. Pt develop RVR last night with hypoxia so tx back to ICU.  Endocrinology: No prior hx DM. CBGs 111-162 on sensitive SSI.   Gastrointestinal / Nutrition: Abd distention.    Neurology: A & O, conversant  Nephrology: No new BMET today. 01/11: Scr 1.01 (improved), est GFR ~62.79ml/min. K 3.9  Pulmonary: Astelin, pulmicort, nasonex PTA. Trying to  avoid intubation. May need thoracentesis in AM  Hematology / Oncology: Hydrxyurea PTA, leukocytosis, H/H trending down- but ok for now.   PTA Medication: Corrected all the meds on medrec  Best Practices: PPI IV, (heparin d/c'd- f/u plan for anticoagulation)  Plan:  1. Continue Zyvox 600mg  IV q12 2. Continue Primaxin 500 mg IV q8 3. Follow-up plan for anticoagulation

## 2011-10-20 NOTE — Progress Notes (Signed)
Pt did not receive bolus dose of amioderone. HR 100's. A-fib. Pt stable.

## 2011-10-20 NOTE — Progress Notes (Signed)
Pt had right pleural chest tube placed around 1615. Pt tolerated procedure well. Vital signs stable. No complications. X-ray done. [place] Suction set at 20.

## 2011-10-20 NOTE — Progress Notes (Signed)
Went into patients room to give neb treatment. Patient became short of breath and c/o pain on his right side. Sp02 went down into the mid 80's and respiratory rate increased to 28-32. Placed patient on BIPAP 13/5 with 100%, spontaneous tidal volumes 781,minute ventilation 14.0. Patient increased work of breathing decreased shortly after bipap started. Notified Dr.Karimova about the situation.

## 2011-10-20 NOTE — Progress Notes (Signed)
Name: Dean [name] MRN: [id number] DOB: [birth year]    LOS: 4  [place] [facility]/Critical Care Progress Note  Subjective - 78 year old male who presented to [place] hospital on 1/2. Dx eval demonstrated MRSA PNA.Had progressive pleural effusion with thoracentesis on 1/8 showing exudative sample. Presented to [facility] on 1/10 for further evaluation of  worsening of Right sided airspace disease, increased work of breathing, AF w/ RVR and progressive leukocytosis.   Overnight events:  On BiPAP overnight, transitioned to 100% NRB this am C/o pain Rt. Chest Occasional tachycardia up to 110 on diltiazem gtt  Lines / Drains:  Cultures: c-diff pcr 1/10>>neg MRSA pcr>> +ve Sputum 1/7>>>MRSA  Influenza PCR1/2>>> Negative BAL 1/13>>>  Antibiotics: primaxin 1/13>>> zyvox 1/13>>> Vanco 1/5>>>1/13 Zosyn 1/10>>>1/13 Levaquin 1/2>>>1/10 Rocephin 1/2>>>1/10   Tests / Events: 1/10 CT Chest: increase in right pleural effusion, mucous plugging 1/10 CT abd: no evidence of bowel obstruction or ileus 1/8: thoracentesis on 1/8 yielding a exudative sample consistent with a parapneumonia effusion.   ECHO 10/17/2010: EF 55%, preserved systolic function. Mild aortic stenosis.  1/10- no distress 1/13 - resp distress, hypoxia, complete right lung collapse. S/p Bronchoscopy (DF)- obstructed rt main with blood clot mixed with pus.   Vital Signs: Temp:  [97.6 F (36.4 C)-99 F (37.2 C)] 98 F (36.7 C) (01/14 0812) Pulse Rate:  [45-141] 101  (01/14 1000) Resp:  [13-25] 16  (01/14 1000) BP: (91-121)/(52-82) 114/59 mmHg (01/14 1000) SpO2:  [83 %-98 %] 95 % (01/14 1000) FiO2 (%):  [50 %-100 %] 100 % (01/14 1000)    Physical Examination: General:  Elderly male resting in bed. Nonrebreather mask Neuro: Alert and oriented, no focal deficits   HEENT: no JVD   Cardiovascular: Irregular, tachycardia Lungs: bronchial breath sounds Abdomen: tense, tender to palpation.. BS active Musculoskeletal: no  edema  Labs and Imaging:   CBC:    Component Value Date/Time   WBC 23.1* 10/20/2011 0530   HGB 11.8* 10/20/2011 0530   HCT 36.0* 10/20/2011 0530   PLT 385 10/20/2011 0530   MCV 96.3 10/20/2011 0530   BMET    Component Value Date/Time   NA 135 10/20/2011 0530   K 3.9 10/20/2011 0530   CL 103 10/20/2011 0530   CO2 25 10/20/2011 0530   GLUCOSE 110* 10/20/2011 0530   BUN 16 10/20/2011 0530   CREATININE 1.01 10/20/2011 0530   CALCIUM 7.8* 10/20/2011 0530   GFRNONAA 66* 10/20/2011 0530   GFRAA 76* 10/20/2011 0530   CXR 1/13: Right lung collapse, right hemithorax completely opacified    Assessment and Plan:  Dyspnea in the setting of MRSA PNA and parapneumonic effusion on the right. Complicated by abdominal distention/ ileus resulting atelectasis, and complete collapse of right lung. Plan: -Bronchoscopy 1/13 showed old thin blood clot with pus in right main, which was removed Post bronch pcxr reveled improved aeration of rt lung -Repeat thoracentesis in AM.  INR 2.36.  2 u FFP now. D/c heparin. Had thora 1/8 only 350 cc likely will continue to collapse rt without thoracentesis therapeutic -AM CXR -Nebs Q12 h with mucomyst -Chest PT NIMV  q4 ho n q4 h off continue this x 24 hrs  Abdominal distention/Ileus. In the setting of recent hospital stay and quinolone therapy Plan: -Improved after BM yesterday -NPO except meds -Bowel regimen if needed  A-fib with RVR  Plan: -Currently on cardizem drip at 15 mcg with occasional tachycardia from pain and respi distress + albuterol - use xopenex instead of albuterol  if need - pain control with morphine and toradol -restart heparin gtt after thoracocentesis - positive 2.6 l /24hrs May need to combine amio at this stage as limited success to remain on cardizem monotherapy, driven by resp failuire  Sepsis- primarily PNA/Parapneumonic effusion, possible colitis -linazolid, imipenem, at risk ESBL new organisms -C-Diff negative -WBC trending down,  afebrile.  - Follow BAL cultures Failure clinically to vanc [place] after bronch results  Urinary retention Plan: foley  GERD -PPI   Best practices / Disposition: -->ICU status under PCCM -->full code -->Heparin drip DVT Px/A-fib- currently off for procedure -->Protonix for GI Px -->diet NPO except meds -->family updated at bedside  likley needs line, assess cvp and use for amio   Patient seen and examined, agree with above note.  I dictated the care and orders written for this patient under my direction. Ccm 30 min  DEVANI,MADHAV, M.D.   [name], MD, [place] Pgr: [place] [facility]

## 2011-10-21 ENCOUNTER — Inpatient Hospital Stay (HOSPITAL_COMMUNITY): Payer: Medicare Other

## 2011-10-21 DIAGNOSIS — J869 Pyothorax without fistula: Secondary | ICD-10-CM

## 2011-10-21 DIAGNOSIS — J9600 Acute respiratory failure, unspecified whether with hypoxia or hypercapnia: Secondary | ICD-10-CM

## 2011-10-21 DIAGNOSIS — J90 Pleural effusion, not elsewhere classified: Secondary | ICD-10-CM

## 2011-10-21 DIAGNOSIS — J13 Pneumonia due to Streptococcus pneumoniae: Secondary | ICD-10-CM

## 2011-10-21 DIAGNOSIS — J9819 Other pulmonary collapse: Secondary | ICD-10-CM

## 2011-10-21 LAB — GLUCOSE, CAPILLARY
Glucose-Capillary: 128 mg/dL — ABNORMAL HIGH (ref 70–99)
Glucose-Capillary: 108 mg/dL — ABNORMAL HIGH (ref 70–99)
Glucose-Capillary: 121 mg/dL — ABNORMAL HIGH (ref 70–99)
Glucose-Capillary: 148 mg/dL — ABNORMAL HIGH (ref 70–99)

## 2011-10-21 LAB — CBC
HCT: 34.3 % — ABNORMAL LOW (ref 39.0–52.0)
MCH: 31.9 pg (ref 26.0–34.0)
MCHC: 33.8 g/dL (ref 30.0–36.0)
MCV: 94.2 fL (ref 78.0–100.0)
RDW: 15.1 % (ref 11.5–15.5)

## 2011-10-21 LAB — POCT I-STAT 3, ART BLOOD GAS (G3+)
Acid-Base Excess: 7 mmol/L — ABNORMAL HIGH (ref 0.0–2.0)
O2 Saturation: 99 %

## 2011-10-21 LAB — BASIC METABOLIC PANEL
BUN: 14 mg/dL (ref 6–23)
CO2: 29 mEq/L (ref 19–32)
Calcium: 8.2 mg/dL — ABNORMAL LOW (ref 8.4–10.5)
Creatinine, Ser: 1.05 mg/dL (ref 0.50–1.35)
Glucose, Bld: 105 mg/dL — ABNORMAL HIGH (ref 70–99)

## 2011-10-21 MED ORDER — POTASSIUM CHLORIDE CRYS ER 20 MEQ PO TBCR
EXTENDED_RELEASE_TABLET | ORAL | Status: AC
  Filled 2011-10-21: qty 2

## 2011-10-21 MED ORDER — FUROSEMIDE 10 MG/ML IJ SOLN
INTRAMUSCULAR | Status: AC
  Filled 2011-10-21: qty 4

## 2011-10-21 MED ORDER — POTASSIUM CHLORIDE CRYS ER 20 MEQ PO TBCR
40.0000 meq | EXTENDED_RELEASE_TABLET | Freq: Once | ORAL | Status: AC
  Administered 2011-10-21: 40 meq via ORAL

## 2011-10-21 NOTE — Progress Notes (Signed)
Name: Dean [name] MRN: [id number] DOB: [birth year]    LOS: 5  [place] [facility]/Critical Care Progress Note  Subjective - 78 year old male who presented to [place] hospital on 1/2. Dx eval demonstrated MRSA PNA.Had progressive pleural effusion with thoracentesis on 1/8 showing exudative sample. Presented to [facility] on 1/10 for further evaluation of  worsening of Right sided airspace disease, increased work of breathing, AF w/ RVR and progressive leukocytosis.   Lines / Drains: Rt chest tube 1/14 >>>  Cultures: c-diff pcr 1/10>>neg MRSA pcr>> +ve Sputum 1/7>>>MRSA  Influenza PCR1/2>>> Negative BAL 1/13>>> Pleural 1/14>>>  Antibiotics: primaxin 1/13>>> zyvox 1/13>>> Vanco 1/5>>>1/13 Zosyn 1/10>>>1/13 Levaquin 1/2>>>1/10 Rocephin 1/2>>>1/10   Tests / Events: 1/10 CT Chest: increase in right pleural effusion, mucous plugging 1/10 CT abd: no evidence of bowel obstruction or ileus 1/8: thoracentesis on 1/8 yielding a exudative sample consistent with a parapneumonia effusion.   ECHO 10/17/2010: EF 55%, preserved systolic function. Mild aortic stenosis.  1/10- no distress 1/13 - resp distress, hypoxia, complete right lung collapse. S/p Bronchoscopy (DF)- obstructed rt main with blood clot mixed with pus.  1/14- prior exudate knonw, increased effusion, chest tube placed, hydro pneurmo improved 1/14 - CT chest - hydro PNA, PNA 1/14- amio strted  Vital Signs: Temp:  [98 F (36.7 C)-100.3 F (37.9 C)] 98 F (36.7 C) (01/15 0824) Pulse Rate:  [72-123] 72  (01/15 0900) Resp:  [14-23] 16  (01/15 0900) BP: (89-135)/(53-88) 107/56 mmHg (01/15 0900) SpO2:  [88 %-99 %] 97 % (01/15 0900) FiO2 (%):  [80 %-100 %] 100 % (01/15 0900) Weight:  [201 lb 1 oz (91.2 kg)] 201 lb 1 oz (91.2 kg) (01/15 0535)    Physical Examination: General:  Elderly male resting in bed. Nonrebreather mask Neuro: Alert and oriented, no focal deficits   HEENT:  Has JVD   Cardiovascular: S1 S2  irt Lungs:  reduced rt , exp mild wheezing Abdomen:  tender to palpation.. BS active Musculoskeletal: no edema  Labs and Imaging:   CBC:    Component Value Date/Time   WBC 18.9* 10/21/2011 0430   HGB 11.6* 10/21/2011 0430   HCT 34.3* 10/21/2011 0430   PLT 389 10/21/2011 0430   MCV 94.2 10/21/2011 0430   BMET    Component Value Date/Time   NA 133* 10/21/2011 0430   K 3.3* 10/21/2011 0430   CL 95* 10/21/2011 0430   CO2 29 10/21/2011 0430   GLUCOSE 105* 10/21/2011 0430   BUN 14 10/21/2011 0430   CREATININE 1.05 10/21/2011 0430   CALCIUM 8.2* 10/21/2011 0430   GFRNONAA 63* 10/21/2011 0430   GFRAA 73* 10/21/2011 0430   CXR 1/14- no ptx, effusion reduxced, ct wnl   Assessment and Plan:  HYDROPNEUMOthorax, exudative effusion, MRSA PNA, r/o empyema Hypoxic resp failure Plan: -chest tube placed to suction, repeat exudative analysis, follow for gram stin and culture - no noted air on pcxr, may have some still with anterior ptx in fluid -CT chest reviewed, likely will need VATS, will consult CVTS -likely this effusion has chronicity as trap down - continue lasix - abg reviewed, reduce o2 needes -maintain a schedule NIMV pcxr in am  No current leak or air bubbles NO heparin as bronch with bloody mucous plug and collapse  Abdominal distention/Ileus. In the setting of recent hospital stay and quinolone therapy Plan: -Improved after BM yesterday -NPO except meds, start clears -Bowel regimen if needed  A-fib with RVR  Plan: -Currently on cardizem drip at 15 mcg  with occasional tachycardia from pain and respi distress + albuterol - use xopenex instead of albuterol if need - pain control with morphine and toradol -no heparin in his near future - improved with amio, attempt to dc cardizem  Sepsis- primarily PNA/Parapneumonic effusion, possible colitis, r/o empyema -linazolid, imipenem, at risk ESBL new organisms, in am if BAL neg, dc imipenem -C-Diff negative -cont linazolid  Urinary  retention Plan: foley  GERD -PPI Start clears  Best practices / Disposition: -->ICU status under PCCM -->full code -->Heparin drip DVT Px/A-fib- currently off as had bloody mucous plug rt main with collapse --> SCD -->Protonix for GI Px -->clears -->family updated at bedside  Place line today   Patient seen and examined, agree with above note.  I dictated the care and orders written for this patient under my direction. Ccm 30 min  DEVANI,MADHAV, M.D.   [name], MD, [name] Pgr: [place] [facility]

## 2011-10-21 NOTE — Progress Notes (Signed)
Spoke with MD regarding expiration of PIVs. MD said it would be ok to keep and use current PIVs.

## 2011-10-21 NOTE — Progress Notes (Signed)
Called IV team to attempt peripheral IV stick. Pt currently has two PIVs that expire today. Central line did not get placed today. IV team unsuccessful with starting a new IV. Will attempt at a later time. Pt resting and comfortable. Current PIVs functioning well.

## 2011-10-21 NOTE — Consult Note (Signed)
[address]            [place] [address]          [phone]       Dean [name] [facility] Medical Record [id number] Date of Birth: [birth year]   Referring physician: Merrie Roof, MD  Reason for consultation:  Right Empyema, MRSA pneumonia   History of Present Illness:      39 yom who presented to [facility] on 1/2 with complaints of progressive abd pain, SOB, cough (non-productive) and chest discomfort after failure to respond to treatment with ABX and prednisone taper dating back to 12/1 for what was initially thought to be nasal discharge and sinus congestion by his PCP.  He denied any significant fever, although he says he did have a fever when he was at the hospital. No chills, body aches, nausea, vomiting, diarrhea. Denies any recent travel or sick contacts. On evaluation at [facility] he was noted to have right- sided PNA by CT scan and was treated with empiric levaquin (started on 1/2) Sputum culture identified MRSA on 1/7, Vanc was added on that day. He underwent thoracentesis on 1/8 yielding a exudative sample consistent with a parapneumonia effusion.  His abdomen remained distended and tender. He developed RVR the pm of 1/9 with associated progression of dyspnea he felt related to gastric distention and discomfort. He presented to [facility] on 1/10 for further evaluation of  worsening of Right sided airspace disease, increased work of breathing, AF w/ RVR and progressive leukocytosis. He developed reaccumulation of the right pleural effusion after thoracentesis and had a right chest tube placed by CCM yesterday. He had drainage of over 1 L of fluid that was exudative. CT scan the chest after chest tube placement showed a moderate to large residual loculated right pleural effusion with compressive atelectasis of the right lung.    A   Past Medical History  Diagnosis Date  . PAF (paroxysmal atrial fibrillation)     followed by  [place] [facility]  . Aortic valve disease     followed by [facility] cardiology  . Prostate cancer     s/p seed implants  . Environmental allergies   . Asthma   . Essential thrombocytopenia     followed by Dr Bobby Rumpf (hematology)  . Neuropathy   . Insomnia   . Gout   . Obesity   . Hypertension   . Hyperlipidemia   . Osteoarthritis   . Hypoxia, sleep related     No past surgical history on file.  History  Smoking status  . Never Smoker   Smokeless tobacco  . Not on file    History  Alcohol Use: Not on file    History   Social History  . Marital Status: Married    Spouse Name: N/A    Number of Children: N/A  . Years of Education: N/A   Occupational History  . 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 Smoking status: Never Smoker   . Smokeless tobacco: Not on file  . Alcohol Use: Not on file  . Drug Use: Not on file  . Sexually Active: Not on file   Other Topics Concern  . Not on file   Social History Narrative  . No narrative on file    No Known Allergies  Current Facility-Administered Medications  Medication Dose Route Frequency Provider Last Rate Last Dose  . 0.9 %  sodium chloride infusion   Intravenous Continuous Madhav Devani 10 mL/hr at 10/20/11 1340    . acetaminophen (TYLENOL) tablet 650 mg  650 mg Oral Q6H PRN Marni Griffon, NP   650 mg at 10/16/11 2255  . acetylcysteine (MUCOMYST) 20 % nebulizer solution 4 mL  4 mL Nebulization BID [name] [name]   4 mL at 10/21/11 0818  . amiodarone (NEXTERONE PREMIX) 360 mg/200 mL dextrose IV infusion  1 mg/min Intravenous Continuous Madhav Devani 33.3 mL/hr at 10/20/11 1504 1 mg/min at 10/20/11 1504   And  . amiodarone (NEXTERONE PREMIX) 360 mg/200 mL dextrose IV infusion  0.5 mg/min Intravenous Continuous Madhav Devani 16.7 mL/hr at 10/21/11 0908 0.5 mg/min at 10/21/11 0908  . diltiazem (CARDIZEM) 100 mg in dextrose 5 % 100 mL infusion  5-15 mg/hr  Intravenous Titrated Dianne Dun 12 mL/hr at 10/21/11 0726 12 mg/hr at 10/21/11 0726  . furosemide (LASIX) 10 MG/ML injection           . furosemide (LASIX) 10 MG/ML injection           . furosemide (LASIX) 10 MG/ML injection           . furosemide (LASIX) injection 40 mg  40 mg Intravenous Q8H Madhav Devani   40 mg at 10/21/11 1400  . hydroxyurea (HYDREA) capsule 500 mg  500 mg Oral QODAY Jeffrey T McClung   500 mg at 10/20/11 1100  . imipenem-cilastatin (PRIMAXIN) 500 mg in sodium chloride 0.9 % 100 mL IVPB  500 mg Intravenous Q8H Daniel J. Feinstein   500 mg at 10/21/11 1400  . insulin aspart (novoLOG) injection 0-9 Units  0-9 Units Subcutaneous Q4H Kimberlee Nearing McClung   1 Units at 10/21/11 1700  . levalbuterol (XOPENEX) nebulizer solution 0.63 mg  0.63 mg Nebulization Q6H Jeffrey T McClung   0.63 mg at 10/21/11 1321  . linezolid (ZYVOX) IVPB 600 mg  600 mg Intravenous Q12H Daniel J. Feinstein   600 mg at 10/21/11 0907  . midazolam (VERSED) 2 MG/2ML injection           . morphine 2 MG/ML injection 1-2 mg  1-2 mg Intravenous Q3H PRN Dellis Filbert T McClung   1 mg at 10/21/11 1709  . oxyCODONE (Oxy IR/ROXICODONE) immediate release tablet 5-10 mg  5-10 mg Oral Q4H PRN Dellis Filbert T McClung   10 mg at 10/19/11 1038  . pantoprazole (PROTONIX) injection 40 mg  40 mg Intravenous Q24H Jeffrey T McClung   40 mg at 10/20/11 2140  . potassium chloride SA (K-DUR,KLOR-CON) 20 MEQ CR tablet           . potassium chloride SA (K-DUR,KLOR-CON) CR tablet 40 mEq  40 mEq Oral Once Elnora Morrison, MD   40 mEq at 10/21/11 1019  . Tamsulosin HCl (FLOMAX) capsule 0.4 mg  0.4 mg Oral QPC supper Kimberlee Nearing McClung   0.4 mg at 10/21/11 1819     Family History  Problem Relation Age of Onset  . Asthma Maternal Grandmother   . Tuberculosis Maternal Grandmother   . Heart disease Father     died of heart disease  . Alzheimer's disease Mother      Review of Systems:     Cardiac Review of Systems: Y or N  Chest Pain [  y  ]  Resting SOB Blue.Reese  ] Exertional SOB  [ y ]  Vertell Limber Blue.Reese ]  Pedal Edema Florencio.Farrier  ]    Palpitations [ n ] Syncope  [n  ]  Presyncope [n ]   General Review of Systems: [Y] = yes [  ]=no  Constitional: recent weight change [n]; anorexia [ n ]; fatigue [ y ]; nausea [n ]; night sweats [n  ]; fever Blue.Reese  ]; or chills [ y ];                                                                                                                                          Dental: poor dentition[n  ]; y   Eye : blurred vision [n ]; diplopia Florencio.Farrier ]; vision changes [ n ];  Amaurosis fugax[ n];  Resp: cough Blue.Reese  ];  wheezing[ n ];  hemoptysis[ n ]; shortness of breath[ y ]; paroxysmal nocturnal dyspnea[ n ]; dyspnea on exertion[ y]; or orthopnea[ y ];   GI:  gallstones[ n], vomiting[ n ];  dysphagia[n]; melena[ n ];  hematochezia [n ]; heartburn[ n ];   Hx of  Colonoscopy[  n];  GU: kidney stones [n  ]; hematuria[n  ];   dysuria [n ];  nocturia[  n];  history of     obstruction [n  ];              Skin: rash, swelling[ n ];, hair loss[n];  peripheral edema[n];  or itching[ n ];  Musculosketetal: myalgias[ n ];  joint swelling[ n ];  joint erythema[ n ];  joint pain[ n ];  back pain[ n ];   Heme/Lymph: bruising[ n ];  bleeding[ n;  anemia[ n ];   Neuro: TIA[ n ];  headaches[  n];  stroke[  n];  vertigo[ n ];  seizures[ n ];   paresthesias[  n];  difficulty walking[n  ];   Psych:depression[ n]; anxiety[ n ];   Endocrine: diabetes[  n  thyroid dysfunction[ n ]   Immunizations: Flu [ n ]; Pneumococcal[ n ];   Other:  Physical Exam: BP 109/60  Pulse 75  Temp(Src) 98.2 F (36.8 C) (Oral)  Resp 15  Ht 5\' 11"  (1.803 m)  Wt 91.2 kg (201 lb 1 oz)  BMI 28.04 kg/m2  SpO2 94%  He is an elderly, chronically ill-appearing white male in no distress. HEENT: Normocephalic and atraumatic. Pupils are equal and reactive to light. Extraocular muscles are intact. Oropharynx is clear. Neck: Carotid pulses are palpable  bilaterally. There is a transmitted murmur to both sides of his neck. There is no cervical or supraclavicular adenopathy. Lungs: Bilateral wheezing worse on the right than the left. Decreased breath sounds over the right lower lobe. Heart: Regular rate and rhythm with a grade 2/6 systolic murmur over the aorta. Abdomen: Bowel sounds are present. Abdomen is soft, mildly distended, nontender. There are no palpable masses or organomegaly. Extremities: Mild peripheral edema in the legs bilaterally. Pedal  pulses are palpable bilaterally. Neurological: Alert and oriented x3. Motor and sensory exams are grossly normal.   Diagnostic Studies & Laboratory data:     Recent Radiology Findings:   Ct Chest Wo Contrast  10/20/2011  *RADIOLOGY REPORT*  Clinical Data: Chest tube placement.  CT CHEST WITHOUT CONTRAST  Technique:  Multidetector CT imaging of the chest was performed following the standard protocol without IV contrast.  Comparison: Chest CT 10/16/2011.  Findings: The right-sided chest tube is in the posterior aspect of the pleural space with its tip in the apex.  There is a small hydropneumothorax.  A small left-sided pleural effusion is also noted.  A small amount of subcutaneous emphysema is demonstrated.  The heart is normal in size.  Stable dense coronary artery calcifications.  No pericardial effusion.  Stable scattered mediastinal and hilar lymph nodes.  The left lung remains clear except for atelectasis overlying the effusion.  The upper abdomen is unremarkable.  IMPRESSION:  1.  Right-sided chest tube in place.  There is a moderate sized persistent hydropneumothorax. 2.  Small left-sided pleural effusion with overlying atelectasis. 3.  Small amount of subcutaneous emphysema.  Original Report Authenticated By: P. Kalman Jewels, M.D.   Dg Chest Port 1 View  10/21/2011  *RADIOLOGY REPORT*  Clinical Data: Respiratory failure.  PORTABLE CHEST - 1 VIEW  Comparison: Multiple priors, most recently  portable chest x-ray 10/20/2011.  Findings: Compared to recent prior examination, position of right- sided chest tube is very similar, with tip near the apex of the right hemithorax.  There continues to be bibasilar opacities (right greater than left), which may reflect areas of atelectasis and/or consolidation.  Blunting of the left costophrenic sulcus is noted, consistent with a small left-sided pleural effusion.  Crowding of vascular markings, interstitial prominence and patchy airspace disease throughout the right lung (particularly perihilar) is similar to the prior study.  Heart size is mildly enlarged. Mediastinal contours appear slightly widened, likely distorted by patient positioning, but are similar to prior.  Atherosclerotic calcifications are noted in the arch of the aorta.  Small amount of subcutaneous gas is again noted in the soft tissues overlying the lower right chest.  IMPRESSION: No significant interval change in the radiographic appearance of the chest, as detailed above. Appearance again likely reflects multilobar pneumonia (most severely involving the right lung), with associated bilateral effusions (review of medical record indicates that this right-sided pleural effusion was exudative).  Original Report Authenticated By: Etheleen Mayhew, M.D.   Dg Chest Port 1 View  10/20/2011  *RADIOLOGY REPORT*  Clinical Data: Chest tube placement.  PORTABLE CHEST - 1 VIEW  Comparison: Chest x-ray same date.  Findings: The right-sided chest tube is in good position. Subsequent much improved aeration of the right lung and no obvious persistent pneumothorax.  The left lung remains clear. Persistent right effusion.  IMPRESSION: Right-sided chest tube in good position with improved right lung aeration.  No definite right-sided pneumothorax.  Original Report Authenticated By: P. Kalman Jewels, M.D.   Dg Chest Port 1 View  10/20/2011  *RADIOLOGY REPORT*  Clinical Data: Right chest tube placement   PORTABLE CHEST - 1 VIEW  Comparison: Same day  Findings: Right chest tube is in place.  There is much less pleural fluid density on the right.  There is some pleural air in the space evacuated of fluid.  The right lung has not re-expanded in any significant fashion.  The left chest remains clear.  IMPRESSION: Right chest tube placement with  evacuation of pleural fluid.  Some pleural air.  Poor re-expansion of the right lung at this time.  Original Report Authenticated By: Jules Schick, M.D.      Recent Lab Findings: Lab Results  Component Value Date   WBC 18.9* 10/21/2011   HGB 11.6* 10/21/2011   HCT 34.3* 10/21/2011   PLT 389 10/21/2011   GLUCOSE 105* 10/21/2011   ALT 23 10/20/2011   AST 26 10/20/2011   NA 133* 10/21/2011   K 3.3* 10/21/2011   CL 95* 10/21/2011   CREATININE 1.05 10/21/2011   BUN 14 10/21/2011   CO2 29 10/21/2011   INR 1.97* 10/21/2011   HGBA1C 6.0* 10/20/2011    Results for orders placed during the hospital encounter of 10/16/11  MRSA PCR SCREENING     Status: Abnormal   Collection Time   10/16/11 12:37 PM      Component Value Range Status Comment   MRSA by PCR POSITIVE (*) NEGATIVE  Final   CLOSTRIDIUM DIFFICILE BY PCR     Status: Normal   Collection Time   10/16/11 10:15 PM      Component Value Range Status Comment   C difficile by pcr NEGATIVE  NEGATIVE  Final   CULTURE, RESPIRATORY     Status: Normal (Preliminary result)   Collection Time   10/19/11  4:10 PM      Component Value Range Status Comment   Specimen Description TRACHEAL ASPIRATE   Final    Special Requests NONE   Final    Gram Stain     Final    Value: FEW WBC PRESENT, PREDOMINANTLY PMN     NO SQUAMOUS EPITHELIAL CELLS SEEN     NO ORGANISMS SEEN   Culture     Final    Value: FEW STAPHYLOCOCCUS AUREUS     Note: RIFAMPIN AND GENTAMICIN SHOULD NOT BE USED AS SINGLE DRUGS FOR TREATMENT OF STAPH INFECTIONS.   Report Status PENDING   Incomplete   BODY FLUID CULTURE     Status: Normal (Preliminary result)    Collection Time   10/20/11  5:17 PM      Component Value Range Status Comment   Specimen Description FLUID PLEURAL   Final    Special Requests ONE CUP @ 20CC   Final    Gram Stain     Final    Value: NO WBC SEEN     NO SQUAMOUS EPITHELIAL CELLS SEEN     NO ORGANISMS SEEN   Culture NO GROWTH   Final    Report Status PENDING   Incomplete   PATHOLOGIST SMEAR REVIEW     Status: Normal   Collection Time   10/20/11  5:17 PM      Component Value Range Status Comment   Tech Review PREDOMINANTLY RBCs.   Final   CULTURE, SPUTUM-ASSESSMENT     Status: Normal   Collection Time   10/20/11  5:50 PM      Component Value Range Status Comment   Specimen Description SPUTUM   Final    Special Requests NONE   Final    Sputum evaluation     Final    Value: THIS SPECIMEN IS ACCEPTABLE. RESPIRATORY CULTURE REPORT TO FOLLOW.   Report Status 10/20/2011 FINAL   Final   CULTURE, RESPIRATORY     Status: Normal (Preliminary result)   Collection Time   10/20/11  5:50 PM      Component Value Range Status Comment   Specimen Description SPUTUM  Final    Special Requests NONE   Final    Gram Stain     Final    Value: NO WBC SEEN     RARE SQUAMOUS EPITHELIAL CELLS PRESENT     NO ORGANISMS SEEN   Culture NO GROWTH   Final    Report Status PENDING   Incomplete      Assessment / Plan:      He has a persistent, moderate to large loculated right pleural fluid collection suggesting empyema status post insertion of a right chest tube. He has significant compressive atelectasis of the right lung. He has presumed MRSA pneumonia. I think he will require surgical drainage of the empyema and decortication the lung. This may be suitable for a VATS approach but would likely require a small thoracotomy. I think this would give him the best chance of reexpanding the right lung, resolving his pneumonia, and recovering. It is unclear whether he has significant aortic stenosis. He said he has been followed by Dr. Bettina Gavia in  [place] with periodic echocardiograms but does not think he has had one in the last year. It would probably be worthwhile doing an echocardiogram preoperatively to be sure that he does not have severe aortic stenosis that would complicate surgery. This will also allow assessment of his left ventricular function. I discussed all this with the patient and will discuss with his family tomorrow. I would tentatively plan to do surgery on Thursday.      @me1 @ 10/21/2011 7:24 PM

## 2011-10-21 NOTE — Progress Notes (Signed)
Patient is taking a break from the BiPAP at this time. Currently on a NRB sats are 96%. No complications noted.

## 2011-10-22 ENCOUNTER — Inpatient Hospital Stay (HOSPITAL_COMMUNITY): Payer: Medicare Other

## 2011-10-22 DIAGNOSIS — J9819 Other pulmonary collapse: Secondary | ICD-10-CM

## 2011-10-22 DIAGNOSIS — I359 Nonrheumatic aortic valve disorder, unspecified: Secondary | ICD-10-CM

## 2011-10-22 DIAGNOSIS — J90 Pleural effusion, not elsewhere classified: Secondary | ICD-10-CM

## 2011-10-22 DIAGNOSIS — J869 Pyothorax without fistula: Secondary | ICD-10-CM

## 2011-10-22 DIAGNOSIS — J9600 Acute respiratory failure, unspecified whether with hypoxia or hypercapnia: Secondary | ICD-10-CM

## 2011-10-22 DIAGNOSIS — J13 Pneumonia due to Streptococcus pneumoniae: Secondary | ICD-10-CM

## 2011-10-22 LAB — BASIC METABOLIC PANEL
CO2: 36 mEq/L — ABNORMAL HIGH (ref 19–32)
Calcium: 8.3 mg/dL — ABNORMAL LOW (ref 8.4–10.5)
GFR calc non Af Amer: 58 mL/min — ABNORMAL LOW (ref >90)
Glucose, Bld: 112 mg/dL — ABNORMAL HIGH (ref 70–99)
Potassium: 3.5 mEq/L (ref 3.5–5.1)
Sodium: 133 mEq/L — ABNORMAL LOW (ref 135–145)

## 2011-10-22 LAB — CBC
HCT: 34.8 % — ABNORMAL LOW (ref 39.0–52.0)
MCV: 93.5 fL (ref 78.0–100.0)
Platelets: 359 10*3/uL (ref 150–400)
RBC: 3.72 MIL/uL — ABNORMAL LOW (ref 4.22–5.81)
RDW: 14.8 % (ref 11.5–15.5)
WBC: 13.7 10*3/uL — ABNORMAL HIGH (ref 4.0–10.5)

## 2011-10-22 LAB — GLUCOSE, CAPILLARY
Glucose-Capillary: 131 mg/dL — ABNORMAL HIGH (ref 70–99)
Glucose-Capillary: 110 mg/dL — ABNORMAL HIGH (ref 70–99)
Glucose-Capillary: 154 mg/dL — ABNORMAL HIGH (ref 70–99)

## 2011-10-22 LAB — PROTIME-INR: INR: 2.2 — ABNORMAL HIGH (ref 0.00–1.49)

## 2011-10-22 MED ORDER — FENTANYL CITRATE 0.05 MG/ML IJ SOLN: 50.0000 ug | INTRAMUSCULAR | Status: DC | PRN

## 2011-10-22 MED ORDER — FUROSEMIDE 10 MG/ML IJ SOLN
40.0000 mg | Freq: Every day | INTRAMUSCULAR | Status: DC
  Filled 2011-10-22: qty 4

## 2011-10-22 MED ORDER — MIDAZOLAM HCL 2 MG/2ML IJ SOLN: 1.0000 mg | INTRAMUSCULAR | Status: DC | PRN

## 2011-10-22 NOTE — Progress Notes (Signed)
Name: Dean [name] MRN: [id number] DOB: Dec 09, 1923    LOS: 6  [place] [facility] Progress Note  Subjective - 78 year old male who presented to [place] hospital on 1/2. Dx eval demonstrated MRSA PNA.Had progressive pleural effusion with thoracentesis on 1/8 showing exudative sample. Presented to [facility] 1/10 for further evaluation of  worsening of Right sided airspace disease, increased work of breathing, AF w/ RVR and progressive leukocytosis.   Lines / Drains: Rt chest tube 1/14 >>>  Cultures: c-diff pcr 1/10>>neg MRSA pcr>> +ve Sputum 1/7>>>MRSA  Influenza PCR1/2>>> Negative BAL 1/13>>>MRSA Pleural 1/14>>>  Antibiotics: primaxin 1/13>>>1/16 zyvox 1/13>>> Vanco 1/5>>>1/13 Zosyn 1/10>>>1/13 Levaquin 1/2>>>1/10 Rocephin 1/2>>>1/10   Tests / Events: 1/10 CT Chest: increase in right pleural effusion, mucous plugging 1/10 CT abd: no evidence of bowel obstruction or ileus 1/8: thoracentesis on 1/8 yielding a exudative sample consistent with a parapneumonia effusion.   ECHO 10/17/2010: EF 55%, preserved systolic function. Mild aortic stenosis.  1/10- no distress 1/13 - resp distress, hypoxia, complete right lung collapse. S/p Bronchoscopy (DF)- obstructed rt main with blood clot mixed with pus.  1/14- prior exudate knonw, increased effusion, chest tube placed, hydro pneurmo improved 1/14 - CT chest - hydro PNA, PNA 1/14- amio strted 1/15- improved Rt lung aeration post Chest tube placement 1/16- cardizem drip stopped, breathing unchanged, gross tolerated neg balance, no distress  Vital Signs: Temp:  [97.9 F (36.6 C)-98.2 F (36.8 C)] 98 F (36.7 C) (01/16 0700) Pulse Rate:  [73-89] 78  (01/16 0800) Resp:  [14-21] 14  (01/16 0800) BP: (103-122)/(56-72) 111/70 mmHg (01/16 0800) SpO2:  [90 %-99 %] 94 % (01/16 0806) FiO2 (%):  [50 %-80 %] 50 % (01/16 0806) Weight:  [192 lb 3.9 oz (87.2 kg)] 192 lb 3.9 oz (87.2 kg) (01/16 0500)    Physical  Examination: General:  Elderly male resting in bed. Nonrebreather mask Neuro: Alert and oriented, no focal deficits   HEENT:  Supple,JVD Cardiovascular: S1 S2 , RRR Respic: reduced right, no wheezing Abdomen:  tender to palpation.. BS active Musculoskeletal: no edema  Labs and Imaging:   CBC:    Component Value Date/Time   WBC 13.7* 10/22/2011 0425   HGB 11.9* 10/22/2011 0425   HCT 34.8* 10/22/2011 0425   PLT 359 10/22/2011 0425   MCV 93.5 10/22/2011 0425   BMET    Component Value Date/Time   NA 133* 10/22/2011 0425   K 3.5 10/22/2011 0425   CL 93* 10/22/2011 0425   CO2 36* 10/22/2011 0425   GLUCOSE 112* 10/22/2011 0425   BUN 13 10/22/2011 0425   CREATININE 1.12 10/22/2011 0425   CALCIUM 8.3* 10/22/2011 0425   GFRNONAA 58* 10/22/2011 0425   GFRAA 67* 10/22/2011 0425   CXR 1/16- unchanged, small Rt apical pneumothorax.    Assessment and Plan:  HYDROPNEUMOthorax, exudative effusion, MRSA PNA, r/o empyema Hypoxic resp failure Plan: -chest tube placed to suction,follow for gram stin and culture -CVTS considering VATS 1/17. Need pre-op echo. - -likely this effusion has chronicity as trap down - reduce lasix- hyponatremia, hypochloremia, creat up. Neg net balance - abg reviewed. Reduce lasix -contraction alkalosis. Reduce O2 to daily -prn NIMV, not needed scheduled -pcxr in am  -NO heparin as bronch with bloody mucous plug and collapse  Abdominal distention/Ileus. In the setting of recent hospital stay and quinolone therapy Plan: -improved -NPO except meds, start clears today -Bowel regimen if needed  A-fib with RVR  Plan: -NSR currently -on amio drip. cardizem turned  off -no heparin in his near future  Sepsis- primarily PNA/Parapneumonic effusion, possible colitis, r/o empyema -linazolid No new gram neg isolated, dc imipenem - BAL positive for MRSA  Urinary retention Plan: foley  GERD -PPI Start clears  Best practices / Disposition: -->ICU status under  PCCM -->full code --> SCD -->Protonix for GI Px -->npo after midnight for VATS 1/17 -->family updated at bedside  Place line today   Patient seen and examined, agree with above note.  I dictated the care and orders written for this patient under my direct care.  Lester Makoti, M.D.   [name], MD, Center Pgr: [place] [facility]

## 2011-10-22 NOTE — Progress Notes (Signed)
Procedure(s) (LRB): VIDEO ASSISTED THORACOSCOPY (VATS)/DECORTICATION (Right) Subjective: No complaints  Objective: Vital signs in last 24 hours: Temp:  [97.9 F (36.6 C)-98.3 F (36.8 C)] 98.3 F (36.8 C) (01/16 1150) Pulse Rate:  [74-89] 84  (01/16 1100) Cardiac Rhythm:  [-] Normal sinus rhythm (01/16 0800) Resp:  [13-20] 17  (01/16 1100) BP: (99-122)/(43-72) 99/43 mmHg (01/16 1100) SpO2:  [90 %-98 %] 95 % (01/16 1100) FiO2 (%):  [50 %-80 %] 50 % (01/16 0806) Weight:  [87.2 kg (192 lb 3.9 oz)] 87.2 kg (192 lb 3.9 oz) (01/16 0500)  Hemodynamic parameters for last 24 hours:    Intake/Output from previous day: 01/15 0701 - 01/16 0700 In: 2881.4 [P.O.:1320; I.V.:639.4; IV Piggyback:922] Out: [id number] [Urine:4785; Chest Tube:55] Intake/Output this shift: Total I/O In: 356.7 [I.V.:56.7; IV Piggyback:300] Out: 2300 [Urine:2300]  Heart: regular rate and rhythm, 3/6 systolic murmur over aorta Lungs: diminished breath sounds RLL  Lab Results:  Basename 10/22/11 0425 10/21/11 0430  WBC 13.7* 18.9*  HGB 11.9* 11.6*  HCT 34.8* 34.3*  PLT 359 389   BMET:  Basename 10/22/11 0425 10/21/11 0430  NA 133* 133*  K 3.5 3.3*  CL 93* 95*  CO2 36* 29  GLUCOSE 112* 105*  BUN 13 14  CREATININE 1.12 1.05  CALCIUM 8.3* 8.2*    PT/INR:  Basename 10/22/11 0425  LABPROT 24.8*  INR 2.20*   ABG    Component Value Date/Time   PHART 7.474* 10/21/2011 0415   HCO3 31.2* 10/21/2011 0415   TCO2 32 10/21/2011 0415   ACIDBASEDEF 1.0 10/19/2011 1611   O2SAT 99.0 10/21/2011 0415   CBG (last 3)   Basename 10/22/11 1149 10/22/11 0740 10/22/11 0341  GLUCAP 154* 119* 110*   Results for orders placed during the hospital encounter of 10/16/11  MRSA PCR SCREENING     Status: Abnormal   Collection Time   10/16/11 12:37 PM      Component Value Range Status Comment   MRSA by PCR POSITIVE (*) NEGATIVE  Final   CLOSTRIDIUM DIFFICILE BY PCR     Status: Normal   Collection Time   10/16/11 10:15 PM     Component Value Range Status Comment   C difficile by pcr NEGATIVE  NEGATIVE  Final   CULTURE, RESPIRATORY     Status: Normal   Collection Time   10/19/11  4:10 PM      Component Value Range Status Comment   Specimen Description TRACHEAL ASPIRATE   Final    Special Requests NONE   Final    Gram Stain     Final    Value: FEW WBC PRESENT, PREDOMINANTLY PMN     NO SQUAMOUS EPITHELIAL CELLS SEEN     NO ORGANISMS SEEN   Culture     Final    Value: FEW METHICILLIN RESISTANT STAPHYLOCOCCUS AUREUS     Note: RIFAMPIN AND GENTAMICIN SHOULD NOT BE USED AS SINGLE DRUGS FOR TREATMENT OF STAPH INFECTIONS. This organism DOES NOT demonstrate inducible Clindamycin resistance in vitro. CRITICAL RESULT CALLED TO, READ BACK BY AND VERIFIED WITH: KARIE M@8 :40AM ON      10/22/11 BY DANTS   Report Status 10/22/2011 FINAL   Final    Organism ID, Bacteria METHICILLIN RESISTANT STAPHYLOCOCCUS AUREUS   Final   BODY FLUID CULTURE     Status: Normal (Preliminary result)   Collection Time   10/20/11  5:17 PM      Component Value Range Status Comment   Specimen Description FLUID PLEURAL  Final    Special Requests ONE CUP @ 20CC   Final    Gram Stain     Final    Value: NO WBC SEEN     NO SQUAMOUS EPITHELIAL CELLS SEEN     NO ORGANISMS SEEN   Culture NO GROWTH 1 DAY   Final    Report Status PENDING   Incomplete   PATHOLOGIST SMEAR REVIEW     Status: Normal   Collection Time   10/20/11  5:17 PM      Component Value Range Status Comment   Tech Review PREDOMINANTLY RBCs.   Final   CULTURE, SPUTUM-ASSESSMENT     Status: Normal   Collection Time   10/20/11  5:50 PM      Component Value Range Status Comment   Specimen Description SPUTUM   Final    Special Requests NONE   Final    Sputum evaluation     Final    Value: THIS SPECIMEN IS ACCEPTABLE. RESPIRATORY CULTURE REPORT TO FOLLOW.   Report Status 10/20/2011 FINAL   Final   CULTURE, RESPIRATORY     Status: Normal   Collection Time   10/20/11  5:50 PM       Component Value Range Status Comment   Specimen Description SPUTUM   Final    Special Requests NONE   Final    Gram Stain     Final    Value: NO WBC SEEN     RARE SQUAMOUS EPITHELIAL CELLS PRESENT     NO ORGANISMS SEEN   Culture     Final    Value: FEW STAPHYLOCOCCUS AUREUS     Note: SUSCEPTIBILITIES PERFORMED ON PREVIOUS CULTURE WITHIN THE LAST 5 DAYS.     MODERATE CANDIDA ALBICANS   Report Status 10/22/2011 FINAL   Final     Assessment/Plan: S/P Procedure(s) (LRB): VIDEO ASSISTED THORACOSCOPY (VATS)/DECORTICATION (Right) Probable MRSA pneumonia with right empyema.  Plan right VATS or thoracotomy tomorrow for complete drainage.  I discussed the procedure, alternatives, benefits and risks with his wife and daughter and they agree to proceed in am.   LOS: 6 days    Gilford Raid K 10/22/2011

## 2011-10-22 NOTE — Progress Notes (Signed)
  Echocardiogram 2D Echocardiogram has been performed.  Isaac Bliss 10/22/2011, 2:51 PM

## 2011-10-22 NOTE — Procedures (Signed)
Central Venous Catheter Insertion Procedure Notedushon kanu Wrench [id number] 1924-09-12  Procedure: Insertion of Central Venous Catheter Indications: Drug and/or fluid administration  Procedure Details Consent: Risks of procedure as well as the alternatives and risks of each were explained to the (patient/caregiver).  Consent for procedure obtained. Time Out: Verified patient identification, verified procedure, site/side was marked, verified correct patient position, special equipment/implants available, medications/allergies/relevent history reviewed, required imaging and test results available.  Performed  Maximum sterile technique was used including antiseptics, cap, gloves, gown, hand hygiene, mask and sheet. Skin prep: Chlorhexidine; local anesthetic administered A antimicrobial bonded/coated triple lumen catheter was placed in the right internal jugular vein using the Seldinger technique.  Evaluation Blood flow good Complications: No apparent complications Patient did tolerate procedure well. Chest X-ray ordered to verify placement.  CXR: pending.  US  guidance  Raylene Miyamoto. 10/22/2011, 4:01 PM

## 2011-10-23 ENCOUNTER — Inpatient Hospital Stay (HOSPITAL_COMMUNITY): Payer: Medicare Other

## 2011-10-23 ENCOUNTER — Inpatient Hospital Stay (HOSPITAL_COMMUNITY): Payer: Medicare Other | Admitting: Anesthesiology

## 2011-10-23 ENCOUNTER — Encounter (HOSPITAL_COMMUNITY): Payer: Self-pay | Admitting: Anesthesiology

## 2011-10-23 ENCOUNTER — Encounter (HOSPITAL_COMMUNITY)
Admission: AD | Disposition: A | Discharge status: Rehabilitation Facility | Payer: Self-pay | Source: Other Acute Inpatient Hospital | Attending: Internal Medicine

## 2011-10-23 DIAGNOSIS — J15212 Pneumonia due to Methicillin resistant Staphylococcus aureus: Secondary | ICD-10-CM

## 2011-10-23 DIAGNOSIS — J869 Pyothorax without fistula: Secondary | ICD-10-CM

## 2011-10-23 DIAGNOSIS — J9600 Acute respiratory failure, unspecified whether with hypoxia or hypercapnia: Secondary | ICD-10-CM

## 2011-10-23 LAB — BLOOD GAS, ARTERIAL
Acid-Base Excess: 12.3 mmol/L — ABNORMAL HIGH (ref 0.0–2.0)
Drawn by: 155271
FIO2: 0.5 %
O2 Saturation: 97.6 %
Patient temperature: 98.6
TCO2: 38.3 mmol/L (ref 0–100)
pCO2 arterial: 50.3 mmHg — ABNORMAL HIGH (ref 35.0–45.0)

## 2011-10-23 LAB — BASIC METABOLIC PANEL
BUN: 11 mg/dL (ref 6–23)
CO2: 35 mEq/L — ABNORMAL HIGH (ref 19–32)
CO2: 33 mEq/L — ABNORMAL HIGH (ref 19–32)
Calcium: 8 mg/dL — ABNORMAL LOW (ref 8.4–10.5)
Calcium: 7.9 mg/dL — ABNORMAL LOW (ref 8.4–10.5)
Chloride: 93 mEq/L — ABNORMAL LOW (ref 96–112)
Creatinine, Ser: 0.91 mg/dL (ref 0.50–1.35)
GFR calc Af Amer: 87 mL/min — ABNORMAL LOW (ref >90)
GFR calc non Af Amer: 75 mL/min — ABNORMAL LOW (ref >90)
GFR calc non Af Amer: 80 mL/min — ABNORMAL LOW (ref >90)
Glucose, Bld: 115 mg/dL — ABNORMAL HIGH (ref 70–99)
Glucose, Bld: 148 mg/dL — ABNORMAL HIGH (ref 70–99)
Potassium: 3 mEq/L — ABNORMAL LOW (ref 3.5–5.1)
Potassium: 3.6 mEq/L (ref 3.5–5.1)
Sodium: 134 mEq/L — ABNORMAL LOW (ref 135–145)
Sodium: 135 mEq/L (ref 135–145)

## 2011-10-23 LAB — CBC
HCT: 35.5 % — ABNORMAL LOW (ref 39.0–52.0)
Hemoglobin: 11.7 g/dL — ABNORMAL LOW (ref 13.0–17.0)
Hemoglobin: 12.4 g/dL — ABNORMAL LOW (ref 13.0–17.0)
MCH: 31.6 pg (ref 26.0–34.0)
MCH: 32.5 pg (ref 26.0–34.0)
MCHC: 33 g/dL (ref 30.0–36.0)
MCV: 95.9 fL (ref 78.0–100.0)
MCV: 94.8 fL (ref 78.0–100.0)
Platelets: 367 10*3/uL (ref 150–400)
Platelets: 366 10*3/uL (ref 150–400)
RBC: 3.7 MIL/uL — ABNORMAL LOW (ref 4.22–5.81)
RBC: 3.81 MIL/uL — ABNORMAL LOW (ref 4.22–5.81)
RDW: 15 % (ref 11.5–15.5)
WBC: 10.3 10*3/uL (ref 4.0–10.5)
WBC: 14.6 10*3/uL — ABNORMAL HIGH (ref 4.0–10.5)

## 2011-10-23 LAB — GLUCOSE, CAPILLARY
Glucose-Capillary: 120 mg/dL — ABNORMAL HIGH (ref 70–99)
Glucose-Capillary: 130 mg/dL — ABNORMAL HIGH (ref 70–99)
Glucose-Capillary: 131 mg/dL — ABNORMAL HIGH (ref 70–99)
Glucose-Capillary: 144 mg/dL — ABNORMAL HIGH (ref 70–99)

## 2011-10-23 LAB — TISSUE CULTURE: Gram Stain: NONE SEEN

## 2011-10-23 LAB — TYPE AND SCREEN: Antibody Screen: NEGATIVE

## 2011-10-23 LAB — ANAEROBIC CULTURE: Gram Stain: NONE SEEN

## 2011-10-23 LAB — BODY FLUID CULTURE

## 2011-10-23 LAB — PROTIME-INR
INR: 2.11 — ABNORMAL HIGH (ref 0.00–1.49)
Prothrombin Time: 24 seconds — ABNORMAL HIGH (ref 11.6–15.2)

## 2011-10-23 SURGERY — VIDEO ASSISTED THORACOSCOPY (VATS)/THOROCOTOMY
Anesthesia: General | Site: Chest | Laterality: Right | Wound class: Clean

## 2011-10-23 MED ORDER — SODIUM CHLORIDE 0.9 % IV SOLN
0.2000 ug/kg/h | INTRAVENOUS | Status: DC
  Administered 2011-10-23: 0.4 ug/kg/h via INTRAVENOUS
  Administered 2011-10-23: 0.601 ug/kg/h via INTRAVENOUS
  Administered 2011-10-24: 0.4 ug/kg/h via INTRAVENOUS
  Filled 2011-10-23 (×4): qty 2

## 2011-10-23 MED ORDER — HEMOSTATIC AGENTS (NO CHARGE) OPTIME
TOPICAL | Status: DC | PRN
  Administered 2011-10-23: 1 via TOPICAL

## 2011-10-23 MED ORDER — LACTATED RINGERS IV SOLN
INTRAVENOUS | Status: DC | PRN
  Administered 2011-10-23 (×2): via INTRAVENOUS

## 2011-10-23 MED ORDER — ONDANSETRON HCL 4 MG/2ML IJ SOLN: 4.0000 mg | Freq: Once | INTRAMUSCULAR | Status: DC | PRN

## 2011-10-23 MED ORDER — FENTANYL CITRATE 0.05 MG/ML IJ SOLN
25.0000 ug | INTRAMUSCULAR | Status: DC | PRN
  Administered 2011-10-24 (×3): 50 ug via INTRAVENOUS
  Filled 2011-10-23 (×2): qty 2

## 2011-10-23 MED ORDER — PROPOFOL 10 MG/ML IV EMUL
INTRAVENOUS | Status: DC | PRN
  Administered 2011-10-23: 120 mg via INTRAVENOUS

## 2011-10-23 MED ORDER — OXYCODONE HCL 5 MG PO TABS: 5.0000 mg | ORAL_TABLET | ORAL | Status: DC | PRN

## 2011-10-23 MED ORDER — DEXTROSE 5 % IV SOLN
450.0000 mg | INTRAVENOUS | Status: DC | PRN
  Administered 2011-10-23: 30 mg/h via INTRAVENOUS

## 2011-10-23 MED ORDER — FENTANYL CITRATE 0.05 MG/ML IJ SOLN
50.0000 ug | INTRAMUSCULAR | Status: DC | PRN
  Administered 2011-10-23 (×3): 100 ug via INTRAVENOUS
  Filled 2011-10-23 (×2): qty 2

## 2011-10-23 MED ORDER — CHLORHEXIDINE GLUCONATE 0.12 % MT SOLN
15.0000 mL | Freq: Two times a day (BID) | OROMUCOSAL | Status: DC
  Administered 2011-10-23 – 2011-10-24 (×4): 15 mL via OROMUCOSAL
  Filled 2011-10-23 (×3): qty 15

## 2011-10-23 MED ORDER — LORAZEPAM 2 MG/ML IJ SOLN
4.0000 mg | Freq: Once | INTRAMUSCULAR | Status: AC
  Administered 2011-10-23: 4 mg via INTRAVENOUS

## 2011-10-23 MED ORDER — LORAZEPAM 2 MG/ML IJ SOLN
INTRAMUSCULAR | Status: AC
  Administered 2011-10-23: 4 mg
  Filled 2011-10-23: qty 2

## 2011-10-23 MED ORDER — MIDAZOLAM HCL 5 MG/5ML IJ SOLN
INTRAMUSCULAR | Status: DC | PRN
  Administered 2011-10-23: 4 mg via INTRAVENOUS

## 2011-10-23 MED ORDER — BISACODYL 5 MG PO TBEC
10.0000 mg | DELAYED_RELEASE_TABLET | Freq: Every day | ORAL | Status: DC
  Administered 2011-10-24 – 2011-11-02 (×8): 10 mg via ORAL
  Filled 2011-10-23 (×10): qty 2

## 2011-10-23 MED ORDER — MIDAZOLAM HCL 2 MG/2ML IJ SOLN
2.0000 mg | INTRAMUSCULAR | Status: DC | PRN
  Filled 2011-10-23: qty 4

## 2011-10-23 MED ORDER — ROCURONIUM BROMIDE 100 MG/10ML IV SOLN
INTRAVENOUS | Status: DC | PRN
  Administered 2011-10-23 (×2): 50 mg via INTRAVENOUS
  Administered 2011-10-23: 40 mg via INTRAVENOUS

## 2011-10-23 MED ORDER — DEXTROSE-NACL 5-0.9 % IV SOLN: INTRAVENOUS | Status: DC

## 2011-10-23 MED ORDER — ONDANSETRON HCL 4 MG/2ML IJ SOLN
4.0000 mg | Freq: Four times a day (QID) | INTRAMUSCULAR | Status: DC | PRN
  Filled 2011-10-23: qty 2

## 2011-10-23 MED ORDER — HYDROMORPHONE HCL PF 1 MG/ML IJ SOLN: 0.2500 mg | INTRAMUSCULAR | Status: DC | PRN

## 2011-10-23 MED ORDER — OXYCODONE-ACETAMINOPHEN 5-325 MG PO TABS: 1.0000 | ORAL_TABLET | ORAL | Status: DC | PRN

## 2011-10-23 MED ORDER — SODIUM CHLORIDE 0.9 % IV SOLN
50.0000 ug/h | INTRAVENOUS | Status: DC
  Filled 2011-10-23: qty 50

## 2011-10-23 MED ORDER — MORPHINE SULFATE 2 MG/ML IJ SOLN: 2.0000 mg | INTRAMUSCULAR | Status: DC | PRN

## 2011-10-23 MED ORDER — POTASSIUM CHLORIDE 10 MEQ/50ML IV SOLN
10.0000 meq | Freq: Every day | INTRAVENOUS | Status: DC | PRN
  Administered 2011-10-23: 10 meq via INTRAVENOUS

## 2011-10-23 MED ORDER — SODIUM CHLORIDE 0.9 % IV SOLN
INTRAVENOUS | Status: DC
  Administered 2011-10-23: 20 mL/h via INTRAVENOUS
  Administered 2011-10-28 – 2011-11-01 (×2): via INTRAVENOUS

## 2011-10-23 MED ORDER — 0.9 % SODIUM CHLORIDE (POUR BTL) OPTIME
TOPICAL | Status: DC | PRN
  Administered 2011-10-23: 1000 mL

## 2011-10-23 MED ORDER — MIDAZOLAM HCL 2 MG/2ML IJ SOLN: 2.0000 mg | INTRAMUSCULAR | Status: DC | PRN

## 2011-10-23 MED ORDER — FENTANYL CITRATE 0.05 MG/ML IJ SOLN
INTRAMUSCULAR | Status: DC | PRN
  Administered 2011-10-23: 50 ug via INTRAVENOUS
  Administered 2011-10-23 (×3): 100 ug via INTRAVENOUS
  Administered 2011-10-23 (×3): 50 ug via INTRAVENOUS

## 2011-10-23 MED ORDER — POTASSIUM CHLORIDE 10 MEQ/50ML IV SOLN
INTRAVENOUS | Status: AC
  Administered 2011-10-23: 10 meq via INTRAVENOUS
  Filled 2011-10-23: qty 50

## 2011-10-23 MED ORDER — FENTANYL BOLUS VIA INFUSION
50.0000 ug | Freq: Four times a day (QID) | INTRAVENOUS | Status: DC | PRN
  Filled 2011-10-23: qty 100

## 2011-10-23 MED ORDER — WHITE PETROLATUM GEL
Status: AC
  Administered 2011-10-23: 20:00:00
  Filled 2011-10-23: qty 5

## 2011-10-23 MED ORDER — SENNOSIDES-DOCUSATE SODIUM 8.6-50 MG PO TABS
1.0000 | ORAL_TABLET | Freq: Every evening | ORAL | Status: DC | PRN
  Filled 2011-10-23: qty 1

## 2011-10-23 SURGICAL SUPPLY — 65 items
APL SRG 22X2 LUM MLBL SLNT (VASCULAR PRODUCTS)
APL SRG 7X2 LUM MLBL SLNT (VASCULAR PRODUCTS)
APPLICATOR TIP COSEAL (VASCULAR PRODUCTS) IMPLANT
APPLICATOR TIP EXT COSEAL (VASCULAR PRODUCTS) IMPLANT
BLADE SURG 11 STRL SS (BLADE) ×1 IMPLANT
CANISTER SUCTION 2500CC (MISCELLANEOUS) ×2 IMPLANT
CATH KIT ON Q 5IN SLV (PAIN MANAGEMENT) IMPLANT
CATH THORACIC 28FR (CATHETERS) IMPLANT
CATH THORACIC 36FR (CATHETERS) ×1 IMPLANT
CATH THORACIC 36FR RT ANG (CATHETERS) ×1 IMPLANT
CLEANER TIP ELECTROSURG 2X2 (MISCELLANEOUS) ×2 IMPLANT
CLIP TI MEDIUM 6 (CLIP) IMPLANT
CLOTH BEACON ORANGE TIMEOUT ST (SAFETY) ×2 IMPLANT
CONN Y 3/8X3/8X3/8  BEN (MISCELLANEOUS) ×1
CONN Y 3/8X3/8X3/8 BEN (MISCELLANEOUS) IMPLANT
CONT SPEC 4OZ CLIKSEAL STRL BL (MISCELLANEOUS) ×4 IMPLANT
DRAPE LAPAROSCOPIC ABDOMINAL (DRAPES) ×2 IMPLANT
DRAPE SLUSH MACHINE 52X66 (DRAPES) IMPLANT
DRAPE SLUSH/WARMER DISC (DRAPES) IMPLANT
DRILL BIT 7/64X5 (BIT) ×2 IMPLANT
ELECT REM PT RETURN 9FT ADLT (ELECTROSURGICAL) ×2
ELECTRODE REM PT RTRN 9FT ADLT (ELECTROSURGICAL) ×1 IMPLANT
GAUZE SPONGE 4X4 12PLY STRL LF (GAUZE/BANDAGES/DRESSINGS) ×1 IMPLANT
GLOVE BIO SURGEON STRL SZ 6.5 (GLOVE) ×4 IMPLANT
GLOVE EUDERMIC 7 POWDERFREE (GLOVE) ×4 IMPLANT
GOWN STRL NON-REIN LRG LVL3 (GOWN DISPOSABLE) ×8 IMPLANT
KIT BASIN OR (CUSTOM PROCEDURE TRAY) ×2 IMPLANT
KIT ROOM TURNOVER OR (KITS) ×2 IMPLANT
KIT SUCTION CATH 14FR (SUCTIONS) ×3 IMPLANT
NS IRRIG 1000ML POUR BTL (IV SOLUTION) ×4 IMPLANT
PACK CHEST (CUSTOM PROCEDURE TRAY) ×2 IMPLANT
PAD ARMBOARD 7.5X6 YLW CONV (MISCELLANEOUS) ×4 IMPLANT
SEALANT PROGEL (MISCELLANEOUS) IMPLANT
SEALANT SURG COSEAL 4ML (VASCULAR PRODUCTS) IMPLANT
SEALANT SURG COSEAL 8ML (VASCULAR PRODUCTS) ×1 IMPLANT
SOLUTION ANTI FOG 6CC (MISCELLANEOUS) ×1 IMPLANT
SPONGE GAUZE 4X4 12PLY (GAUZE/BANDAGES/DRESSINGS) ×2 IMPLANT
STAPLER VISISTAT 35W (STAPLE) ×1 IMPLANT
SUT PROLENE 3 0 SH DA (SUTURE) IMPLANT
SUT PROLENE 4 0 RB 1 (SUTURE)
SUT PROLENE 4-0 RB1 .5 CRCL 36 (SUTURE) IMPLANT
SUT SILK  1 MH (SUTURE) ×2
SUT SILK 1 MH (SUTURE) ×2 IMPLANT
SUT SILK 2 0SH CR/8 30 (SUTURE) IMPLANT
SUT SILK 3 0SH CR/8 30 (SUTURE) IMPLANT
SUT VIC AB 1 CTX 18 (SUTURE) IMPLANT
SUT VIC AB 1 CTX 36 (SUTURE) ×2
SUT VIC AB 1 CTX36XBRD ANBCTR (SUTURE) IMPLANT
SUT VIC AB 2-0 CT1 27 (SUTURE) ×2
SUT VIC AB 2-0 CT1 TAPERPNT 27 (SUTURE) IMPLANT
SUT VIC AB 2-0 CTX 36 (SUTURE) IMPLANT
SUT VIC AB 3-0 X1 27 (SUTURE) ×1 IMPLANT
SUT VICRYL 2 TP 1 (SUTURE) ×1 IMPLANT
SWAB COLLECTION DEVICE MRSA (MISCELLANEOUS) ×1 IMPLANT
SYSTEM SAHARA CHEST DRAIN ATS (WOUND CARE) ×2 IMPLANT
TAPE CLOTH 4X10 WHT NS (GAUZE/BANDAGES/DRESSINGS) ×2 IMPLANT
TAPE CLOTH SURG 4X10 WHT LF (GAUZE/BANDAGES/DRESSINGS) ×1 IMPLANT
TIP APPLICATOR SPRAY EXTEND 16 (VASCULAR PRODUCTS) IMPLANT
TOWEL OR 17X24 6PK STRL BLUE (TOWEL DISPOSABLE) ×2 IMPLANT
TOWEL OR 17X26 10 PK STRL BLUE (TOWEL DISPOSABLE) ×4 IMPLANT
TRAP SPECIMEN MUCOUS 40CC (MISCELLANEOUS) IMPLANT
TRAY FOLEY CATH 14FRSI W/METER (CATHETERS) ×2 IMPLANT
TUBE ANAEROBIC SPECIMEN COL (MISCELLANEOUS) ×1 IMPLANT
TUNNELER SHEATH ON-Q 11GX8 (MISCELLANEOUS) IMPLANT
WATER STERILE IRR 1000ML POUR (IV SOLUTION) ×4 IMPLANT

## 2011-10-23 NOTE — Anesthesia Postprocedure Evaluation (Signed)
  Anesthesia Post-op Note  Patient: Dean [name]  Procedure(s) Performed:  VIDEO ASSISTED THORACOSCOPY (VATS)/THOROCOTOMY - with decortication  Patient Location: PACU  Anesthesia Type: General  Level of Consciousness: awake, alert  and oriented  Airway and Oxygen Therapy: Patient Spontanous Breathing  Post-op Pain: mild, moderate  Post-op Assessment: Post-op Vital signs reviewed and Patient's Cardiovascular Status Stable  Post-op Vital Signs: stable  Complications: No apparent anesthesia complications

## 2011-10-23 NOTE — OR Nursing (Signed)
OR waiting room visitor desk called at start of procedure to inform family.

## 2011-10-23 NOTE — Anesthesia Procedure Notes (Signed)
Procedure Name: Intubation Date/Time: 10/23/2011 8:04 AM Performed by: Lucious Groves Pre-anesthesia Checklist: Patient identified, Timeout performed, Emergency Drugs available, Suction available and Patient being monitored Patient Re-evaluated:Patient Re-evaluated prior to inductionOxygen Delivery Method: Circle System Utilized Preoxygenation: Pre-oxygenation with 100% oxygen Intubation Type: IV induction Ventilation: Mask ventilation without difficulty Laryngoscope Size: Mac and 3 Grade View: Grade I Endobronchial tube: Left, Double lumen EBT and EBT position confirmed by fiberoptic bronchoscope and 39 Fr Number of attempts: 1 Airway Equipment and Method: stylet Tube secured with: Tape Dental Injury: Teeth and Oropharynx as per pre-operative assessment

## 2011-10-23 NOTE — Anesthesia Preprocedure Evaluation (Addendum)
Anesthesia Evaluation  Patient identified by MRN, date of birth, ID band Patient awake    Reviewed: Allergy & Precautions  History of Anesthesia Complications Negative for: history of anesthetic complications  Airway Mallampati: II TM Distance: >3 FB     Dental  (+) Teeth Intact   Pulmonary shortness of breath and at rest, asthma ,  + rhonchi  + decreased breath sounds      Cardiovascular hypertension, Pt. on medications + dysrhythmias (on amiodarone, now in NSR) Atrial Fibrillation Regular Normal Mild aortic stenosis, EF 55-60%   Neuro/Psych Neuropathy    GI/Hepatic GERD-  ,  Endo/Other    Renal/GU      Musculoskeletal   Abdominal   Peds  Hematology   Anesthesia Other Findings   Reproductive/Obstetrics                         Anesthesia Physical Anesthesia Plan  ASA: III  Anesthesia Plan: General   Post-op Pain Management:    Induction: Intravenous  Airway Management Planned: Double Lumen EBT  Additional Equipment: Arterial line and CVP  Intra-op Plan:   Post-operative Plan: Possible Post-op intubation/ventilation  Informed Consent: I have reviewed the patients History and Physical, chart, labs and discussed the procedure including the risks, benefits and alternatives for the proposed anesthesia with the patient or authorized representative who has indicated his/her understanding and acceptance.   Dental advisory given  Plan Discussed with: CRNA and Surgeon  Anesthesia Plan Comments: (R Pneumonia with empyema Mils aortic stenosis   Plan GA with double lumen ETT  Roberts Gaudy, MD)        Anesthesia Quick Evaluation

## 2011-10-23 NOTE — Progress Notes (Signed)
PHARMACY - CRITICAL CARE PROGRESS NOTE  Pharmacy Consult: Primaxin & Zyvox dosing Indication: MRSA PNA   Vital Signs: Temp: 97.3 F (36.3 C) (01/17 1150) Temp src: Axillary (01/17 1150) BP: 118/64 mmHg (01/17 1309) Pulse Rate: 98  (01/17 1309) Labs:  Basename 10/23/11 0415 10/22/11 0425 10/21/11 0430 10/20/11 1708  WBC 10.3 13.7* 18.9* --  HGB 11.7* 11.9* 11.6* --  HCT 35.5* 34.8* 34.3* --  PLT 367 359 389 --  APTT -- -- -- --  INR 2.11* 2.20* 1.97* --  CREATININE 0.91 1.12 1.05 --  LABCREA -- -- -- --  CREATININE 0.91 1.12 1.05 --  LABCREA -- -- -- --  CREAT24HRUR -- -- -- --  MG -- -- -- --  PHOS -- -- -- --  ALBUMIN -- -- -- 1.8*  PROT -- -- -- 6.4  AST -- -- -- 26  ALT -- -- -- 23  ALKPHOS -- -- -- 82  BILITOT -- -- -- 0.9  BILIDIR -- -- -- --  IBILI -- -- -- --  HL = 0.38 Vanc trough - 16.7 goal 15-20  Admit Complaint: Patient admitted to [facility] with SOB, fever and progressively worsening cough. Transferred here for further management of PNA, and concerns for possible empyema.  Pharmacist System-Based Medication Review  Anticoag: H/o AFib. Coumadin PTA - held since admit 1/10). Heparin d/c'd-NO plans to restart. Old bloody mucus plug removed on bronch earlier this week.   ID: (Abx d14) Zyvox D#5 (s/p Primaxin 4d(d/c 1/16), 9dVanc[dc 1/13]/4dZosyn[dc 1/13] for MRSA PNA, started at OSH. WBC trending down, AF, MRSA PCR positive. CDiff PCR negative. Resp culture- few staph aureus/mod candida. Pleural fluid-pending. SCr stable Plans to continue for now for at least one more day as chest tube placed 1/14.  Cards: Afib- on dilt, warfarin, zocor, fish oil PTA. On Amio gtt (off dilt). BP OK, HR 84 (NSR), BNP 2661. EF 55%. Back to ICU because of RVR with hypoxia. No plans for future anticoagulation   Endo: No prior hx DM. CBGs OK sens SSI.   GI/Nutrition: Abd distention. NPO post-op.   Neuro: sedated post-op  Renal: SCr stable   Pulm: s/p VATS 1/17 (this am) now  intubated. Hopefully extubate tomorrow. Astelin, pulmicort, nasonex PTA.  Heme/Onc: Hydrxyurea PTA-continued, leukocytosis, H/H improved.  PTA Medication: Corrected all the meds on medrec  Best Practices: PPI IV, SCDs, peridex  Plan: 1. Continue Zyvox 600mg  IV q12, f/u LOT  Henry Utsey L. Amada Jupiter, PharmD, [place] Clinical Pharmacist Pager: [phone] 10/23/2011 1:53 PM

## 2011-10-23 NOTE — Transfer of Care (Signed)
Immediate Anesthesia Transfer of Care Note  Patient: Dean [name]  Procedure(s) Performed:  VIDEO ASSISTED THORACOSCOPY (VATS)/THOROCOTOMY - with decortication  Patient Location: ICU  Anesthesia Type: General  Level of Consciousness: Patient remains intubated per anesthesia plan  Airway & Oxygen Therapy: Patient remains intubated per anesthesia plan  Post-op Assessment: Post -op Vital signs reviewed and stable  Post vital signs: Reviewed and stable Filed Vitals:   10/23/11 0600  BP: 120/61  Pulse: 77  Temp:   Resp: 16    Complications: No apparent anesthesia complications

## 2011-10-23 NOTE — OR Nursing (Signed)
Bilateral hearing aids inclusive of 2 batteries removed. Placed in plastic cup and bag and labeled with patient's identification sticker. Sent to Unit [address] with patient's chart.

## 2011-10-23 NOTE — Progress Notes (Signed)
Pt complained of itching on upper back, pt stated he thought is was another episode of shingles. Dr Michail Sermon called and informed of pt status, no actions at this time.

## 2011-10-23 NOTE — Progress Notes (Signed)
Name: EILEEN GAGEN MRN: [id number] DOB: 03/26/1924    LOS: 7  [place] Pulmonary/Critical Care Progress Note  Subjective - 78 year old male who presented to [place] hospital on 1/2. Dx eval demonstrated MRSA PNA.Had progressive pleural effusion with thoracentesis on 1/8 showing exudative sample. Presented to [facility] on 1/10 for further evaluation of  worsening of Right sided airspace disease, increased work of breathing, AF w/ RVR and progressive leukocytosis.   Lines / Drains: Rt chest tube 1/14 >>> Rt IJ 1/16>>>  Cultures: c-diff pcr 1/10>>neg MRSA pcr>> +ve Sputum 1/7>>>MRSA  Influenza PCR1/2>>> Negative BAL 1/13>>>MRSA Pleural 1/14>>>  Antibiotics: primaxin 1/13>>>1/16 zyvox 1/13>>> Vanco 1/5>>>1/13 Zosyn 1/10>>>1/13 Levaquin 1/2>>>1/10 Rocephin 1/2>>>1/10   Tests / Events: 1/10 CT Chest: increase in right pleural effusion, mucous plugging 1/10 CT abd: no evidence of bowel obstruction or ileus 1/8: thoracentesis on 1/8 yielding a exudative sample consistent with a parapneumonia effusion.   ECHO 10/17/2010: EF 55%, preserved systolic function. Mild aortic stenosis.  1/10- no distress 1/13 - resp distress, hypoxia, complete right lung collapse. S/p Bronchoscopy (DF)- obstructed rt main with blood clot mixed with pus.  1/14- prior exudate knonw, increased effusion, chest tube placed, hydro pneurmo improved 1/14 - CT chest - hydro PNA, PNA 1/14- amio strted 1/15- improved Rt lung aeration post Chest tube placement 1/16- cardizem drip stopped, breathing unchanged, gross tolerated neg balance, no distress 1/17- s/p VATS, empyema drainage, rt lung decortication  Vital Signs: Temp:  [98.3 F (36.8 C)-99 F (37.2 C)] 98.7 F (37.1 C) (01/17 0424) Pulse Rate:  [77-100] 77  (01/17 0600) Resp:  [13-21] 16  (01/17 0600) BP: (104-130)/(60-105) 120/61 mmHg (01/17 0600) SpO2:  [90 %-97 %] 97 % (01/17 0600) FiO2 (%):  [50 %] 50 % (01/17 0600) CVP:  [0 mmHg-6 mmHg] 4  mmHg  Physical Examination: General:  Elderly male, intuabed, comfortable on vent Neuro: sedated HEENT:  Supple,no , Rt IJ clean Cardiovascular: S1 S2 , RRR Respic: reduced right, no wheezing Abdomen:  tender to palpation.. BS active Musculoskeletal: no edema  Labs and Imaging:   CBC:    Component Value Date/Time   WBC 10.3 10/23/2011 0415   HGB 11.7* 10/23/2011 0415   HCT 35.5* 10/23/2011 0415   PLT 367 10/23/2011 0415   MCV 95.9 10/23/2011 0415   BMET    Component Value Date/Time   NA 134* 10/23/2011 0415   K 3.0* 10/23/2011 0415   CL 93* 10/23/2011 0415   CO2 35* 10/23/2011 0415   GLUCOSE 115* 10/23/2011 0415   BUN 11 10/23/2011 0415   CREATININE 0.91 10/23/2011 0415   CALCIUM 8.0* 10/23/2011 0415   GFRNONAA 75* 10/23/2011 0415   GFRAA 87* 10/23/2011 0415   ABG    Component Value Date/Time   PHART 7.477* 10/23/2011 0532   PCO2ART 50.3* 10/23/2011 0532   PO2ART 86.6 10/23/2011 0532   HCO3 36.7* 10/23/2011 0532   TCO2 38.3 10/23/2011 0532   ACIDBASEDEF 1.0 10/19/2011 1611   O2SAT 97.6 10/23/2011 0532    PXR 1/17 improved rt lung aeration   Assessment and Plan:  HYDROPNEUMOthorax, exudative effusion, MRSA PNA, r/o empyema Hypoxic resp failure Plan: -s/o VATS, Rt lung decortication and empyema drainage -chest tube placed to suction, - abg reviewed. - metabolic alkalosis repeat ABG.  -pcxr in am  -NO heparin as bronch with bloody mucous plug and collapse  Abdominal distention/Ileus. In the setting of recent hospital stay and quinolone therapy Plan: -improved -NPO post op.  -Bowel regimen if needed  A-fib with RVR  Plan: -NSR currently -on amio drip.  -no heparin in his near future  Sepsis- primarily PNA/Parapneumonic effusion, possible colitis, r/o empyema -linazolid -BAL positive for MRSA  Urinary retention Plan: foley  GERD -PPI Start clears  Best practices / Disposition: -->ICU status under PCCM -->full code --> SCD -->vent bundle -->Protonix for  GI Px -->npo -->family updated at bedside  Patient seen and examined, agree with above note.  I dictated the care and orders written for this patient under my direct care.  Lester Newman, M.D.   STAFF NOTE: I, Dr Ann Lions have personally reviewed patient's available data, including medical history, events of note, physical examination and test results as part of my evaluation. I have discussed with resident/NP and other care providers such as pharmacist, RN and RRT.  In addition,  I personally evaluated patient and elicited key findings of MRSA empyema - now s/p VATS today and come back on ventialtor intubated to ICU with post anesth acute resp failure (was not on ventilator pre-op). Also amio for A fib. Initially sedated due to GA but later woke up and agitated needing prn fentanyl. However, needing 60% fio2 and therefore will not extubate. Oteherwise stable vitals. Will Rx with prn fentanyl and precdedex gtt and aim for extubation depending on course tomorrow 10/24/11. Wil monitor his mild metabolic alkalosis.  Rest per NP/medical resident whose note is outlined above and that I agree with  The patient is critically ill with multiple organ systems failure and requires high complexity decision making for assessment and support, frequent evaluation and titration of therapies, application of advanced monitoring technologies and extensive interpretation of multiple databases.   Critical Care Time devoted to patient care services described in this note is  35  minutes.  D/w Dr Cyndia Bent in ICU bedside  Dr. Brand Males, M.D., [facility].C.P Pulmonary and Critical Care Medicine Staff Physician [place] [facility] Pager: [phone], If no answer or between  15:00h - 7:00h: call [phone]  10/23/2011 3:38 PM

## 2011-10-23 NOTE — Progress Notes (Signed)
[facility] Progress Note Patient Name: MANASSEH NIZIOLEK DOB: Jun 10, 1924 MRN: [id number]  Date of Service  10/23/2011   HPI/Events of Note   No vent orders s/p VATS, pt came back on  vent  eICU Interventions  See vent orders   Intervention Category Major Interventions: Respiratory failure - evaluation and management  Asencion Noble 10/23/2011, 7:29 PM

## 2011-10-23 NOTE — Brief Op Note (Signed)
10/16/2011 - 10/23/2011  10:18 AM  PATIENT:  Dean [name]  78 y.o. male  PRE-OPERATIVE DIAGNOSIS:  right empyema  POST-OPERATIVE DIAGNOSIS:  right empyema  PROCEDURE:  Procedure(s): VIDEO ASSISTED THORACOSCOPY (VATS)/THOROCOTOMY, Drainage of Empyema, Decortication of right lung  SURGEON:  Surgeon(s): Gaye Pollack, MD  PHYSICIAN ASSISTANT: Josie Saunders, PAC  ASSISTANTS: none   ANESTHESIA:   general  EBL:  Total I/O In: 1000 [I.V.:1000] Out: 650 [Urine:400; Blood:250]  BLOOD ADMINISTERED:none  DRAINS: 2 chest tubes   PATIENT DISPOSITION:  ICU - intubated and hemodynamically stable.   Delay start of Pharmacological VTE agent (>24hrs) due to surgical blood loss or risk of bleeding:  {YES/NO/NOT APPLICABLE:20182

## 2011-10-24 ENCOUNTER — Inpatient Hospital Stay (HOSPITAL_COMMUNITY): Payer: Medicare Other

## 2011-10-24 LAB — POCT I-STAT 3, ART BLOOD GAS (G3+)
Acid-Base Excess: 13 mmol/L — ABNORMAL HIGH (ref 0.0–2.0)
O2 Saturation: 93 %
Patient temperature: 99.8
TCO2: 37 mmol/L (ref 0–100)

## 2011-10-24 LAB — CBC
MCH: 31.3 pg (ref 26.0–34.0)
MCV: 96.4 fL (ref 78.0–100.0)
Platelets: 367 10*3/uL (ref 150–400)
RDW: 14.9 % (ref 11.5–15.5)
WBC: 13 10*3/uL — ABNORMAL HIGH (ref 4.0–10.5)

## 2011-10-24 LAB — RENAL FUNCTION PANEL
BUN: 13 mg/dL (ref 6–23)
Calcium: 7.5 mg/dL — ABNORMAL LOW (ref 8.4–10.5)
Glucose, Bld: 122 mg/dL — ABNORMAL HIGH (ref 70–99)
Phosphorus: 2.9 mg/dL (ref 2.3–4.6)

## 2011-10-24 LAB — BASIC METABOLIC PANEL
Calcium: 7 mg/dL — ABNORMAL LOW (ref 8.4–10.5)
Creatinine, Ser: 0.94 mg/dL (ref 0.50–1.35)
GFR calc Af Amer: 86 mL/min — ABNORMAL LOW (ref >90)

## 2011-10-24 MED ORDER — POTASSIUM CHLORIDE IN NACL 40-0.9 MEQ/L-% IV SOLN
INTRAVENOUS | Status: DC
  Filled 2011-10-24 (×2): qty 1000

## 2011-10-24 MED ORDER — HEPARIN SODIUM (PORCINE) 5000 UNIT/ML IJ SOLN
5000.0000 [IU] | Freq: Two times a day (BID) | INTRAMUSCULAR | Status: DC
  Administered 2011-10-24 – 2011-10-26 (×7): 5000 [IU] via SUBCUTANEOUS
  Filled 2011-10-24 (×8): qty 1

## 2011-10-24 MED ORDER — POTASSIUM CHLORIDE CRYS ER 20 MEQ PO TBCR
40.0000 meq | EXTENDED_RELEASE_TABLET | Freq: Once | ORAL | Status: AC
  Administered 2011-10-24: 40 meq via ORAL
  Filled 2011-10-24: qty 2

## 2011-10-24 MED ORDER — FENTANYL CITRATE 0.05 MG/ML IJ SOLN
25.0000 ug | INTRAMUSCULAR | Status: DC | PRN
  Administered 2011-10-24: 50 ug via INTRAVENOUS
  Administered 2011-10-25: 100 ug via INTRAVENOUS
  Administered 2011-10-25: 50 ug via INTRAVENOUS
  Administered 2011-10-26 (×3): 100 ug via INTRAVENOUS
  Administered 2011-10-27: 50 ug via INTRAVENOUS
  Filled 2011-10-24 (×8): qty 2

## 2011-10-24 NOTE — Op Note (Signed)
NAMEKEAN, Dean [name]                ACCOUNT NO.:  [id number]  MEDICAL RECORD NO.:  [id number]  LOCATION:  2112                         FACILITY:  [facility]  PHYSICIAN:  Gilford Raid, M.D.     DATE OF BIRTH:  16-Sep-1924  DATE OF PROCEDURE:  10/23/2011 DATE OF DISCHARGE:                              OPERATIVE REPORT   PREOPERATIVE DIAGNOSIS:  Right lung pneumonia with empyema.  POSTOPERATIVE DIAGNOSIS:  Right lung pneumonia with empyema.  PROCEDURE:  Right video-assisted thoracoscopy, right thoracotomy with drainage of empyema and decortication of the right lung.  ATTENDING SURGEON:  Gilford Raid, MD  ASSISTANT:  Lars Pinks, PA-C  ANESTHESIA:  General endotracheal.  CLINICAL HISTORY:  This patient is an 78 year old gentleman who was admitted with presumed MRSA pneumonia involving the right lung.  He developed a large right pleural effusion, and had a chest tube inserted by Dr.  [name] a few days ago.  This drained a large amount of turbid fluid.  CT scan after the chest tube was placed showed persistent large right pleural fluid collection that was loculated with compressive atelectasis of the right lung.  The patient was still not markedly improved clinically after the chest tube was placed, and therefore I felt the best option would be to completely drain the right empyema, to allow complete re-expansion of the lung and resolution of his pneumonia. I discussed the operative procedure with the patient as well as his wife and daughter.  I discussed the possibility that we may need to perform a thoracotomy.  I discussed the benefits and risks, including, but not limited to, bleeding, blood transfusion, infection, injury to the lung, prolonged air leak, and respiratory failure.  They understood and agreed to proceed.  OPERATIVE PROCEDURE:  The patient was seen in the preoperative holding area and the proper patient, proper operative site were confirmed after reviewing the  x-rays and reviewing the chart.  Then, the right side of the chest was signed by me.  The patient was taken back to the operating room, placed on table supine position.  After induction of general endotracheal anesthesia using a double-lumen tube, the patient was positioned in the left lateral decubitus position with the right side up.  The patient already had a Foley catheter in place and pneumatic compression devices on his legs.  Then, the right side of the chest was prepped with Betadine soap and solution, draped in usual sterile manner. Time-out was taken.  Proper patient, proper operation, and proper operative side were confirmed with nursing and anesthesia staff after reviewing the CT scan in the operating room.  The previously placed the chest tube was removed.  Then, an 1 cm incision was made in the midaxillary line at approximately the 8th intercostal space.  Dissection was continued down to the pleural space, which was entered bluntly with a hemostat.  Then, a 10-mm trocar was inserted.  The 30 degree thoracoscope was inserted.  This revealed that the pleural space was essentially obliterated by the empyema.  I felt this would require thoracotomy for complete drainage.  Then, a right lateral thoracotomy incision was made and the pleural space was entered through about the 6th or  7th intercostal space.  A retractor was placed.  The pleural space was full of thick gelatinous material and fibrinous exudate.  This was filling the posterior chest and was completely evacuated.  There was a thick fibrinous peel over the right lower lobe and this was decorticated from the lung.  After complete drainage, the chest was irrigated with warm saline solution.  We did send cultures for aerobic and anaerobic upon opening the chest.  Two chest tubes were placed with the right angled 36-French chest tube positioned posteriorly in the costophrenic sulcus, and a 36-French straight chest tube  positioned posteriorly and up to the apex.  The ribs were then reapproximated with #2 Vicryl pericostal sutures.  Prior to tying the sutures, the lung was re-expanded and seen to fill the chest.  These sutures were then tied. The muscles reapproximated in layers using continuous 0 Vicryl suture. The subcutaneous tissue was closed with continuous 2-0 Vicryl and the skin with staples.  The old chest tube site was loosely reapproximated with staples.  The sponge, needle, and instrument counts were correct according to scrub nurse.  Dry sterile dressing was applied over the incision and around the chest tubes, which were Pleur-Evac suctioned. The patient was then turned in the supine position, and the double-lumen tube converted to a single-lumen tube and the patient was transported back to the medical intensive care unit in guarded but stable condition, intubated.     Gilford Raid, M.D.     BB/MEDQ  D:  10/23/2011  T:  10/24/2011  Job:  [id number]

## 2011-10-24 NOTE — Progress Notes (Signed)
1 Day Post-Op Procedure(s) (LRB): VIDEO ASSISTED THORACOSCOPY (VATS)/THOROCOTOMY (Right) Subjective: Intubated and weaning on vent  Objective: Vital signs in last 24 hours: Temp:  [97.3 F (36.3 C)-100.6 F (38.1 C)] 99.8 F (37.7 C) (01/18 0500) Pulse Rate:  [72-100] 75  (01/18 0900) Cardiac Rhythm:  [-] Normal sinus rhythm (01/17 2000) Resp:  [14-21] 15  (01/18 0900) BP: (82-144)/(51-93) 98/60 mmHg (01/18 0900) SpO2:  [89 %-97 %] 94 % (01/18 0900) Arterial Line BP: (71-190)/(48-91) 108/52 mmHg (01/18 0900) FiO2 (%):  [40 %-60 %] 50 % (01/18 0800) Weight:  [83.4 kg (183 lb 13.8 oz)] 83.4 kg (183 lb 13.8 oz) (01/18 0500)  Hemodynamic parameters for last 24 hours: CVP:  [0 mmHg-10 mmHg] 10 mmHg  Intake/Output from previous day: 01/17 0701 - 01/18 0700 In: 3552.7 [I.V.:2862.7; NG/GT:30; IV Piggyback:660] Out: [id number] [Urine:1125; Blood:250; Chest Tube:180] Intake/Output this shift: Total I/O In: 212.1 [P.O.:90; I.V.:122.1] Out: 60 [Urine:60]  General appearance: looks uncomfortable Heart: regular rate and rhythm, S1, S2 normal, no murmur, click, rub or gallop Lungs: diminished breath sounds both lower lobes  Lab Results:  Basename 10/24/11 0500 10/23/11 1200  WBC 13.0* 14.6*  HGB 11.3* 12.4*  HCT 34.8* 36.1*  PLT 367 366   BMET:  Basename 10/24/11 0500 10/23/11 1200  NA 137 135  [name] 3.4* 3.6  CL 100 96  CO2 30 33*  GLUCOSE 100* 148*  BUN 11 11  CREATININE 0.94 0.79  CALCIUM 7.0* 7.9*    PT/INR:  Basename 10/23/11 0415  LABPROT 24.0*  INR 2.11*   ABG    Component Value Date/Time   PHART 7.548* 10/24/2011 0534   HCO3 36.2* 10/24/2011 0534   TCO2 37 10/24/2011 0534   ACIDBASEDEF 1.0 10/19/2011 1611   O2SAT 93.0 10/24/2011 0534   CBG (last 3)   Basename 10/24/11 0759 10/24/11 0406 10/23/11 2354  GLUCAP 119* 133* 151*    Assessment/Plan: S/P Procedure(s) (LRB): VIDEO ASSISTED THORACOSCOPY (VATS)/THOROCOTOMY (Right) Drainage of empyema and  decortication. Keep CT's to suction today Extubate per CCm Work on pulmonary toilet. May start coumadin tomorrow for A fib.  May use DVT prophylaxis dose heparin.   LOS: 8 days    Dean [name],Dean [name] 10/24/2011

## 2011-10-24 NOTE — Procedures (Signed)
Extubation Procedure Note  Patient Details:   Name: Dean [name] DOB: 06-20-1924 MRN: [id number]   Airway Documentation:  Airway 8 mm (Active)  Secured at (cm) 22 cm 10/24/2011  3:42 AM  Measured From Lips 10/24/2011  3:42 AM  [place] 10/24/2011  3:42 AM  Secured By [employer] 10/24/2011  3:42 AM  Tube Holder Repositioned Yes 10/24/2011  3:42 AM  Cuff Pressure (cm H2O) 25 cm H2O 10/23/2011 11:00 AM  Site Condition Cool;Dry 10/23/2011 11:00 AM    Evaluation  O2 sats: stable throughout Complications: No apparent complications Patient did tolerate procedure well. Bilateral Breath Sounds: Coarse crackles Suctioning: Airway Yes No Stridor noted at this time.    Ross Marcus 10/24/2011, 11:47 AM

## 2011-10-24 NOTE — Progress Notes (Signed)
Name: Dean [name] MRN: [id number] DOB: 14-Feb-1924    LOS: 85  [facility] Pulmonary/Critical Care Progress Note  Subjective - 78 year old male who presented to [facility] on 1/2. Dx eval demonstrated MRSA PNA.Had progressive pleural effusion with thoracentesis on 1/8 showing exudative sample. Presented to [facility] on 1/10 for further evaluation of  worsening of Right sided airspace disease, increased work of breathing, AF w/ RVR and progressive leukocytosis.   Lines / Drains: Rt chest tube 1/14 >>>VATS,drainage 1/17>>> Rt IJ 1/16>>>  Cultures: c-diff pcr 1/10>>neg MRSA pcr>> +ve Sputum 1/7>>>MRSA  Influenza PCR1/2>>> Negative BAL 1/13>>>MRSA Pleural 1/14, 1/17 (surgical)>>>ngtd 1/18>>> procalcitonin- 0.18  Antibiotics: primaxin 1/13>>>1/16 zyvox 1/13>>> Vanco 1/5>>>1/13 Zosyn 1/10>>>1/13 Levaquin 1/2>>>1/10 Rocephin 1/2>>>1/10   Tests / Events: 1/10 CT Chest: increase in right pleural effusion, mucous plugging 1/10 CT abd: no evidence of bowel obstruction or ileus 1/8: thoracentesis on 1/8 yielding a exudative sample consistent with a parapneumonia effusion.   ECHO 10/17/2010: EF 55%, preserved systolic function. Mild aortic stenosis.  1/10- no distress 1/13 - resp distress, hypoxia, complete right lung collapse. S/p Bronchoscopy (DF)- obstructed rt main with blood clot mixed with pus.  1/14- prior exudate knonw, increased effusion, chest tube placed, hydro pneurmo improved 1/14 - CT chest - hydro PNA, PNA 1/14- amio strted 1/15- improved Rt lung aeration post Chest tube placement 1/16- cardizem drip stopped, breathing unchanged, gross tolerated neg balance, no distress 1/17- s/p VATS, empyema drainage, rt lung decortication.  vent post op  Subjective/Overnight event:  No changes overnight SBT/WUA- on precedex, no WUA yet Weaning- await WUA  Vital Signs: Temp:  [97.3 F (36.3 C)-100.6 F (38.1 C)] 99.8 F (37.7 C) (01/18 0500) Pulse Rate:  [72-100] 74  (01/18  0600) Resp:  [14-21] 14  (01/18 0600) BP: (82-144)/(51-93) 94/56 mmHg (01/18 0600) SpO2:  [89 %-97 %] 92 % (01/18 0600) Arterial Line BP: (71-190)/(48-91) 111/51 mmHg (01/18 0600) FiO2 (%):  [40 %-60 %] 50 % (01/18 0600) Weight:  [183 lb 13.8 oz (83.4 kg)] 183 lb 13.8 oz (83.4 kg) (01/18 0500) CVP:  [0 mmHg-8 mmHg] 0 mmHg  Physical Examination: General:  Elderly male, intuabed, comfortable on vent Neuro: sedated with precedex,, RASS -2, ICU CAM neg, responds to verbal stimuli, moves all four HEENT:  Supple,no , Rt IJ clean Cardiovascular: S1 S2 , RRR Respic: reduced right, no wheezing, rt chest tube draining  Abdomen:  tender to palpation.. BS diminished Musculoskeletal: no edema  Vent Mode:  [-] PRVC FiO2 (%):  [40 %-60 %] 40 % Set Rate:  [14 bmp] 14 bmp Vt Set:  [500 mL] 500 mL PEEP:  [5 cmH20] 5 cmH20 Plateau Pressure:  [10 [id number] cmH20] 20 cmH20  Labs and Imaging:   CBC:    Component Value Date/Time   WBC 13.0* 10/24/2011 0500   HGB 11.3* 10/24/2011 0500   HCT 34.8* 10/24/2011 0500   PLT 367 10/24/2011 0500   MCV 96.4 10/24/2011 0500   BMET    Component Value Date/Time   NA 137 10/24/2011 0500   K 3.4* 10/24/2011 0500   CL 100 10/24/2011 0500   CO2 30 10/24/2011 0500   GLUCOSE 100* 10/24/2011 0500   BUN 11 10/24/2011 0500   CREATININE 0.94 10/24/2011 0500   CALCIUM 7.0* 10/24/2011 0500   GFRNONAA 74* 10/24/2011 0500   GFRAA 86* 10/24/2011 0500   ABG    Component Value Date/Time   PHART 7.548* 10/24/2011 0534   PCO2ART 41.8 10/24/2011 0534   PO2ART  62.0* 10/24/2011 0534   HCO3 36.2* 10/24/2011 0534   TCO2 37 10/24/2011 0534   ACIDBASEDEF 1.0 10/19/2011 1611   O2SAT 93.0 10/24/2011 0534    PXR 1/18: NA   Assessment and Plan:  MRSA PNA,  Empyema,   Assessment- Hypoxic resp failure, on vent post op, was on 100% NRB prior to VATS Plan: -s/p VATS 1/17, Rt lung decortication and empyema drainage -chest tube placed to suction,continue - abg reviewed. - respi+  metabolic alkalosis, KCL per tube, reduce OG suction - vent settings noted, attempt SBT today after sedation turned off for 1 hour. Repeat ABG in afternoon.   -  pcxr in am  -NO heparin as bronch with bloody mucous plug and collapse  Abdominal distention/Ileus. In the setting of recent hospital stay and quinolone therapy  Assessment: loose stool, distended belly, diminished bowel sounds, bloody drainage in OG Plan -NPO post op.  - abd xray portable to r/o ilius/obstruction -Bowel regimen if needed - replete potassium iv  A-fib with RVR   Assessment : NSR, rate normal Plan: -on amio drip, titrate to off post extubation. Will restart home cardizem if needed -no heparin/coumadin in his near future  Sepsis- primarily PNA/Parapneumonic effusion, possible colitis, r/o empyema  Assessment: see problem 1 Plan -linazolid (minimum 14 days)  -BAL positive for MRSA   Urinary retention Plan: foley  GERD -PPI - npo   Best practices / Disposition: -->ICU status under PCCM -->full code --> SCD -->vent bundle -->Protonix for GI Px -->npo -->family not at bedside  Patient seen and examined, agree with above note.  I dictated the care and orders written for this patient under my direct care.  Lester Sperry, M.D.   STAFF NOTE: I, Dr Ann Lions have personally reviewed patient's available data, including medical history, events of note, physical examination and test results as part of my evaluation. I have discussed with resident/NP and other care providers such as pharmacist, RN and RRT.  In addition,  I personally evaluated patient and elicited key findings of MRSA empyema - now s/p VATS decortication 10/1711 and come back on ventialtor. Could not be extubated 1/17 due to pain, agitation. Overnight maintained on precedex and prn fentanyl. Had normal Wake up assessment 10/24/11 am and currently on PSV doing well (failed early in am in the immediate aftermath of a fentanyl bolus for  pain). However, if tolerates PSV now for 1-2h will aim to extubate (was not on vent pre thoracotomy) . Also amio for A fib since 1/16 (was getting hypotensive with cardizem gtt). IWil monitor his mild metabolic alkalosis.  Rest per NP/medical resident whose note is outlined above and that I agree with  The patient is critically ill with multiple organ systems failure and requires high complexity decision making for assessment and support, frequent evaluation and titration of therapies, application of advanced monitoring technologies and extensive interpretation of multiple databases.   Critical Care Time devoted to patient care services described in this note is  35  minutes.  D/w Dr Cyndia Bent in ICU bedside  Dr. Brand Males, M.D., [facility].C.P Pulmonary and Critical Care Medicine Staff Physician [place] [facility] Pager: [phone], If no answer or between  15:00h - 7:00h: call [phone]  10/24/2011 7:29 AM

## 2011-10-24 NOTE — Progress Notes (Signed)
ANTICOAGULATION CONSULT NOTE - Initial Consult  Pharmacy Consult:  Coumadin Indication: h/o AFib  No Known Allergies  Patient Measurements: Height: 6' (182.9 cm) Weight: 183 lb 13.8 oz (83.4 kg) IBW/kg (Calculated) : 77.6   Vital Signs: Temp: 99.8 F (37.7 C) (01/18 0500) Temp src: Oral (01/18 0500) BP: 98/60 mmHg (01/18 0900) Pulse Rate: 75  (01/18 0900)  Labs:  Basename 10/24/11 0500 10/23/11 1200 10/23/11 0415 10/22/11 0425  HGB 11.3* 12.4* -- --  HCT 34.8* 36.1* 35.5* --  PLT 367 366 367 --  APTT -- -- -- --  LABPROT -- -- 24.0* 24.8*  INR -- -- 2.11* 2.20*  HEPARINUNFRC -- -- -- --  CREATININE 0.94 0.79 0.91 --  CKTOTAL -- -- -- --  CKMB -- -- -- --  TROPONINI -- -- -- --   Estimated Creatinine Clearance: 63.1 ml/min (by C-G formula based on Cr of 0.94).  Medical History: Past Medical History  Diagnosis Date  . PAF (paroxysmal atrial fibrillation)     followed by [place] cardiology  . Aortic valve disease     followed by [facility] cardiology  . Prostate cancer     s/p seed implants  . Environmental allergies   . Asthma   . Essential thrombocytopenia     followed by Dr Bobby Rumpf (hematology)  . Neuropathy   . Insomnia   . Gout   . Obesity   . Hypertension   . Hyperlipidemia   . Osteoarthritis   . Hypoxia, sleep related     Assessment: 87 YOM with hx of Afib on Coumadin PTA.  Coumadin has been on hold since 10/16/11 and INR was 2.11 yesterday.  Now s/p VATS and Coumadin to resume 10/25/11 per MD.  No INR today, but expect it to trend down, so ok to start heparin SQ BID.  Goal of Therapy:  INR 2-3   Plan:  - Start Coumadin 10/25/11 per MD order - Daily INR to start tomorrow - D/C heparin SQ once INR therapeutic  Johnnette Gourd Dien 10/24/2011,11:04 AM

## 2011-10-24 NOTE — Progress Notes (Signed)
INITIAL ADULT NUTRITION ASSESSMENT Date: 10/24/2011   Time: 3:04 PM  Reason for Assessment: Prolonged NPO/Clear Liquids  ASSESSMENT: Male 78 y.o.  Dx: MRSA pneumonia  Hx:  Past Medical History  Diagnosis Date  . PAF (paroxysmal atrial fibrillation)     followed by [place] cardiology  . Aortic valve disease     followed by [facility] cardiology  . Prostate cancer     s/p seed implants  . Environmental allergies   . Asthma   . Essential thrombocytopenia     followed by Dr Bobby Rumpf (hematology)  . Neuropathy   . Insomnia   . Gout   . Obesity   . Hypertension   . Hyperlipidemia   . Osteoarthritis   . Hypoxia, sleep related     Related Meds:     . bisacodyl  10 mg Oral Daily  . chlorhexidine  15 mL Mouth/Throat BID  . heparin subcutaneous  5,000 Units Subcutaneous Q12H  . insulin aspart  0-9 Units Subcutaneous Q4H  . levalbuterol  0.63 mg Nebulization Q6H  . linezolid  600 mg Intravenous Q12H  . pantoprazole (PROTONIX) IV  40 mg Intravenous Q24H  . potassium chloride  40 mEq Oral Once  . white petrolatum        Ht: 6' (182.9 cm)  Wt: 183 lb 13.8 oz (83.4 kg)  Ideal Wt: 80.9 kg  % Ideal Wt: 103%  Usual Wt: 190 lb/ 86.3 kg per pt. % Usual Wt: 96.6%  Body mass index is 24.94 kg/(m^2). WNL  Food/Nutrition Related Hx: Pt has had no hx of unintentional weight loss PTA per pt. Pt stated he has not had any difficulties chewing/swallowing PTA.    Labs:  CMP     Component Value Date/Time   NA 134* 10/24/2011 1138   K 3.5 10/24/2011 1138   CL 97 10/24/2011 1138   CO2 31 10/24/2011 1138   GLUCOSE 122* 10/24/2011 1138   BUN 13 10/24/2011 1138   CREATININE 0.96 10/24/2011 1138   CALCIUM 7.5* 10/24/2011 1138   PROT 6.4 10/20/2011 1708   ALBUMIN 1.5* 10/24/2011 1138   AST 26 10/20/2011 1708   ALT 23 10/20/2011 1708   ALKPHOS 82 10/20/2011 1708   BILITOT 0.9 10/20/2011 1708   GFRNONAA 73* 10/24/2011 1138   GFRAA 85* 10/24/2011 1138   CBG (last 3)   Basename 10/24/11 1141  10/24/11 0759 10/24/11 0406  GLUCAP 122* 119* 133*    Intake/Output Summary (Last 24 hours) at 10/24/11 1507 Last data filed at 10/24/11 1100  Gross per 24 hour  Intake 1964.66 ml  Output    560 ml  Net 1404.66 ml    Diet Order: NPO  IVF:    sodium chloride Last Rate: 40 mL/hr at 10/23/11 1500  amiodarone (NEXTERONE PREMIX) 360 mg/200 mL dextrose Last Rate: 0.5 mg/min (10/24/11 1245)  DISCONTD: 0.9 % NaCl with KCl 40 mEq / L   DISCONTD: dexmedetomidine (PRECEDEX) IV infusion Last Rate: 0.4 mcg/kg/hr (10/24/11 0800)  DISCONTD: fentaNYL infusion INTRAVENOUS     Estimated Nutritional Needs:   Kcal: 1800-2000 Protein: 100-115 gm Fluid: 1.8-2 L  CT determined no ileus present at this time per RN. Pt on clear liquids prior to VATS procedure, RN will give pt clear liquids tonight and see how he does. Pt stated he has no problems swallowing and is hungry; pt hopes to have diet advanced soon.  Pt is at increased nutrition risk due to inadequate diet since admission. Pt has been either NPO or  on clear liquids since admission.  NUTRITION DIAGNOSIS: -Inadequate oral intake (NI-2.1).  Status: Ongoing  RELATED TO: inability to eat  AS EVIDENCE BY: NPO status  MONITORING/EVALUATION(Goals): Goal: Intake >75% of meals (when diet advanced) to better meet estimated needs Monitor: diet advancement,labs, weight trends, I/O's  EDUCATION NEEDS: -Education not appropriate at this time  INTERVENTION:  No supplements recommended at this time. Initiate supplements if PO intake <50% of meals once diet is advanced.  Dietitian #:[phone]  [place] Per approved criteria  -Severe malnutrition in the context of acute illness or injury    Cleotilde Neer 10/24/2011, 3:04 PM  Dalene Carrow [phone]

## 2011-10-24 NOTE — Progress Notes (Signed)
UR Completed.  Vergie Living [id number] 10/24/2011

## 2011-10-25 ENCOUNTER — Inpatient Hospital Stay (HOSPITAL_COMMUNITY): Payer: Medicare Other

## 2011-10-25 DIAGNOSIS — J13 Pneumonia due to Streptococcus pneumoniae: Secondary | ICD-10-CM

## 2011-10-25 LAB — GLUCOSE, CAPILLARY
Glucose-Capillary: 109 mg/dL — ABNORMAL HIGH (ref 70–99)
Glucose-Capillary: 114 mg/dL — ABNORMAL HIGH (ref 70–99)
Glucose-Capillary: 120 mg/dL — ABNORMAL HIGH (ref 70–99)
Glucose-Capillary: 122 mg/dL — ABNORMAL HIGH (ref 70–99)

## 2011-10-25 LAB — CBC
Hemoglobin: 10.9 g/dL — ABNORMAL LOW (ref 13.0–17.0)
MCH: 31.6 pg (ref 26.0–34.0)
MCV: 94.2 fL (ref 78.0–100.0)
Platelets: 317 10*3/uL (ref 150–400)
RBC: 3.45 MIL/uL — ABNORMAL LOW (ref 4.22–5.81)
WBC: 10.8 10*3/uL — ABNORMAL HIGH (ref 4.0–10.5)

## 2011-10-25 LAB — PRO B NATRIURETIC PEPTIDE: Pro B Natriuretic peptide (BNP): 1317 pg/mL — ABNORMAL HIGH (ref 0–450)

## 2011-10-25 LAB — COMPREHENSIVE METABOLIC PANEL
ALT: 18 U/L (ref 0–53)
AST: 23 U/L (ref 0–37)
CO2: 31 mEq/L (ref 19–32)
Chloride: 95 mEq/L — ABNORMAL LOW (ref 96–112)
GFR calc Af Amer: 77 mL/min — ABNORMAL LOW (ref >90)
GFR calc non Af Amer: 66 mL/min — ABNORMAL LOW (ref >90)
Glucose, Bld: 113 mg/dL — ABNORMAL HIGH (ref 70–99)
Sodium: 133 mEq/L — ABNORMAL LOW (ref 135–145)
Total Bilirubin: 0.5 mg/dL (ref 0.3–1.2)

## 2011-10-25 MED ORDER — POTASSIUM CHLORIDE CRYS ER 20 MEQ PO TBCR
40.0000 meq | EXTENDED_RELEASE_TABLET | Freq: Once | ORAL | Status: AC
  Administered 2011-10-25: 40 meq via ORAL
  Filled 2011-10-25: qty 2

## 2011-10-25 MED ORDER — POTASSIUM CHLORIDE 20 MEQ/15ML (10%) PO LIQD
ORAL | Status: AC
  Filled 2011-10-25: qty 30

## 2011-10-25 MED ORDER — WARFARIN SODIUM 4 MG PO TABS
4.0000 mg | ORAL_TABLET | Freq: Once | ORAL | Status: AC
  Administered 2011-10-25: 4 mg via ORAL
  Filled 2011-10-25: qty 1

## 2011-10-25 MED ORDER — BIOTENE DRY MOUTH MT LIQD
15.0000 mL | Freq: Two times a day (BID) | OROMUCOSAL | Status: DC
  Administered 2011-10-25 – 2011-11-02 (×16): 15 mL via OROMUCOSAL

## 2011-10-25 MED ORDER — DILTIAZEM HCL ER COATED BEADS 180 MG PO CP24
180.0000 mg | ORAL_CAPSULE | Freq: Every day | ORAL | Status: DC
  Administered 2011-10-25 – 2011-11-03 (×10): 180 mg via ORAL
  Filled 2011-10-25 (×10): qty 1

## 2011-10-25 MED ORDER — CHLORHEXIDINE GLUCONATE 0.12 % MT SOLN
15.0000 mL | Freq: Two times a day (BID) | OROMUCOSAL | Status: DC
  Administered 2011-10-25 – 2011-11-02 (×16): 15 mL via OROMUCOSAL
  Filled 2011-10-25 (×21): qty 15

## 2011-10-25 MED ORDER — PANTOPRAZOLE SODIUM 40 MG PO TBEC
40.0000 mg | DELAYED_RELEASE_TABLET | Freq: Every day | ORAL | Status: DC
  Administered 2011-10-25 – 2011-11-02 (×9): 40 mg via ORAL
  Filled 2011-10-25 (×10): qty 1

## 2011-10-25 MED ORDER — SIMETHICONE 80 MG PO CHEW
80.0000 mg | CHEWABLE_TABLET | Freq: Four times a day (QID) | ORAL | Status: DC | PRN
  Filled 2011-10-25: qty 1

## 2011-10-25 NOTE — Progress Notes (Signed)
[facility] Progress Note Patient Name: Dean [name] DOB: 1924/09/22 MRN: [id number]  Date of Service  10/25/2011   HPI/Events of Note   Gas pain  eICU Interventions    simethicone   Intervention Category Minor Interventions: Routine modifications to care plan (e.g. PRN medications for pain, fever)  Coleman Kalas J 10/25/2011, 5:34 PM

## 2011-10-25 NOTE — Progress Notes (Signed)
Name: Dean [name] MRN: [id number] DOB: [birth year]    LOS: 11  [facility] Progress Note  Subjective - 78 year old male who presented to [facility] on 1/2. Dx eval demonstrated MRSA PNA.Had progressive pleural effusion with thoracentesis on 1/8 showing exudative sample. Presented to [facility] on 1/10 for further evaluation of  worsening of Right sided airspace disease, increased work of breathing, AF w/ RVR and progressive leukocytosis.   Lines / Drains: Rt chest tube 1/14 >>>VATS,drainage 1/17>>> Rt IJ 1/16>>> ETT 1/17>>1/18 (post VATS)  Cultures: c-diff pcr 1/10>>neg MRSA pcr>> +ve Sputum 1/7>>>MRSA  Influenza PCR1/2>>> Negative BAL 1/13>>>MRSA Pleural 1/14, 1/17 (surgical)>>>ngtd 1/18>>> procalcitonin- 0.18  Antibiotics: primaxin 1/13>>>1/16 zyvox 1/13>>> (14 days) Vanco 1/5>>>1/13 Zosyn 1/10>>>1/13 Levaquin 1/2>>>1/10 Rocephin 1/2>>>1/10   Tests / Events: 1/10 CT Chest: increase in right pleural effusion, mucous plugging 1/10 CT abd: no evidence of bowel obstruction or ileus 1/8: thoracentesis on 1/8 yielding a exudative sample consistent with a parapneumonia effusion.   ECHO 10/17/2010: EF 55%, preserved systolic function. Mild aortic stenosis.  1/10- no distress 1/13 - resp distress, hypoxia, complete right lung collapse. S/p Bronchoscopy (DF)- obstructed rt main with blood clot mixed with pus.  1/14- prior exudate knonw, increased effusion, chest tube placed, hydro pneurmo improved 1/14 - CT chest - hydro PNA, PNA 1/14- amio strted 1/15- improved Rt lung aeration post Chest tube placement 1/16- cardizem drip stopped, breathing unchanged, gross tolerated neg balance, no distress 1/17- s/p VATS, empyema drainage, rt lung decortication.  vent post op  Subjective/Overnight event:  Extubated yesterday 1/18 Doing well per RN Took clears Only has mild pain Siting in bed in chair position  Vital Signs: Temp:  [98.9 F (37.2 C)-100.3 F (37.9  C)] 98.9 F (37.2 C) (01/19 0010) Pulse Rate:  [84-105] 94  (01/19 0900) Resp:  [11-24] 19  (01/19 0900) BP: (102-136)/(55-76) 136/73 mmHg (01/19 0900) SpO2:  [91 %-96 %] 93 % (01/19 0900) Arterial Line BP: (84-144)/(58-77) 143/74 mmHg (01/19 0000) Weight:  [86.1 kg (189 lb 13.1 oz)] 86.1 kg (189 lb 13.1 oz) (01/19 0600) CVP:  [4 mmHg] 4 mmHg  Physical Examination: General:  Elderly male, Looks deconditioned and chronically unwell Neuro: RASS 0 to +1. CAM-ICU negative for delirium. Alert and Oriented x 3. Very deconditioneds HEENT:  Supple,no , Rt IJ clean Cardiovascular: S1 S2 , RRR Respic: reduced right, no wheezing, rt chest tube drains x 2 Abdomen:  Non tender to palpation.. BS diminished Musculoskeletal: no edema   Labs and Imaging:   CBC:    Component Value Date/Time   WBC 10.8* 10/25/2011 0445   HGB 10.9* 10/25/2011 0445   HCT 32.5* 10/25/2011 0445   PLT 317 10/25/2011 0445   MCV 94.2 10/25/2011 0445   BMET    Component Value Date/Time   NA 133* 10/25/2011 0445   K 3.1* 10/25/2011 0445   CL 95* 10/25/2011 0445   CO2 31 10/25/2011 0445   GLUCOSE 113* 10/25/2011 0445   BUN 12 10/25/2011 0445   CREATININE 1.00 10/25/2011 0445   CALCIUM 7.8* 10/25/2011 0445   GFRNONAA 66* 10/25/2011 0445   GFRAA 77* 10/25/2011 0445   ABG    Component Value Date/Time   PHART 7.548* 10/24/2011 0534   PCO2ART 41.8 10/24/2011 0534   PO2ART 62.0* 10/24/2011 0534   HCO3 36.2* 10/24/2011 0534   TCO2 37 10/24/2011 0534   ACIDBASEDEF 1.0 10/19/2011 1611   O2SAT 93.0 10/24/2011 0534    CMP     Component  Value Date/Time   NA 133* 10/25/2011 0445   K 3.1* 10/25/2011 0445   CL 95* 10/25/2011 0445   CO2 31 10/25/2011 0445   GLUCOSE 113* 10/25/2011 0445   BUN 12 10/25/2011 0445   CREATININE 1.00 10/25/2011 0445   CALCIUM 7.8* 10/25/2011 0445   PROT 5.9* 10/25/2011 0445   ALBUMIN 1.5* 10/25/2011 0445   AST 23 10/25/2011 0445   ALT 18 10/25/2011 0445   ALKPHOS 60 10/25/2011 0445   BILITOT 0.5 10/25/2011 0445     GFRNONAA 66* 10/25/2011 0445   GFRAA 77* 10/25/2011 0445    Dg Chest Port 1 View  10/25/2011  *RADIOLOGY REPORT*  Clinical Data: Right thoracotomy.  PORTABLE CHEST - 1 VIEW  Comparison: 10/24/2011  Findings: Endotracheal tube has been removed.  Right chest tube and central line remain in place, unchanged.  No pneumothorax.  Low lung volumes with bilateral lower lobe opacities, cardiomegaly, and small effusions are noted, unchanged.  IMPRESSION: Interval extubation.  Otherwise no change.  Original Report Authenticated By: Raelyn Number, M.D.   Dg Chest Port 1 View  10/24/2011  *RADIOLOGY REPORT*  Clinical Data: Acute respiratory failure, recent thoracotomy  PORTABLE CHEST - 1 VIEW  Comparison: Portable chest x-ray of 10/22/2036  Findings: The tip of the endotracheal tube is approximately 4.0 cm above the carina.  Aeration has improved minimally.  Bibasilar opacities remain consistent with atelectasis and effusions. Pneumonia cannot be excluded.  Cardiomegaly is stable.  Right IJ central venous line and right chest tube remain.  No pneumothorax is noted.  IMPRESSION:  1.  Persistent basilar opacities consistent with atelectasis and effusions.  Cannot exclude pneumonia. 2.  Right chest tube remains.  No pneumothorax. 3.  Endotracheal tube tip 4.0 cm above the carina.  Original Report Authenticated By: Joretta Bachelor, M.D.   Dg Chest Port 1 View  10/23/2011  *RADIOLOGY REPORT*  Clinical Data: Postop  PORTABLE CHEST - 1 VIEW  Comparison: 500 hour  Findings: Endotracheal tube placed with its tip 3.4 cm from the carina.  Right chest tube and right central venous catheter are stable.  There is a new chest tube projecting over the right hemithorax at the lung base.  Heterogeneous opacities throughout both lungs stable. No pneumothorax.  IMPRESSION: Endotracheal tube placed.  Additional right chest tube has been placed.  Stable airspace disease.  Original Report Authenticated By: Jamas Lav, M.D.   Dg Abd  Portable 1v  10/24/2011  *RADIOLOGY REPORT*  Clinical Data: Abdominal distention  PORTABLE ABDOMEN - 1 VIEW  Comparison: Abdomen film of 10/16/2011 and CT abdomen pelvis of 10/16/2011  Findings: A portable supine view of the abdomen shows no evidence of bowel obstruction.  Multiple radiation seeds are noted overlying the region of the prostate.  There are degenerative changes in the lower lumbar spine with a lumbar rotatory scoliosis present.  IMPRESSION: No bowel obstruction.  Original Report Authenticated By: Joretta Bachelor, M.D.       Assessment and Plan:  MRSA PNA,  Empyema,  Hypoxic resp failure, on vent post op, was on 100% NRB prior to VATS. s/p VATS 1/17, Rt lung decortication and empyema drainage   - On 10/25/11: On few liters nasal cannula. 2 Chest tubes + PLAN -continue pulmonary toilet - titrate fio2 for pulse ox > 92%  Abdominal distention/Ileus. In the setting of recent hospital stay and quinolone therapy Assessment: loose stool, distended belly, diminished bowel sounds, bloody drainage in OG on 1/18. AXR - no ileus  1/18 Tolerating clears 1/19 Plan - keep K > 4  A-fib with RVR  Assessment : NSR, rate normal Plan: -on amio drip since 1/14 (startd by PCCM due to tachycardia despite cardizem and soft BP), titrate to off post extubation. Current HR 90 PLAN  Will restart home cardizem 180mg  CD24h  -SQ heparin dvt proph since 1/18  - po coumadin intitaited 1/18 - no  IV heparin in setting of decortication  Sepsis- primarily PNA/Parapneumonic effusion, possible colitis, r/o empyema. BAL MRSA Assessment: see problem 1 Plan -linazolid (minimum 14 days)  Per flow sheet  PAIN   - from chest tube site PLAN  fent prn  Urinary retention Plan: foley  GERD -PPI - npo   HYPOKALEMIA  Lab 10/25/11 0445 10/24/11 1138 10/24/11 0500 10/23/11 1200 10/23/11 0415  K 3.1* 3.5 3.4* 3.6 3.0*  PLAN Repleted by elink. Replace again 73meq Check mag and phos 1/20   Best practices  / Disposition: -->move to SDU under PCCM; dc IV amio and start po cardizem -->full code --> SCD -->vent bundle -->Protonix for GI Px -->clear and advance food as tolerated -->family not at bedside - >PT/OT/Inpatient rehab consult requested 10/25/11   [facility], M.D.    Dr. Brand Males, M.D., [facility].C.P Pulmonary and Critical Care Medicine Staff Physician [facility] Pager: [phone], If no answer or between  15:00h - 7:00h: call [phone]  10/25/2011 10:17 AM

## 2011-10-25 NOTE — Progress Notes (Addendum)
[address]            [place] [address]          [phone]     2 Days Post-Op  Procedure(s) (LRB): VIDEO ASSISTED THORACOSCOPY (VATS)/THOROCOTOMY (Right) Subjective: Extubated, weak, feels bloated, passing flatus  Objective    Temp:  [98.9 F (37.2 C)-100.3 F (37.9 C)] 98.9 F (37.2 C) (01/19 0010) Pulse Rate:  [84-105] 94  (01/19 0900) Resp:  [16-24] 19  (01/19 0900) BP: (107-136)/(55-76) 136/73 mmHg (01/19 0900) SpO2:  [91 %-96 %] 93 % (01/19 0900) Arterial Line BP: (84-144)/(58-77) 143/74 mmHg (01/19 0000) Weight:  [189 lb 13.1 oz (86.1 kg)] 189 lb 13.1 oz (86.1 kg) (01/19 0600)   Intake/Output Summary (Last 24 hours) at 10/25/11 1126 Last data filed at 10/25/11 0800  Gross per 24 hour  Intake   1384 ml  Output    865 ml  Net    519 ml       General appearance: alert, fatigued and no distress Heart: regular rate and rhythm Lungs: diminished in the bases Abdomen: +distension, no TTP, + BS Extremities: pas in place Wound: dressings clean  Lab Results:  [facility] 10/25/11 0445 10/24/11 1138  NA 133* 134*  K 3.1* 3.5  CL 95* 97  CO2 31 31  GLUCOSE 113* 122*  BUN 12 13  CREATININE 1.00 0.96  CALCIUM 7.8* 7.5*  MG -- --  PHOS -- 2.9    Basename 10/25/11 0445 10/24/11 1138  AST 23 --  ALT 18 --  ALKPHOS 60 --  BILITOT 0.5 --  PROT 5.9* --  ALBUMIN 1.5* 1.5*   No results found for this basename: LIPASE:2,AMYLASE:2 in the last 72 hours  Basename 10/25/11 0445 10/24/11 0500  WBC 10.8* 13.0*  NEUTROABS -- --  HGB 10.9* 11.3*  HCT 32.5* 34.8*  MCV 94.2 96.4  PLT 317 367   No results found for this basename: CKTOTAL:4,CKMB:4,TROPONINI:4 in the last 72 hours No components found with this basename: POCBNP:3 No results found for this basename: DDIMER in the last 72 hours No results found for this basename: HGBA1C in the last 72 hours No results found for this basename: CHOL,HDL,LDLCALC,TRIG,CHOLHDL in the last  72 hours No results found for this basename: TSH,T4TOTAL,FREET3,T3FREE,THYROIDAB in the last 72 hours No results found for this basename: VITAMINB12,FOLATE,FERRITIN,TIBC,IRON,RETICCTPCT in the last 72 hours  Medications: Scheduled    . antiseptic oral rinse  15 mL Mouth Rinse q12n4p  . bisacodyl  10 mg Oral Daily  . chlorhexidine  15 mL Mouth Rinse BID  . diltiazem  180 mg Oral Daily  . heparin subcutaneous  5,000 Units Subcutaneous Q12H  . insulin aspart  0-9 Units Subcutaneous Q4H  . levalbuterol  0.63 mg Nebulization Q6H  . linezolid  600 mg Intravenous Q12H  . pantoprazole  40 mg Oral QHS  . potassium chloride  40 mEq Oral Once  . potassium chloride  40 mEq Oral Once  . warfarin  4 mg Oral ONCE-1800  . DISCONTD: chlorhexidine  15 mL Mouth/Throat BID  . DISCONTD: pantoprazole (PROTONIX) IV  40 mg Intravenous Q24H     Radiology/Studies:  Dg Chest Port 1 View  10/25/2011  *RADIOLOGY REPORT*  Clinical Data: Right thoracotomy.  PORTABLE CHEST - 1 VIEW  Comparison: 10/24/2011  Findings: Endotracheal tube has been removed.  Right chest tube and central line remain in  place, unchanged.  No pneumothorax.  Low lung volumes with bilateral lower lobe opacities, cardiomegaly, and small effusions are noted, unchanged.  IMPRESSION: Interval extubation.  Otherwise no change.  Original Report Authenticated By: Raelyn Number, M.D.   Dg Chest Port 1 View  10/24/2011  *RADIOLOGY REPORT*  Clinical Data: Acute respiratory failure, recent thoracotomy  PORTABLE CHEST - 1 VIEW  Comparison: Portable chest x-ray of 10/22/2036  Findings: The tip of the endotracheal tube is approximately 4.0 cm above the carina.  Aeration has improved minimally.  Bibasilar opacities remain consistent with atelectasis and effusions. Pneumonia cannot be excluded.  Cardiomegaly is stable.  Right IJ central venous line and right chest tube remain.  No pneumothorax is noted.  IMPRESSION:  1.  Persistent basilar opacities consistent  with atelectasis and effusions.  Cannot exclude pneumonia. 2.  Right chest tube remains.  No pneumothorax. 3.  Endotracheal tube tip 4.0 cm above the carina.  Original Report Authenticated By: Joretta Bachelor, M.D.   Dg Chest Port 1 View  10/23/2011  *RADIOLOGY REPORT*  Clinical Data: Postop  PORTABLE CHEST - 1 VIEW  Comparison: 500 hour  Findings: Endotracheal tube placed with its tip 3.4 cm from the carina.  Right chest tube and right central venous catheter are stable.  There is a new chest tube projecting over the right hemithorax at the lung base.  Heterogeneous opacities throughout both lungs stable. No pneumothorax.  IMPRESSION: Endotracheal tube placed.  Additional right chest tube has been placed.  Stable airspace disease.  Original Report Authenticated By: Jamas Lav, M.D.   Dg Abd Portable 1v  10/24/2011  *RADIOLOGY REPORT*  Clinical Data: Abdominal distention  PORTABLE ABDOMEN - 1 VIEW  Comparison: Abdomen film of 10/16/2011 and CT abdomen pelvis of 10/16/2011  Findings: A portable supine view of the abdomen shows no evidence of bowel obstruction.  Multiple radiation seeds are noted overlying the region of the prostate.  There are degenerative changes in the lower lumbar spine with a lumbar rotatory scoliosis present.  IMPRESSION: No bowel obstruction.  Original Report Authenticated By: Joretta Bachelor, M.D.    INR: Will add last result for INR, ABG once components are confirmed Will add last 4 CBG results once components are confirmed  Assessment/Plan: S/P Procedure(s) (LRB): VIDEO ASSISTED THORACOSCOPY (VATS)/THOROCOTOMY (Right) 1. CT 120 cc yesterday , keep in place for now, cont abx /med rx per primary   LOS: 9 days    Dean [name],Dean [name] 1/19/201311:26 AM    Now extubated Has significant ileus with distended abdomen, go slow on po intake, check abdominal film with am cxr I have seen and examined Dean [name] and agree with the above assessment  and plan.  Dean Isaac  MD Beeper [phone] Office [phone] 10/25/2011 11:47 AM

## 2011-10-25 NOTE — Progress Notes (Signed)
ANTICOAGULATION CONSULT NOTE - Initial Consult  Pharmacy Consult:  Coumadin Indication: h/o AFib  No Known Allergies  Patient Measurements: Height: 6' (182.9 cm) Weight: 189 lb 13.1 oz (86.1 kg) IBW/kg (Calculated) : 77.6   Vital Signs: Temp: 98.9 F (37.2 C) (01/19 0010) Temp src: Oral (01/19 0010) BP: 136/73 mmHg (01/19 0900) Pulse Rate: 94  (01/19 0900)  Labs:  Basename 10/25/11 0445 10/24/11 1138 10/24/11 0500 10/23/11 1200 10/23/11 0415  HGB 10.9* -- 11.3* -- --  HCT 32.5* -- 34.8* 36.1* --  PLT 317 -- 367 366 --  APTT -- -- -- -- --  LABPROT 20.8* -- -- -- 24.0*  INR 1.76* -- -- -- 2.11*  HEPARINUNFRC -- -- -- -- --  CREATININE 1.00 0.96 0.94 -- --  CKTOTAL -- -- -- -- --  CKMB -- -- -- -- --  TROPONINI -- -- -- -- --   Estimated Creatinine Clearance: 59.3 ml/min (by C-G formula based on Cr of 1).  Medical History: Past Medical History  Diagnosis Date  . PAF (paroxysmal atrial fibrillation)     followed by [place] cardiology  . Aortic valve disease     followed by [facility] cardiology  . Prostate cancer     s/p seed implants  . Environmental allergies   . Asthma   . Essential thrombocytopenia     followed by Dr Bobby Rumpf (hematology)  . Neuropathy   . Insomnia   . Gout   . Obesity   . Hypertension   . Hyperlipidemia   . Osteoarthritis   . Hypoxia, sleep related     Assessment: 87 YOM with hx of Afib on Coumadin PTA.  Coumadin has been on hold since 10/16/11. INR 1.76 today.  Now s/p VATS and Coumadin to resume 10/25/11 per MD. On heparin SQ BID.   PTA regimen was 5mg  on Tues, Fri. And 4mg  on Mon, Wed, Thurs, Sat, Sun.   Goal of Therapy:  INR 2-3   Plan:  - Start Coumadin 4mg  po x1 tonight.  - Daily INR.  - D/C heparin SQ once INR therapeutic  Brain Hilts 10/25/2011,11:13 AM

## 2011-10-26 ENCOUNTER — Inpatient Hospital Stay (HOSPITAL_COMMUNITY): Payer: Medicare Other

## 2011-10-26 DIAGNOSIS — J13 Pneumonia due to Streptococcus pneumoniae: Secondary | ICD-10-CM

## 2011-10-26 DIAGNOSIS — J9600 Acute respiratory failure, unspecified whether with hypoxia or hypercapnia: Secondary | ICD-10-CM

## 2011-10-26 DIAGNOSIS — J9819 Other pulmonary collapse: Secondary | ICD-10-CM

## 2011-10-26 DIAGNOSIS — J15212 Pneumonia due to Methicillin resistant Staphylococcus aureus: Secondary | ICD-10-CM

## 2011-10-26 LAB — GLUCOSE, CAPILLARY
Glucose-Capillary: 123 mg/dL — ABNORMAL HIGH (ref 70–99)
Glucose-Capillary: 102 mg/dL — ABNORMAL HIGH (ref 70–99)
Glucose-Capillary: 105 mg/dL — ABNORMAL HIGH (ref 70–99)
Glucose-Capillary: 103 mg/dL — ABNORMAL HIGH (ref 70–99)

## 2011-10-26 LAB — BASIC METABOLIC PANEL
GFR calc Af Amer: >90 mL/min (ref >90)
GFR calc non Af Amer: 78 mL/min — ABNORMAL LOW (ref >90)
Glucose, Bld: 131 mg/dL — ABNORMAL HIGH (ref 70–99)
Potassium: 3.2 mEq/L — ABNORMAL LOW (ref 3.5–5.1)
Sodium: 132 mEq/L — ABNORMAL LOW (ref 135–145)

## 2011-10-26 MED ORDER — POTASSIUM CHLORIDE 20 MEQ/15ML (10%) PO LIQD
40.0000 meq | ORAL | Status: AC
  Administered 2011-10-26 (×2): 40 meq via ORAL
  Filled 2011-10-26 (×2): qty 30

## 2011-10-26 MED ORDER — WARFARIN SODIUM 4 MG PO TABS
4.0000 mg | ORAL_TABLET | Freq: Once | ORAL | Status: AC
Start: 2011-10-26 — End: 2011-10-26
  Administered 2011-10-26: 4 mg via ORAL
  Filled 2011-10-26: qty 1

## 2011-10-26 NOTE — Progress Notes (Signed)
Physical Therapy Evaluation Patient Details Name: GORJE ALBU MRN: [id number] DOB: 1923/11/01 Today's Date: 10/26/2011  Problem List:  Patient Active Problem List  Diagnoses  . MRSA pneumonia  . Exudative pleural effusion  . Dyspnea  . Ileus  . Constipation  . Atrial fibrillation with RVR  . Abdominal pain  . Sepsis  . Obesity  . GERD (gastroesophageal reflux disease)  . Hyponatremia  . Urinary retention  . Atelectasis  . PAF (paroxysmal atrial fibrillation)  . Aortic valve disease  . Prostate cancer  . Environmental allergies  . Asthma  . Essential thrombocytopenia  . Neuropathy  . Insomnia  . Gout  . Hypertension  . Hyperlipidemia  . Osteoarthritis  . Hypoxia, sleep related    Past Medical History:  Past Medical History  Diagnosis Date  . PAF (paroxysmal atrial fibrillation)     followed by [place] cardiology  . Aortic valve disease     followed by [facility] cardiology  . Prostate cancer     s/p seed implants  . Environmental allergies   . Asthma   . Essential thrombocytopenia     followed by Dr Bobby Rumpf (hematology)  . Neuropathy   . Insomnia   . Gout   . Obesity   . Hypertension   . Hyperlipidemia   . Osteoarthritis   . Hypoxia, sleep related    Past Surgical History: No past surgical history on file.  PT Assessment/Plan/Recommendation PT Assessment Clinical Impression Statement: pt s/p VATS with MRSA PNA.  pt very motivated for mobility, but limited by fatigue, pain, chest tube to suction.  pt would benefit from CIR stay to [place] prior to D/C to home.   PT Recommendation/Assessment: Patient will need skilled PT in the acute care venue PT Problem List: Decreased strength;Decreased activity tolerance;Decreased balance;Decreased mobility;Decreased knowledge of use of DME;Decreased knowledge of precautions;Cardiopulmonary status limiting activity;Pain Barriers to Discharge: None PT Therapy Diagnosis : Difficulty walking;Acute pain PT  Plan PT Frequency: Min 3X/week PT Treatment/Interventions: DME instruction;Gait training;Stair training;Functional mobility training;Therapeutic activities;Balance training;Therapeutic exercise;Patient/family education PT Recommendation Recommendations for Other Services: Rehab consult;OT consult Follow Up Recommendations: Inpatient Rehab Equipment Recommended: Defer to next venue PT Goals  Acute Rehab PT Goals PT Goal Formulation: With patient Time For Goal Achievement: 2 weeks Pt will go Supine/Side to Sit: Independently PT Goal: Supine/Side to Sit - Progress: Goal set today Pt will go Sit to Supine/Side: Independently PT Goal: Sit to Supine/Side - Progress: Goal set today Pt will go Sit to Stand: with modified independence;with upper extremity assist PT Goal: Sit to Stand - Progress: Goal set today Pt will Ambulate: >150 feet;with supervision;with rolling walker PT Goal: Ambulate - Progress: Goal set today Pt will Go Up / Down Stairs: 3-5 stairs;with supervision;with least restrictive assistive device PT Goal: Up/Down Stairs - Progress: Goal set today  PT Evaluation Precautions/Restrictions  Precautions Precautions: Fall (Orange contact, Chest tube) Required Braces or Orthoses: No Restrictions Weight Bearing Restrictions: No Prior Functioning  Home Living Lives With: Spouse;Daughter Receives Help From: Family Type of Home: House Home Layout: One level (with basement) Home Access: Stairs to enter Entrance Stairs-Rails: None Entrance Stairs-Number of Steps: 1 Home Adaptive Equipment: None Prior Function Level of Independence: Independent with basic ADLs;Independent with homemaking with ambulation;Independent with gait;Independent with transfers Able to Take Stairs?: Reciprically Driving: Yes Cognition Cognition Orientation Level: Oriented X4 Sensation/Coordination   Extremity Assessment RLE Assessment RLE Assessment:  (Grossly 4/5) LLE Assessment LLE Assessment:   (Grossly 4/5) Mobility (including Balance) Bed Mobility  Bed Mobility: Yes Rolling Right: 3: Mod assist;With rail Rolling Right Details (indicate cue type and reason): cues for use of rail and movement of LEs Rolling Left: 3: Mod assist;With rail Right Sidelying to Sit: 3: Mod assist;HOB elevated (comment degrees) (HOB ~35 degrees) Right Sidelying to Sit Details (indicate cue type and reason): cues for use of UEs and sequencing Sitting - Scoot to Edge of Bed: 2: Max assist Transfers Transfers: Yes Sit to Stand: 1: +2 Total assist;With upper extremity assist;Patient percentage (comment);From bed (pt 60%) Sit to Stand Details (indicate cue type and reason): cues for use of UEs, anterior wt shift Stand to Sit: 1: +2 Total assist;Patient percentage (comment);With upper extremity assist;To chair/3-in-1 (pt 70%) Stand to Sit Details: cues for use of UEs, control descent Ambulation/Gait Ambulation/Gait: Yes Ambulation/Gait Assistance: 1: +2 Total assist;Patient percentage (comment) (pt 80%) Ambulation/Gait Assistance Details (indicate cue type and reason): pt fatigues very quickly, but motivated for ambulation.  +2 needed for safety and multiple lines and equip.   Ambulation Distance (Feet): 5 Feet Assistive device: Rolling walker Gait Pattern: Step-through pattern;Decreased stride length;Trunk flexed Stairs: No Wheelchair Mobility Wheelchair Mobility: No  Posture/Postural Control Posture/Postural Control: No significant limitations Balance Balance Assessed: No Exercise    End of Session PT - End of Session Equipment Utilized During Treatment: Gait belt Activity Tolerance: Patient tolerated treatment well;Patient limited by fatigue;Patient limited by pain Patient left: in chair;with call bell in reach Nurse Communication: Mobility status for transfers General Behavior During Session: [facility] for tasks performed Cognition: [facility] for tasks performed  Catarina Hartshorn,  [place] 10/26/2011, 1:59 PM

## 2011-10-26 NOTE — Progress Notes (Signed)
Potassium 40 mEq administered for K-3.2

## 2011-10-26 NOTE — Progress Notes (Addendum)
[address]            [place] [address]          [phone]     3 Days Post-Op  Procedure(s) (LRB): VIDEO ASSISTED THORACOSCOPY (VATS)/THOROCOTOMY (Right) Subjective: Feels a little better, pain controlled, less SOB  Objective   Temp:  [97.5 F (36.4 C)-100.8 F (38.2 C)] 98.2 F (36.8 C) (01/20 0809) Pulse Rate:  [80-98] 80  (01/20 0809) Resp:  [15-22] 18  (01/20 0809) BP: (101-142)/(4-79) 121/64 mmHg (01/20 0809) SpO2:  [88 %-97 %] 92 % (01/20 0809)   Intake/Output Summary (Last 24 hours) at 10/26/11 1006 Last data filed at 10/26/11 0810  Gross per 24 hour  Intake   1143 ml  Output   1786 ml  Net   -643 ml   CT -none recorded yesterday    General appearance: alert, fatigued and no distress Heart: RRR, 2/6 syst murmur Lungs: coarse BS's Abdomen: slightly less distended, non tender Extremities: extremities normal, atraumatic, no cyanosis or edema Wound: incisions healing well  Lab Results:  Basename 10/26/11 0105 10/25/11 0445 10/24/11 1138  NA 132* 133* --  K 3.2* 3.1* --  CL 97 95* --  CO2 30 31 --  GLUCOSE 131* 113* --  BUN 11 12 --  CREATININE 0.83 1.00 --  CALCIUM 7.9* 7.8* --  MG 2.0 -- --  PHOS 2.9 -- 2.9    Basename 10/25/11 0445 10/24/11 1138  AST 23 --  ALT 18 --  ALKPHOS 60 --  BILITOT 0.5 --  PROT 5.9* --  ALBUMIN 1.5* 1.5*   No results found for this basename: LIPASE:2,AMYLASE:2 in the last 72 hours  Basename 10/25/11 0445 10/24/11 0500  WBC 10.8* 13.0*  NEUTROABS -- --  HGB 10.9* 11.3*  HCT 32.5* 34.8*  MCV 94.2 96.4  PLT 317 367   No results found for this basename: CKTOTAL:4,CKMB:4,TROPONINI:4 in the last 72 hours No components found with this basename: POCBNP:3 No results found for this basename: DDIMER in the last 72 hours No results found for this basename: HGBA1C in the last 72 hours No results found for this basename: CHOL,HDL,LDLCALC,TRIG,CHOLHDL in the last 72 hours No results  found for this basename: TSH,T4TOTAL,FREET3,T3FREE,THYROIDAB in the last 72 hours No results found for this basename: VITAMINB12,FOLATE,FERRITIN,TIBC,IRON,RETICCTPCT in the last 72 hours  Medications: Scheduled    . antiseptic oral rinse  15 mL Mouth Rinse q12n4p  . bisacodyl  10 mg Oral Daily  . chlorhexidine  15 mL Mouth Rinse BID  . diltiazem  180 mg Oral Daily  . heparin subcutaneous  5,000 Units Subcutaneous Q12H  . insulin aspart  0-9 Units Subcutaneous Q4H  . levalbuterol  0.63 mg Nebulization Q6H  . linezolid  600 mg Intravenous Q12H  . pantoprazole  40 mg Oral QHS  . potassium chloride  40 mEq Oral Q4H  . potassium chloride  40 mEq Oral Once  . warfarin  4 mg Oral ONCE-1800  . DISCONTD: pantoprazole (PROTONIX) IV  40 mg Intravenous Q24H     Radiology/Studies:  Dg Chest Port 1 View  10/25/2011  *RADIOLOGY REPORT*  Clinical Data: Right thoracotomy.  PORTABLE CHEST - 1 VIEW  Comparison: 10/24/2011  Findings: Endotracheal tube has been removed.  Right chest tube and central line remain in place, unchanged.  No pneumothorax.  Low lung volumes with bilateral lower lobe opacities, cardiomegaly, and  small effusions are noted, unchanged.  IMPRESSION: Interval extubation.  Otherwise no change.  Original Report Authenticated By: Raelyn Number, M.D.   Dg Abd Acute W/chest  10/26/2011  *RADIOLOGY REPORT*  Clinical Data: Pain around chest tube, abdominal distension  ACUTE ABDOMEN SERIES (ABDOMEN 2 VIEW & CHEST 1 VIEW)  Comparison: CT chest abdomen pelvis dated 10/16/2011  Findings: Right apical and basilar chest tubes.  No pneumothorax is seen.  Cardiomegaly with possible mild interstitial edema, equivocal. Right lower lobe opacity, likely a combination of atelectasis and pleural effusion.  Right IJ venous catheter.  Skin staples overlying the right lower chest wall.  Nonobstructive bowel gas pattern.  No evidence of free air on the lateral decubitus view.  Cholecystectomy clips.   Brachytherapy seeds in the prostate.  Degenerative changes of the visualized thoracolumbar spine.  IMPRESSION: Right apical and basilar chest tubes.  No pneumothorax is seen.  Cardiomegaly with possible mild interstitial edema, equivocal. Right pleural effusion with associated lower lobe atelectasis.  No evidence of small bowel obstruction or free air.  Original Report Authenticated By: Julian Hy, M.D.    INR: Will add last result for INR, ABG once components are confirmed Will add last 4 CBG results once components are confirmed  Assessment/Plan: S/P Procedure(s) (LRB): VIDEO ASSISTED THORACOSCOPY (VATS)/THOROCOTOMY (Right)  1. conts slow progression, poss d/c chest tubes soon    GOLD,WAYNE E 1/20/201310:06 AM     Chart reviewed, patient examined, agree with above. Minimal CT output.  CXR stable.  He can have both chest tubes removed tomorrow.

## 2011-10-26 NOTE — Progress Notes (Signed)
Dr. Christinia Gully made aware of pleural tissue culture results >MRSA positive.

## 2011-10-26 NOTE — Progress Notes (Signed)
[facility] Progress Note Patient Name: YOUSSIF HAMER DOB: [birth year] MRN: [id number]  Date of Service  10/26/2011   HPI/Events of Note   K 3.2, renal fxn normal  eICU Interventions  Replete with 40 meq oral x 2 q4 hours.     Intervention Category Minor Interventions: Electrolytes abnormality - evaluation and management  Riana Tessmer 10/26/2011, 2:06 AM

## 2011-10-26 NOTE — Progress Notes (Signed)
Final result on tissue right pleural culture done on 10/23/11  Positive for MRSA. Will notify MD during rounds.

## 2011-10-26 NOTE — Progress Notes (Signed)
ANTICOAGULATION CONSULT NOTE - Initial Consult  Pharmacy Consult:  Coumadin Indication: h/o AFib  No Known Allergies  Patient Measurements: Height: 6' (182.9 cm) Weight: 189 lb 13.1 oz (86.1 kg) IBW/kg (Calculated) : 77.6   Vital Signs: Temp: 99.1 F (37.3 C) (01/20 1205) Temp src: Oral (01/20 1205) BP: 109/60 mmHg (01/20 1205) Pulse Rate: 81  (01/20 1205)  Labs:  Basename 10/26/11 0105 10/25/11 0445 10/24/11 1138 10/24/11 0500  HGB -- 10.9* -- 11.3*  HCT -- 32.5* -- 34.8*  PLT -- 317 -- 367  APTT -- -- -- --  LABPROT 20.8* 20.8* -- --  INR 1.76* 1.76* -- --  HEPARINUNFRC -- -- -- --  CREATININE 0.83 1.00 0.96 --  CKTOTAL -- -- -- --  CKMB -- -- -- --  TROPONINI -- -- -- --   Estimated Creatinine Clearance: 71.4 ml/min (by C-G formula based on Cr of 0.83).  Medical History: Past Medical History  Diagnosis Date  . PAF (paroxysmal atrial fibrillation)     followed by [place] cardiology  . Aortic valve disease     followed by [facility] cardiology  . Prostate cancer     s/p seed implants  . Environmental allergies   . Asthma   . Essential thrombocytopenia     followed by Dr Bobby Rumpf (hematology)  . Neuropathy   . Insomnia   . Gout   . Obesity   . Hypertension   . Hyperlipidemia   . Osteoarthritis   . Hypoxia, sleep related     Assessment: 87 YOM with hx of Afib on Coumadin PTA.  Coumadin restarted 10/25/11 after being held since 10/16/11.  No change in INR overnight. Also on heparin SQ BID.   PTA regimen was 5mg  on Tues, Fri. And 4mg  on Mon, Wed, Thurs, Sat, Sun.   Goal of Therapy:  INR 2-3   Plan:  - Repeat Coumadin 4mg  po x1 tonight.  - Daily INR.  - D/C heparin SQ once INR therapeutic  Faelyn Sigler, Lavonia Drafts 10/26/2011,2:20 PM

## 2011-10-26 NOTE — Progress Notes (Signed)
Name: GRACE AMBROGIO MRN: [id number] DOB: 14-Feb-1924    LOS: 66  [place] PCCM Progress Note  Brief patient profile:  87 yowm to [facility] hospital on 1/2. Dx eval demonstrated MRSA PNA.Had progressive pleural effusion with thoracentesis on 1/8 showing exudative  > to [facility] on 1/10 for further evaluation of  worsening of Right sided airspace disease, increased work of breathing, AF w/ RVR and progressive leukocytosis.   Lines / Drains: Rt chest tube 1/14 >>>VATS,drainage 1/17>>> Rt IJ 1/16>>> ETT 1/17> 1/18   Micro/Sepsis c-diff pcr 1/10>>neg  MRSA pcr>> +ve Sputum 1/7>>>MRSA  Influenza PCR1/2>>> Negative BAL 1/13>>>MRSA R Pleural 1/17 >  MRSA   Antibiotics: Vanco 1/5>>>1/13 Zosyn 1/10>>>1/13 Levaquin 1/2>>>1/10 Rocephin 1/2>>>1/10  primaxin 1/13>>>1/16 zyvox (MRSA PNA/ empyema)1/13>>>    Tests / Events: 1/10 CT Chest: increase in right pleural effusion, mucous plugging 1/10 CT abd: no evidence of bowel obstruction or ileus 1/8: thoracentesis on 1/8 yielding a exudative sample consistent with a parapneumonia effusion.   ECHO 10/17/2010: EF 55%, preserved systolic function. Mild aortic stenosis.  1/10- no distress 1/13 - resp distress, hypoxia, complete right lung collapse. S/p Bronchoscopy (DF)- obstructed rt main with blood clot mixed with pus.  1/14- prior exudate knonw, increased effusion, chest tube placed, hydro pneurmo improved 1/14 - CT chest - hydro PNA, PNA 1/14- amio strted 1/15- improved Rt lung aeration post Chest tube placement 1/16- cardizem drip stopped, breathing unchanged, gross tolerated neg balance, no distress 1/17- s/p VATS, empyema drainage, rt lung decortication.  vent post op  Subjective/Overnight event:   Sitting in bed in chair position sat 93 @ 2lpm   Vital Signs: Temp:  [98.2 F (36.8 C)-100.8 F (38.2 C)] 99.1 F (37.3 C) (01/20 1205) Pulse Rate:  [80-98] 81  (01/20 1205) Resp:  [15-22] 19  (01/20 1205) BP: (101-142)/(57-79) 109/60 mmHg  (01/20 1205) SpO2:  [88 %-97 %] 93 % (01/20 1205)    Physical Examination: General:  Elderly male, Looks deconditioned and chronically unwell Neuro: alert/ oriented  though responses hesitant/slowed HEENT:  Supple,no , Rt IJ clean Cardiovascular: S1 S2 , RRR Respic: reduced right, no wheezing, rt chest tube drains x 2 Abdomen:  Non tender to palpation.. BS diminished Musculoskeletal: no edema   Labs   Lab 10/26/11 0105 10/25/11 0445 10/24/11 1138  NA 132* 133* 134*  K 3.2* 3.1* 3.5  CL 97 95* 97  CO2 30 31 31   BUN 11 12 13   CREATININE 0.83 1.00 0.96  GLUCOSE 131* 113* 122*    Lab 10/25/11 0445 10/24/11 0500 10/23/11 1200  HGB 10.9* 11.3* 12.4*  HCT 32.5* 34.8* 36.1*  WBC 10.8* 13.0* 14.6*  PLT 317 367 366        Dg Abd Acute W/chest  10/26/2011   IMPRESSION: Right apical and basilar chest tubes.  No pneumothorax is seen.  Cardiomegaly with possible mild interstitial edema, equivocal. Right pleural effusion with associated lower lobe atelectasis.  No evidence of small bowel obstruction or free air.       Assessment and Plan:  MRSA PNA,  Empyema,  Hypoxic resp failure, on vent post op, was on 100% NRB prior to VATS. s/p VATS 1/17, Rt lung decortication and empyema drainage Doing ok on 2600 PLAN -continue pulmonary toilet - titrate fio2 for pulse ox > 92%  Abdominal distention/Ileus. In the setting of recent hospital stay and quinolone therapy Assessment: loose stool, distended belly, diminished bowel sounds, bloody drainage in OG on 1/18. AXR - no ileus 1/18 Tolerating  clears  Plan - keep K > 4  A-fib with RVR  Assessment : NSR, rate normal Plan: -on amio drip since 1/14 (startd by PCCM due to tachycardia despite cardizem and soft BP)    Off 1/19     PLAN  home cardizem 180mg  CD24h  -SQ heparin dvt proph since 1/18  - po coumadin intiated 1/18 - no  IV heparin in setting of decortication  Sepsis- primarily PNA/Parapneumonic effusion, possible colitis,  r/o empyema. BAL MRSA Assessment: see problem 1 Plan -linazolid (minimum 14 days)  Per flow sheet  PAIN   - from chest tube site PLAN  fent prn  Urinary retention Plan: foley  GERD -PPI - npo   HYPOKALEMIA  Lab 10/26/11 0105 10/25/11 0445 10/24/11 1138 10/24/11 0500 10/23/11 1200  K 3.2* 3.1* 3.5 3.4* 3.6  PLAN Repleted by elink. Replace again 97meq Check mag and phos 1/20   Best practices / Disposition: -->  SDU under PCCM; d -->full code --> SCD  -->Protonix for GI Px -->clear and advance food as tolerated -->family not at bedside   Christinia Gully, MD Pulmonary and [place] [facility] [phone]

## 2011-10-27 ENCOUNTER — Inpatient Hospital Stay (HOSPITAL_COMMUNITY): Payer: Medicare Other

## 2011-10-27 DIAGNOSIS — R5381 Other malaise: Secondary | ICD-10-CM

## 2011-10-27 DIAGNOSIS — J159 Unspecified bacterial pneumonia: Secondary | ICD-10-CM

## 2011-10-27 DIAGNOSIS — J90 Pleural effusion, not elsewhere classified: Secondary | ICD-10-CM

## 2011-10-27 LAB — PROTIME-INR: Prothrombin Time: 27.1 seconds — ABNORMAL HIGH (ref 11.6–15.2)

## 2011-10-27 LAB — GLUCOSE, CAPILLARY
Glucose-Capillary: 141 mg/dL — ABNORMAL HIGH (ref 70–99)
Glucose-Capillary: 143 mg/dL — ABNORMAL HIGH (ref 70–99)
Glucose-Capillary: 144 mg/dL — ABNORMAL HIGH (ref 70–99)
Glucose-Capillary: 95 mg/dL (ref 70–99)

## 2011-10-27 MED ORDER — OXYCODONE-ACETAMINOPHEN 5-325 MG PO TABS
2.0000 | ORAL_TABLET | ORAL | Status: DC | PRN
  Administered 2011-10-27 – 2011-10-28 (×3): 2 via ORAL
  Administered 2011-10-29: 1 via ORAL
  Administered 2011-10-29: 2 via ORAL
  Administered 2011-10-30 – 2011-10-31 (×6): 1 via ORAL
  Administered 2011-10-31 – 2011-11-01 (×3): 2 via ORAL
  Administered 2011-11-02 (×3): 1 via ORAL
  Filled 2011-10-27: qty 2
  Filled 2011-10-27: qty 1
  Filled 2011-10-27 (×3): qty 2
  Filled 2011-10-27: qty 1
  Filled 2011-10-27 (×2): qty 2
  Filled 2011-10-27 (×4): qty 1
  Filled 2011-10-27: qty 2
  Filled 2011-10-27 (×2): qty 1
  Filled 2011-10-27 (×2): qty 2

## 2011-10-27 MED ORDER — FUROSEMIDE 10 MG/ML IJ SOLN
20.0000 mg | Freq: Two times a day (BID) | INTRAMUSCULAR | Status: DC
  Administered 2011-10-27 – 2011-10-28 (×3): 20 mg via INTRAVENOUS
  Filled 2011-10-27 (×5): qty 2

## 2011-10-27 MED ORDER — WARFARIN SODIUM 1 MG PO TABS
1.0000 mg | ORAL_TABLET | Freq: Once | ORAL | Status: AC
  Administered 2011-10-27: 1 mg via ORAL
  Filled 2011-10-27: qty 1

## 2011-10-27 MED ORDER — POTASSIUM CHLORIDE CRYS ER 20 MEQ PO TBCR
40.0000 meq | EXTENDED_RELEASE_TABLET | ORAL | Status: AC
  Administered 2011-10-27 (×2): 40 meq via ORAL
  Filled 2011-10-27 (×2): qty 2

## 2011-10-27 NOTE — Progress Notes (Signed)
UR Completed.  Dean [name] [id number] 10/27/2011

## 2011-10-27 NOTE — Progress Notes (Signed)
ANTICOAGULATION CONSULT NOTE - Follow Up Consult  Pharmacy Consult for coumadin Indication: h/o afib  No Known Allergies  Patient Measurements: Height: 6' (182.9 cm) Weight: 196 lb 3.4 oz (89 kg) IBW/kg (Calculated) : 77.6  Adjusted Body Weight:  Vital Signs: Temp: 99.1 F (37.3 C) (01/21 0729) Temp src: Oral (01/21 0729) BP: 114/62 mmHg (01/21 0729) Pulse Rate: 80  (01/21 0729)  Labs:  Flo Shanks 10/27/11 0455 10/26/11 0105 10/25/11 0445 10/24/11 1138  HGB -- -- 10.9* --  HCT -- -- 32.5* --  PLT -- -- 317 --  APTT -- -- -- --  LABPROT 27.1* 20.8* 20.8* --  INR 2.46* 1.76* 1.76* --  HEPARINUNFRC -- -- -- --  CREATININE -- 0.83 1.00 0.96  CKTOTAL -- -- -- --  CKMB -- -- -- --  TROPONINI -- -- -- --   Estimated Creatinine Clearance: 71.4 ml/min (by C-G formula based on Cr of 0.83).   Medications:  Scheduled:    . antiseptic oral rinse  15 mL Mouth Rinse q12n4p  . bisacodyl  10 mg Oral Daily  . chlorhexidine  15 mL Mouth Rinse BID  . diltiazem  180 mg Oral Daily  . heparin subcutaneous  5,000 Units Subcutaneous Q12H  . insulin aspart  0-9 Units Subcutaneous Q4H  . levalbuterol  0.63 mg Nebulization Q6H  . linezolid  600 mg Intravenous Q12H  . pantoprazole  40 mg Oral QHS  . warfarin  4 mg Oral ONCE-1800    Assessment: 87 YOM tx from [facility] with MRSA pna and pleural effusion,  S/p VATS.  INR has increased to 2.46 this am.  He was given 4mg  x 2 doses.  No bleeding noted  PTA regimen was 5mg  on Tues, Fri. And 4mg  on Mon, Wed, Thurs, Sat, Sun.   Goal of Therapy:  INR 2-3   Plan:  1. Due to rate INR rise, decrease warfarin dose tonight to 1mg .  2. Daily INR 3. D/C SQ heparin with INR >2  Dean [name] 10/27/2011,8:37 AM

## 2011-10-27 NOTE — Progress Notes (Signed)
1200-Pt refused removal of foley catheter. Will pass along to night shift to ask again later and reinforce education to prevent infection. Alvira Philips

## 2011-10-27 NOTE — Progress Notes (Signed)
I will follow patient's progress with therapy to assist with planning rehab venue. Please call with any questions. Pager [phone].

## 2011-10-27 NOTE — Consult Note (Signed)
Physical Medicine and Rehabilitation Consult Reason for Consult: Deconditioning/sepsis/pneumonia Referring Phsyician: Critical care Dean [name] is an 78 y.o. male.   HPI: 78 year old white right-handed male initially admitted ran a hospital January 2 diagnosed with MRSA pneumonia with progressive pleural effusion underwent thoracentesis on January 8 and ultimately transferred to [place] on January 10 for further evaluation. Upon evaluation of atrial fibrillation with rapid ventricular rate as well as progressive leukocytosis. Patient been on chronic Coumadin therapy prior to admission for atrial fibrillation. Echocardiogram on January 12 with ejection fraction [id number] preserved systolic function mild aortic stenosis. Placed on broad-spectrum antibiotics. Patient developed large right pleural effusion underwent chest tube insertion by Dr. [name] on January 14. Patient clinically unchanged after insertion of chest tube and ultimately underwent right VATS procedure with drainage of empyema on January 17 per Dr. Caffie Pinto. Chest tube currently remains intact. Patient +2 total assist for sit stand and stand to sit. Ambulating 5 feet with +2 total assist (80%) and easily fatigued. Occupational therapy evaluation pending. Patient with severe deconditioning and inpatient rehabilitation was consulted at the recommendations of physical therapy Review of Systems  Constitutional: Positive for malaise/fatigue.  Eyes: Negative for double vision.  Respiratory: Positive for cough and shortness of breath.   Cardiovascular: Negative for chest pain.  Gastrointestinal: Positive for constipation. Negative for nausea.  Genitourinary: Negative.   Musculoskeletal: Positive for myalgias and back pain.  Neurological: Negative for dizziness and headaches.   Past Medical History  Diagnosis Date  . PAF (paroxysmal atrial fibrillation)     followed by [place] cardiology  . Aortic valve disease     followed by [facility]  cardiology  . Prostate cancer     s/p seed implants  . Environmental allergies   . Asthma   . Essential thrombocytopenia     followed by Dr Bobby Rumpf (hematology)  . Neuropathy   . Insomnia   . Gout   . Obesity   . Hypertension   . Hyperlipidemia   . Osteoarthritis   . Hypoxia, sleep related    No past surgical history on file. Family History  Problem Relation Age of Onset  . Asthma Maternal Grandmother   . Tuberculosis Maternal Grandmother   . Heart disease Father     died of heart disease  . Alzheimer's disease Mother    Social History:  reports that he has never smoked. He does not have any smokeless tobacco history on file. His alcohol and drug histories not on file. Allergies: No Known Allergies Medications Prior to Admission  Medication Dose Route Frequency Provider Last Rate Last Dose  . 0.9 %  sodium chloride infusion   Intravenous Continuous Lester [place], MD 20 mL/hr at 10/27/11 0500    . amiodarone (NEXTERONE) 1.8 mg/mL load via infusion 150 mg  150 mg Intravenous Once Dorian Heckle, MD 500 mL/hr at 10/16/11 1715 150 mg at 10/16/11 1715   And  . amiodarone (NEXTERONE PREMIX) 360 mg/200 mL dextrose IV infusion  1 mg/min Intravenous Continuous Dorian Heckle, MD 33.3 mL/hr at 10/16/11 2138 1 mg/min at 10/16/11 2138  . amiodarone (NEXTERONE) 1.8 mg/mL load via infusion 150 mg  150 mg Intravenous Once Lester [place], MD 500 mL/hr at 10/20/11 1345 150 mg at 10/20/11 1345   And  . amiodarone (NEXTERONE PREMIX) 360 mg/200 mL dextrose IV infusion  1 mg/min Intravenous Continuous Lester , MD 33.3 mL/hr at 10/20/11 1504 1 mg/min at 10/20/11 1504  . antiseptic oral rinse (BIOTENE) solution 15 mL  15 mL  Mouth Rinse q12n4p Doree Fudge, MD   15 mL at 10/26/11 1533  . bisacodyl (DULCOLAX) EC tablet 10 mg  10 mg Oral Daily Gaye Pollack, MD   10 mg at 10/25/11 1224  . chlorhexidine (PERIDEX) 0.12 % solution 15 mL  15 mL Mouth Rinse BID Doree Fudge, MD    15 mL at 10/26/11 2000  . diltiazem (CARDIZEM CD) 24 hr capsule 180 mg  180 mg Oral Daily Brand Males, MD   180 mg at 10/26/11 1013  . erythromycin (ERY-TAB) EC tablet 500 mg  500 mg Oral Q8H Jennet Maduro, MD   500 mg at 10/17/11 0708  . fentaNYL (SUBLIMAZE) 0.05 MG/ML injection        100 mcg at 10/19/11 1624  . fentaNYL (SUBLIMAZE) 0.05 MG/ML injection        50 mcg at 10/20/11 1615  . fentaNYL (SUBLIMAZE) injection 25-100 mcg  25-100 mcg Intravenous Q2H PRN Brand Males, MD   50 mcg at 10/27/11 0828  . furosemide (LASIX) 10 MG/ML injection           . furosemide (LASIX) 10 MG/ML injection           . furosemide (LASIX) 10 MG/ML injection           . heparin bolus via infusion 2,000 Units  2,000 Units Intravenous Once Levy Sjogren, PHARMD   2,000 Units at 10/16/11 1445  . heparin bolus via infusion 2,000 Units  2,000 Units Intravenous Once [facility] Abbott, PHARMD   2,000 Units at 10/17/11 0149  . heparin bolus via infusion 3,000 Units  3,000 Units Intravenous Once [name], MD   3,000 Units at 10/17/11 1245  . heparin injection 5,000 Units  5,000 Units Subcutaneous Q12H Na Li, MD   5,000 Units at 10/26/11 2135  . insulin aspart (novoLOG) injection 0-9 Units  0-9 Units Subcutaneous Q4H Cherene Altes, MD   1 Units at 10/27/11 0100  . iohexol (OMNIPAQUE) 300 MG/ML solution 20 mL  20 mL Intravenous Q1 Hr x 2 Medication Radiologist, MD      . levalbuterol (XOPENEX) nebulizer solution 0.63 mg  0.63 mg Nebulization Q6H Cherene Altes, MD   0.63 mg at 10/27/11 0247  . lidocaine (XYLOCAINE) 2 % viscous mouth solution 15 mL  15 mL Mouth/Throat Once Cherene Altes, MD   15 mL at 10/19/11 1624  . linezolid (ZYVOX) IVPB 600 mg  600 mg Intravenous Q12H Daniel J. [name], MD   600 mg at 10/26/11 2133  . LORazepam (ATIVAN) 2 MG/ML injection        4 mg at 10/23/11 1250  . LORazepam (ATIVAN) injection 4 mg  4 mg Intravenous Once Lester [place], MD   4 mg at 10/23/11 1250  .  magnesium citrate solution 1 Bottle  1 Bottle Oral Once Jennet Maduro, MD   1 Bottle at 10/16/11 1806  . midazolam (VERSED) 2 MG/2ML injection        2 mg at 10/19/11 1625  . midazolam (VERSED) 2 MG/2ML injection           . naloxone (NARCAN) 0.4 MG/ML injection        0.4 mg at 10/19/11 1625  . ondansetron (ZOFRAN) injection 4 mg  4 mg Intravenous Q6H PRN Gaye Pollack, MD      . pantoprazole (PROTONIX) EC tablet 40 mg  40 mg Oral QHS Charmian Muff [place], PHARMD   40 mg at 10/26/11 2136  .  polyethylene glycol (MIRALAX / GLYCOLAX) packet 17 g  17 g Oral Daily Jennet Maduro, MD   17 g at 10/18/11 0934  . potassium chloride 20 MEQ/15ML (10%) liquid 40 mEq  40 mEq Oral Q4H Rico Sheehan, MD   40 mEq at 10/26/11 0622  . potassium chloride SA (K-DUR,KLOR-CON) 20 MEQ CR tablet           . potassium chloride SA (K-DUR,KLOR-CON) CR tablet 40 mEq  40 mEq Oral Once Elnora Morrison, MD   40 mEq at 10/21/11 1019  . potassium chloride SA (K-DUR,KLOR-CON) CR tablet 40 mEq  40 mEq Oral Once Lester [place], MD   40 mEq at 10/24/11 1000  . potassium chloride SA (K-DUR,KLOR-CON) CR tablet 40 mEq  40 mEq Oral Once Doree Fudge, MD   40 mEq at 10/25/11 0800  . potassium chloride SA (K-DUR,KLOR-CON) CR tablet 40 mEq  40 mEq Oral Once Brand Males, MD   40 mEq at 10/25/11 1100  . senna-docusate (Senokot-S) tablet 1 tablet  1 tablet Oral QHS PRN Gaye Pollack, MD      . simethicone [facility]) chewable tablet 80 mg  80 mg Oral QID PRN Marlyce Huge, MD      . sodium chloride 0.9 % injection        40 mL at 10/19/11 1625  . sodium chloride 0.9 % injection           . vancomycin (VANCOCIN) 750 mg in sodium chloride 0.9 % 150 mL IVPB  750 mg Intravenous NOW Levy Sjogren, PHARMD   750 mg at 10/16/11 1443  . warfarin (COUMADIN) tablet 4 mg  4 mg Oral ONCE-1800 Charmian Muff [place], PHARMD   4 mg at 10/25/11 1734  . warfarin (COUMADIN) tablet 4 mg  4 mg Oral ONCE-1800 Biagio Borg, PHARMD   4 mg at  10/26/11 1827  . white petrolatum (VASELINE) gel           . DISCONTD: 0.9 %  sodium chloride infusion   Intravenous Continuous Lester [place], MD      . DISCONTD: 0.9 % irrigation (POUR BTL)    PRN Gaye Pollack, MD   1,000 mL at 10/23/11 0720  . DISCONTD: 0.9 % NaCl with KCl 40 mEq / L  infusion   Intravenous Continuous Lester [place], MD      . DISCONTD: acetaminophen (TYLENOL) tablet 650 mg  650 mg Oral Q6H PRN Marni Griffon, NP   650 mg at 10/22/11 2229  . DISCONTD: acetylcysteine (MUCOMYST) 20 % nebulizer solution 4 mL  4 mL Nebulization BID Jolene Provost, MD   4 mL at 10/22/11 2055  . DISCONTD: albuterol (PROVENTIL) (5 MG/ML) 0.5% nebulizer solution 2.5 mg  2.5 mg Nebulization Q6H Marni Griffon, NP   2.5 mg at 10/19/11 0733  . DISCONTD: amiodarone (NEXTERONE PREMIX) 360 mg/200 mL dextrose IV infusion  0.5 mg/min Intravenous Continuous Dorian Heckle, MD 16.7 mL/hr at 10/17/11 0836 0.5 mg/min at 10/17/11 0836  . DISCONTD: amiodarone (NEXTERONE PREMIX) 360 mg/200 mL dextrose IV infusion  60 mg/hr Intravenous Continuous Elnora Morrison, MD   59.94 mg/hr at 10/17/11 1400  . DISCONTD: amiodarone (NEXTERONE PREMIX) 360 mg/200 mL dextrose IV infusion  0.5 mg/min Intravenous Continuous Lester , MD 16.7 mL/hr at 10/25/11 0109 0.5 mg/min at 10/25/11 0109  . DISCONTD: amiodarone (NEXTERONE) 1.8 mg/mL load via infusion 150 mg  150 mg Intravenous Once Marni Griffon, NP      . DISCONTD: chlorhexidine (PERIDEX) 0.12 %  solution 15 mL  15 mL Mouth/Throat BID Luretha Murphy [place], [place]   15 mL at 10/24/11 2100  . DISCONTD: dexmedetomidine (PRECEDEX) 200 mcg in sodium chloride 0.9 % 50 mL infusion  0.2-1.2 mcg/kg/hr Intravenous Continuous Madhav Devani, MD 8.7 mL/hr at 10/24/11 0800 0.4 mcg/kg/hr at 10/24/11 0800  . DISCONTD: dextrose 5 %-0.9 % sodium chloride infusion   Intravenous Continuous Gaye Pollack, MD      . DISCONTD: diltiazem (CARDIZEM) 100 mg in dextrose 5 % 100 mL infusion  5-20 mg/hr Intravenous  Titrated Marni Griffon, NP 20 mL/hr at 10/16/11 1403 20 mg/hr at 10/16/11 1403  . DISCONTD: diltiazem (CARDIZEM) 100 mg in dextrose 5 % 100 mL infusion  5-15 mg/hr Intravenous Titrated Dianne Dun 12 mL/hr at 10/21/11 0726 12 mg/hr at 10/21/11 0726  . DISCONTD: diltiazem (CARDIZEM) tablet 30 mg  30 mg Oral Q6H Elnora Morrison, MD      . DISCONTD: diltiazem (CARDIZEM) tablet 60 mg  60 mg Oral Q8H Daniel J. [name], MD   60 mg at 10/19/11 0510  . DISCONTD: EPINEPHrine (ADRENALIN) 0.1 MG/ML injection           . DISCONTD: fentaNYL (SUBLIMAZE) 10 mcg/mL in sodium chloride 0.9 % 250 mL infusion  50-300 mcg/hr Intravenous Titrated Lester [place], MD      . DISCONTD: fentaNYL (SUBLIMAZE) bolus via infusion 50-100 mcg  50-100 mcg Intravenous Q6H PRN Lester [place], MD      . DISCONTD: fentaNYL (SUBLIMAZE) injection 25-50 mcg  25-50 mcg Intravenous Q2H PRN Lester [place], MD   50 mcg at 10/24/11 1248  . DISCONTD: fentaNYL (SUBLIMAZE) injection 50-100 mcg  50-100 mcg Intravenous PRN Sydnee Levans, MD      . DISCONTD: fentaNYL (SUBLIMAZE) injection 50-100 mcg  50-100 mcg Intravenous Q2H PRN Lester [place], MD   100 mcg at 10/23/11 1533  . DISCONTD: furosemide (LASIX) injection 40 mg  40 mg Intravenous Q8H Lester [place], MD   40 mg at 10/22/11 0554  . DISCONTD: furosemide (LASIX) injection 40 mg  40 mg Intravenous Daily [name], [place]: hemostatic agents    PRN Gaye Pollack, MD   1 application at [phone]  . DISCONTD: heparin ADULT infusion 100 units/ml (25000 units/250 ml)  1,650 Units/hr Intravenous Continuous Bronson Curb Abbott, PHARMD 16.5 mL/hr at 10/17/11 0148 1,650 Units/hr at 10/17/11 0148  . DISCONTD: heparin ADULT infusion 100 units/ml (25000 units/250 ml)  1,850 Units/hr Intravenous Continuous [name], MD 18.5 mL/hr at 10/17/11 2100 18.5 mL/hr at 10/17/11 2100  . DISCONTD: heparin ADULT infusion 100 units/ml (25000 units/250 ml)  2,300 Units/hr  Intravenous Continuous Otila Back, PHARMD 23 mL/hr at 10/19/11 0640 2,300 Units/hr at 10/19/11 0640  . DISCONTD: heparin bolus via infusion 4,000 Units  4,000 Units Intravenous Once Levy Sjogren, [place]: HYDROmorphone (DILAUDID) injection 0.25-0.5 mg  0.25-0.5 mg Intravenous Q5 min PRN Janeece Riggers, MD      . DISCONTD: hydroxyurea (HYDREA) capsule 500 mg  500 mg Oral QODAY Cherene Altes, MD   500 mg at 10/22/11 1230  . DISCONTD: imipenem-cilastatin (PRIMAXIN) 500 mg in sodium chloride 0.9 % 100 mL IVPB  500 mg Intravenous Q8H Daniel J. [name], MD   500 mg at 10/22/11 0553  . DISCONTD: insulin aspart (novoLOG) injection 1-4 Units  1-4 Units Subcutaneous Q4H PRN Marni Griffon, NP   1 Units at 10/19/11 0817  .  DISCONTD: midazolam (VERSED) injection 1-2 mg  1-2 mg Intravenous PRN Sydnee Levans, MD      . DISCONTD: midazolam (VERSED) injection 2-4 mg  2-4 mg Intravenous Q2H PRN Lester [place], MD      . DISCONTD: midazolam (VERSED) injection 2-4 mg  2-4 mg Intravenous Q2H PRN Lester [place], MD      . DISCONTD: morphine 2 MG/ML injection 1-2 mg  1-2 mg Intravenous Q3H PRN Cherene Altes, MD   2 mg at 10/22/11 0515  . DISCONTD: morphine 2 MG/ML injection 2 mg  2 mg Intravenous Q1H PRN Gaye Pollack, MD      . DISCONTD: ondansetron (ZOFRAN) injection 4 mg  4 mg Intravenous Once PRN Janeece Riggers, MD      . DISCONTD: oxyCODONE (Oxy IR/ROXICODONE) immediate release tablet 5-10 mg  5-10 mg Oral Q4H PRN Cherene Altes, MD   10 mg at 10/19/11 1038  . DISCONTD: oxyCODONE (Oxy IR/ROXICODONE) immediate release tablet 5-10 mg  5-10 mg Oral Q4H PRN Gaye Pollack, MD      . DISCONTD: oxyCODONE-acetaminophen (PERCOCET) 5-325 MG per tablet 1 tablet  1 tablet Oral Q4H PRN Elnora Morrison, MD      . DISCONTD: oxyCODONE-acetaminophen (PERCOCET) 5-325 MG per tablet 1-2 tablet  1-2 tablet Oral Q4H PRN Gaye Pollack, MD      . DISCONTD: pantoprazole (PROTONIX) EC tablet 40  mg  40 mg Oral Q1200 Cherene Altes, MD      . DISCONTD: pantoprazole (PROTONIX) injection 40 mg  40 mg Intravenous Q12H Marni Griffon, NP   40 mg at 10/18/11 0950  . DISCONTD: pantoprazole (PROTONIX) injection 40 mg  40 mg Intravenous Q24H Cherene Altes, MD   40 mg at 10/24/11 2100  . DISCONTD: piperacillin-tazobactam (ZOSYN) IVPB 3.375 g  3.375 g Intravenous Q8H Marni Griffon, NP   3.375 g at 10/19/11 1342  . DISCONTD: potassium chloride 10 mEq in 50 mL *CENTRAL LINE* IVPB  10 mEq Intravenous Daily PRN Lester [place], MD   10 mEq at 10/23/11 1400  . DISCONTD: potassium chloride 20 MEQ/15ML (10%) liquid           . DISCONTD: Tamsulosin HCl (FLOMAX) capsule 0.4 mg  0.4 mg Oral QPC supper Cherene Altes, MD   0.4 mg at 10/22/11 1800  . DISCONTD: vancomycin (VANCOCIN) 750 mg in sodium chloride 0.9 % 150 mL IVPB  750 mg Intravenous Q12H Meera K Patel, PHARMD      . DISCONTD: vancomycin (VANCOCIN) 750 mg in sodium chloride 0.9 % 150 mL IVPB  750 mg Intravenous Q12H Levy Sjogren, PHARMD   750 mg at 10/19/11 1501   Medications Prior to Admission  Medication Sig Dispense Refill  . albuterol (ACCUNEB) 1.25 MG/3ML nebulizer solution Take 1 ampule by nebulization 2 (two) times daily.      Marland Kitchen allopurinol (ZYLOPRIM) 300 MG tablet Take 300 mg by mouth daily.      . budesonide (PULMICORT) 0.25 MG/2ML nebulizer solution Take 0.25 mg by nebulization 2 (two) times daily.      . diclofenac sodium (VOLTAREN) 1 % GEL Apply 1 application topically daily as needed.      . diltiazem (CARDIZEM CD) 180 MG 24 hr capsule Take 180 mg by mouth daily.      . hydroxyurea (HYDREA) 500 MG capsule Take 500 mg by mouth every other day. May take with food to minimize GI side effects.      . pregabalin (LYRICA)  50 MG capsule Take 50 mg by mouth daily as needed.      . simvastatin (ZOCOR) 20 MG tablet Take 10 mg by mouth every evening.      . warfarin (COUMADIN) 4 MG tablet Take 4 mg by mouth See admin instructions. 4mg  on  Monday, Wednesday, Thursday, Saturday, Sunday      . warfarin (COUMADIN) 5 MG tablet Take 5 mg by mouth See admin instructions. Tuesday, Friday      . zolpidem (AMBIEN) 5 MG tablet Take 5 mg by mouth at bedtime as needed.        Home: Home Living Lives With: Spouse;Daughter Receives Help From: Family Type of Home: House Home Layout: One level (with basement) Home Access: Stairs to enter Entrance Stairs-Rails: None Entrance Stairs-Number of Steps: 1 Home Adaptive Equipment: None  Functional History: Prior Function Level of Independence: Independent with basic ADLs;Independent with homemaking with ambulation;Independent with gait;Independent with transfers Able to Take Stairs?: Reciprically Driving: Yes Functional Status:  Mobility: Bed Mobility Bed Mobility: Yes Rolling Right: 3: Mod assist;With rail Rolling Right Details (indicate cue type and reason): cues for use of rail and movement of LEs Rolling Left: 3: Mod assist;With rail Right Sidelying to Sit: 3: Mod assist;HOB elevated (comment degrees) (HOB ~35 degrees) Right Sidelying to Sit Details (indicate cue type and reason): cues for use of UEs and sequencing Sitting - Scoot to Edge of Bed: 2: Max assist Transfers Transfers: Yes Sit to Stand: 1: +2 Total assist;With upper extremity assist;Patient percentage (comment);From bed (pt 60%) Sit to Stand Details (indicate cue type and reason): cues for use of UEs, anterior wt shift Stand to Sit: 1: +2 Total assist;Patient percentage (comment);With upper extremity assist;To chair/3-in-1 (pt 70%) Stand to Sit Details: cues for use of UEs, control descent Ambulation/Gait Ambulation/Gait: Yes Ambulation/Gait Assistance: 1: +2 Total assist;Patient percentage (comment) (pt 80%) Ambulation/Gait Assistance Details (indicate cue type and reason): pt fatigues very quickly, but motivated for ambulation.  +2 needed for safety and multiple lines and equip.   Ambulation Distance (Feet): 5  Feet Assistive device: Rolling walker Gait Pattern: Step-through pattern;Decreased stride length;Trunk flexed Stairs: No Wheelchair Mobility Wheelchair Mobility: No  ADL:    Cognition: Cognition Orientation Level: Oriented X4 Cognition Orientation Level: Oriented X4  Blood pressure 114/62, pulse 80, temperature 99.1 F (37.3 C), temperature source Oral, resp. rate 18, height 6' (1.829 m), weight 89 kg (196 lb 3.4 oz), SpO2 94.00%. Physical Exam  Constitutional: He is oriented to person, place, and time. He appears well-developed.       Generally frail appearing  HENT:  Head: Normocephalic.  Eyes: EOM are normal. Pupils are equal, round, and reactive to light.  Neck: Normal range of motion. Neck supple. No thyromegaly present.  Cardiovascular: Normal rate and normal heart sounds.        Rate control  Pulmonary/Chest: Effort normal. He has wheezes.  Abdominal: He exhibits no distension. There is no tenderness.  Neurological: He is alert and oriented to person, place, and time.       Patient's alert and oriented. He is wearing hearing aids and is still having problems with accurate hearing regardless. Otherwise cognitively he is appropriate. Cranial nerve exam is intact. Strength in the upper extremities is 3+ out of 5 at the shoulder girdle to 4/5 distally. In the legs he 2+ to 3/5 proximally to 4/5 distally. No sensory deficits were seen. He does have diminished reflexes throughout.  Skin:       Chest tube  intact  Psychiatric: He has a normal mood and affect.    Results for orders placed during the hospital encounter of 10/16/11 (from the past 24 hour(s))  GLUCOSE, CAPILLARY     Status: Abnormal   Collection Time   10/26/11 12:04 PM      Component Value Range   Glucose-Capillary 127 (*) 70 - 99 (mg/dL)   Comment 1 Documented in Chart     Comment 2 Notify RN    GLUCOSE, CAPILLARY     Status: Abnormal   Collection Time   10/26/11  4:46 PM      Component Value Range    Glucose-Capillary 127 (*) 70 - 99 (mg/dL)   Comment 1 Documented in Chart     Comment 2 Notify RN    GLUCOSE, CAPILLARY     Status: Abnormal   Collection Time   10/26/11  9:30 PM      Component Value Range   Glucose-Capillary 103 (*) 70 - 99 (mg/dL)  GLUCOSE, CAPILLARY     Status: Abnormal   Collection Time   10/26/11 11:46 PM      Component Value Range   Glucose-Capillary 141 (*) 70 - 99 (mg/dL)  GLUCOSE, CAPILLARY     Status: Normal   Collection Time   10/27/11  4:49 AM      Component Value Range   Glucose-Capillary 95  70 - 99 (mg/dL)  PROTIME-INR     Status: Abnormal   Collection Time   10/27/11  4:55 AM      Component Value Range   Prothrombin Time 27.1 (*) 11.6 - 15.2 (seconds)   INR 2.46 (*) 0.00 - 1.49   GLUCOSE, CAPILLARY     Status: Abnormal   Collection Time   10/27/11  8:32 AM      Component Value Range   Glucose-Capillary 101 (*) 70 - 99 (mg/dL)   Comment 1 Notify RN     Dg Chest Port 1 View  10/27/2011  *RADIOLOGY REPORT*  Clinical Data: Evaluate right pleural effusion and right chest tube  PORTABLE CHEST - 1 VIEW  Comparison: 10/25/2011  Findings: Cardiomediastinal silhouette is stable.  Stable right chest tube position.  Stable right IJ central line position.  Small right pleural effusion with right basilar atelectasis or infiltrate.  Trace left pleural effusion with left basilar atelectasis.  No pulmonary edema. No diagnostic pneumothorax.  IMPRESSION:  Stable right chest tube position.  Stable right IJ central line position.  Small right pleural effusion with right basilar atelectasis or infiltrate.  Trace left pleural effusion with left basilar atelectasis.  No pulmonary edema. No diagnostic pneumothorax.  Original Report Authenticated By: Lahoma Crocker, M.D.   Dg Abd Acute W/chest  10/26/2011  *RADIOLOGY REPORT*  Clinical Data: Pain around chest tube, abdominal distension  ACUTE ABDOMEN SERIES (ABDOMEN 2 VIEW & CHEST 1 VIEW)  Comparison: CT chest abdomen pelvis dated  10/16/2011  Findings: Right apical and basilar chest tubes.  No pneumothorax is seen.  Cardiomegaly with possible mild interstitial edema, equivocal. Right lower lobe opacity, likely a combination of atelectasis and pleural effusion.  Right IJ venous catheter.  Skin staples overlying the right lower chest wall.  Nonobstructive bowel gas pattern.  No evidence of free air on the lateral decubitus view.  Cholecystectomy clips.  Brachytherapy seeds in the prostate.  Degenerative changes of the visualized thoracolumbar spine.  IMPRESSION: Right apical and basilar chest tubes.  No pneumothorax is seen.  Cardiomegaly with possible mild interstitial edema, equivocal. Right  pleural effusion with associated lower lobe atelectasis.  No evidence of small bowel obstruction or free air.  Original Report Authenticated By: Julian Hy, M.D.    Assessment/Plan: Diagnosis: Deconditioning related to pneumonia and pleural effusions 1. Does the need for close, 24 hr/day medical supervision in concert with the patient's rehab needs make it unreasonable for this patient to be served in a less intensive setting? Yes 2. Co-Morbidities requiring supervision/potential complications: History of atrial fibrillation, hyponatremia, asthma, ileus 3. Due to bladder management, bowel management, safety, skin/wound care, disease management, medication administration, pain management and patient education, does the patient require 24 hr/day rehab nursing? Yes 4. Does the patient require coordinated care of a physician, rehab nurse, PT (1-2 hrs/day, 5 days/week) and OT (1-2 hrs/day, 5 days/week) to address physical and functional deficits in the context of the above medical diagnosis(es)? Yes Addressing deficits in the following areas: balance, bathing, dressing, endurance, grooming, locomotion, psychosocial adjustment, strength, toileting and transferring 5. Can the patient actively participate in an intensive therapy program of at  least 3 hrs of therapy per day at least 5 days per week? Yes and Potentially 6. The potential for patient to make measurable gains while on inpatient rehab is excellent 7. Anticipated functional outcomes upon discharge from inpatients are supervision to modified independent PT, supervision to modified independent OT,  8. Estimated rehab length of stay to reach the above functional goals is: 2 weeks plus 9. Does the patient have adequate social supports to accommodate these discharge functional goals? Yes and Potentially 10. Anticipated D/C setting: Home 11. Anticipated post D/C treatments: Purdin therapy 12. Overall Rehab/Functional Prognosis: excellent  RECOMMENDATIONS: This patient's condition is appropriate for continued rehabilitative care in the following setting: CIR Patient has agreed to participate in recommended program. Yes Note that insurance prior authorization may be required for reimbursement for recommended care.  Comment: We'll followup regarding social supports and medical stability. He should should do well in the inpatient rehabilitation setting.    Oval Linsey, MD 10/27/2011

## 2011-10-27 NOTE — Progress Notes (Signed)
PT Cancellation Note  Treatment cancelled today due to pt declined PT this am secondary to needing a breathing treatment and not feeling up to it.  Will try another time.    Catarina Hartshorn, [place] 10/27/2011, 12:13 PM

## 2011-10-27 NOTE — Progress Notes (Signed)
Name: Dean [name] MRN: [id number] DOB: 1924-09-21    LOS: 67  [place] PCCM Progress Note  Brief patient profile:  87 yowm to [place] [facility] on 1/2. Dx eval demonstrated MRSA PNA.Had progressive pleural effusion with thoracentesis on 1/8 showing exudative  > to [facility] on 1/10 for further evaluation of  worsening of Right sided airspace disease, increased work of breathing, AF w/ RVR and progressive leukocytosis.   Lines / Drains: Rt chest tube 1/14 >>>VATS,drainage 1/17>>> Rt IJ 1/16>>> ETT 1/17> 1/18   Micro/Sepsis c-diff pcr 1/10>>neg  MRSA pcr>> +ve Sputum 1/7>>>MRSA  Influenza PCR1/2>>> Negative BAL 1/13>>>MRSA R Pleural 1/17 >  MRSA   Antibiotics: Vanco 1/5>>>1/13 Zosyn 1/10>>>1/13 Levaquin 1/2>>>1/10 Rocephin 1/2>>>1/10  primaxin 1/13>>>1/16 zyvox (MRSA PNA/ empyema)1/13>>>    Tests / Events: 1/10 CT Chest: increase in right pleural effusion, mucous plugging 1/10 CT abd: no evidence of bowel obstruction or ileus 1/8: thoracentesis on 1/8 yielding a exudative sample consistent with a parapneumonia effusion.   ECHO 10/17/2010: EF 55%, preserved systolic function. Mild aortic stenosis.  1/10- no distress 1/13 - resp distress, hypoxia, complete right lung collapse. S/p Bronchoscopy (DF)- obstructed rt main with blood clot mixed with pus.  1/14- prior exudate knonw, increased effusion, chest tube placed, hydro pneurmo improved 1/14 - CT chest - hydro PNA, PNA 1/14- amio strted 1/15- improved Rt lung aeration post Chest tube placement 1/16- cardizem drip stopped, breathing unchanged, gross tolerated neg balance, no distress 1/17- s/p VATS, empyema drainage, rt lung decortication.  vent post op  Subjective/Overnight event:   on low O2, pos balance   Vital Signs: Temp:  [98.7 F (37.1 C)-99.7 F (37.6 C)] 99.1 F (37.3 C) (01/21 0729) Pulse Rate:  [74-84] 80  (01/21 0729) Resp:  [14-21] 18  (01/21 0729) BP: (93-120)/(50-66) 114/62 mmHg (01/21 0729) SpO2:  [87  %-95 %] 95 % (01/21 0943) FiO2 (%):  [32 %] 32 % (01/20 1455) Weight:  [89 kg (196 lb 3.4 oz)] 89 kg (196 lb 3.4 oz) (01/21 0500)    Physical Examination: General:  Elderly male,no distress Neuro: alert/ oriented  though responses hesitant/slowed HEENT:  Supple,no , Rt IJ clean Cardiovascular: S1 S2 , RRR Respic: reduced right, no wheezing, rt chest tube drains x 2, ronchi Abdomen:  Non tender to palpation.. BS diminished Musculoskeletal: no edema   Labs   Lab 10/26/11 0105 10/25/11 0445 10/24/11 1138  NA 132* 133* 134*  K 3.2* 3.1* 3.5  CL 97 95* 97  CO2 30 31 31   BUN 11 12 13   CREATININE 0.83 1.00 0.96  GLUCOSE 131* 113* 122*    Lab 10/25/11 0445 10/24/11 0500 10/23/11 1200  HGB 10.9* 11.3* 12.4*  HCT 32.5* 34.8* 36.1*  WBC 10.8* 13.0* 14.6*  PLT 317 367 366        Dg Abd Acute W/chest  10/26/2011   IMPRESSION: Right apical and basilar chest tubes.  No pneumothorax is seen. Rt base atx / infiltrate   Assessment and Plan:  MRSA PNA,  Empyema,  Hypoxic resp failure, on vent post op, was on 100% NRB prior to VATS. s/p VATS 1/17, Rt lung decortication and empyema drainage, currently on 2 lit  PLAN -continue pulmonary toilet - titrate fio2 for pulse ox > 92% - chest tubes output low, per CVTS -IS - restart lasix to neg balance  Abdominal distention/Ileus. In the setting of recent hospital stay and quinolone therapy Assessment: loose stool, distended belly, diminished bowel sounds, bloody drainage in OG on  1/18. AXR - no ileus 1/18 Tolerating clears  ileus Plan - keep K > 4, done -mag wnl -ambulate when bale ppi  A-fib with RVR  Assessment : NSR, rate normal Plan: -on amio drip since 1/14 (startd by PCCM due to tachycardia despite cardizem and soft BP)    Off 1/19     Remains in SR and controlled  PLAN  home cardizem 180mg  CD24h  -SQ heparin dvt proph since 1/18  - po coumadin intiated 1/18  Sepsis- primarily PNA/Parapneumonic effusion, possible  colitis, r/o empyema. BAL MRSA Assessment: see problem 1 Plan -linazolid (minimum 14 days)  Per flow sheet  PAIN   - from chest tube site PLAN  fent prn dc to po percocet  Urinary retention Plan: foley, consider dc   GERD -PPI -clears, advance  HYPOKALEMIA  Lab 10/26/11 0105 10/25/11 0445 10/24/11 1138 10/24/11 0500 10/23/11 1200  K 3.2* 3.1* 3.5 3.4* 3.6  PLAN Replete x 2 Check mag and phos 1/21 kvo lasix   Best practices / Disposition: -->  SDU under PCCM; d -->full code --> SCD  -->Protonix for GI Px -->clears to full 1/21   [name], MD, [place] Pgr: [facility]

## 2011-10-27 NOTE — Progress Notes (Signed)
PT Cancellation Note  Treatment cancelled today due to pt now on bedpan and requests PT another time.    Catarina Hartshorn, [place] 10/27/2011, 2:47 PM

## 2011-10-28 ENCOUNTER — Inpatient Hospital Stay (HOSPITAL_COMMUNITY): Payer: Medicare Other

## 2011-10-28 DIAGNOSIS — J9819 Other pulmonary collapse: Secondary | ICD-10-CM

## 2011-10-28 DIAGNOSIS — J15212 Pneumonia due to Methicillin resistant Staphylococcus aureus: Secondary | ICD-10-CM

## 2011-10-28 DIAGNOSIS — J13 Pneumonia due to Streptococcus pneumoniae: Secondary | ICD-10-CM

## 2011-10-28 DIAGNOSIS — J90 Pleural effusion, not elsewhere classified: Secondary | ICD-10-CM

## 2011-10-28 LAB — CBC
HCT: 34.2 % — ABNORMAL LOW (ref 39.0–52.0)
Hemoglobin: 11.1 g/dL — ABNORMAL LOW (ref 13.0–17.0)
MCH: 31.7 pg (ref 26.0–34.0)
MCV: 97.7 fL (ref 78.0–100.0)
Platelets: 374 10*3/uL (ref 150–400)
RBC: 3.5 MIL/uL — ABNORMAL LOW (ref 4.22–5.81)
WBC: 7 10*3/uL (ref 4.0–10.5)

## 2011-10-28 LAB — BASIC METABOLIC PANEL
CO2: 31 mEq/L (ref 19–32)
Chloride: 98 mEq/L (ref 96–112)
Glucose, Bld: 116 mg/dL — ABNORMAL HIGH (ref 70–99)
Potassium: 4.1 mEq/L (ref 3.5–5.1)
Sodium: 136 mEq/L (ref 135–145)

## 2011-10-28 LAB — DIFFERENTIAL
Eosinophils Absolute: 0.1 10*3/uL (ref 0.0–0.7)
Eosinophils Relative: 2 % (ref 0–5)
Lymphocytes Relative: 12 % (ref 12–46)
Lymphs Abs: 0.8 10*3/uL (ref 0.7–4.0)
Monocytes Absolute: 0.7 10*3/uL (ref 0.1–1.0)
Monocytes Relative: 10 % (ref 3–12)

## 2011-10-28 LAB — GLUCOSE, CAPILLARY
Glucose-Capillary: 125 mg/dL — ABNORMAL HIGH (ref 70–99)
Glucose-Capillary: 112 mg/dL — ABNORMAL HIGH (ref 70–99)
Glucose-Capillary: 148 mg/dL — ABNORMAL HIGH (ref 70–99)
Glucose-Capillary: 113 mg/dL — ABNORMAL HIGH (ref 70–99)

## 2011-10-28 LAB — PROTIME-INR: INR: 2.14 — ABNORMAL HIGH (ref 0.00–1.49)

## 2011-10-28 MED ORDER — FUROSEMIDE 10 MG/ML IJ SOLN
20.0000 mg | Freq: Every day | INTRAMUSCULAR | Status: DC
  Administered 2011-10-29 – 2011-11-02 (×5): 20 mg via INTRAVENOUS
  Filled 2011-10-28 (×6): qty 2

## 2011-10-28 MED ORDER — WARFARIN SODIUM 3 MG PO TABS
3.0000 mg | ORAL_TABLET | Freq: Once | ORAL | Status: AC
Start: 2011-10-28 — End: 2011-10-28
  Administered 2011-10-28: 3 mg via ORAL
  Filled 2011-10-28: qty 1

## 2011-10-28 MED ORDER — FLEET ENEMA 7-19 GM/118ML RE ENEM
1.0000 | ENEMA | Freq: Once | RECTAL | Status: AC
  Administered 2011-10-28: 1 via RECTAL
  Filled 2011-10-28: qty 1

## 2011-10-28 NOTE — Progress Notes (Signed)
                   [address]            [place] [address]          [phone]      Both chest tubes removed per routine technique.                         Patient tolerated procedure well                         PCXR pending

## 2011-10-28 NOTE — Progress Notes (Signed)
ANTICOAGULATION CONSULT NOTE - Follow Up Consult  Pharmacy Consult for coumadin Indication: h/o afib  No Known Allergies  Patient Measurements: Height: 6' (182.9 cm) Weight: 195 lb 5.2 oz (88.6 kg) IBW/kg (Calculated) : 77.6  Adjusted Body Weight:  Vital Signs: Temp: 97.7 F (36.5 C) (01/22 0817) Temp src: Oral (01/22 0817) BP: 104/59 mmHg (01/22 0817) Pulse Rate: 86  (01/22 0817)  Labs:  Basename 10/28/11 0540 10/27/11 0455 10/26/11 0105  HGB 11.1* -- --  HCT 34.2* -- --  PLT 374 -- --  APTT -- -- --  LABPROT 24.3* 27.1* 20.8*  INR 2.14* 2.46* 1.76*  HEPARINUNFRC -- -- --  CREATININE 1.04 -- 0.83  CKTOTAL -- -- --  CKMB -- -- --  TROPONINI -- -- --   Estimated Creatinine Clearance: 57 ml/min (by C-G formula based on Cr of 1.04).   Medications:  Scheduled:     . antiseptic oral rinse  15 mL Mouth Rinse q12n4p  . bisacodyl  10 mg Oral Daily  . chlorhexidine  15 mL Mouth Rinse BID  . diltiazem  180 mg Oral Daily  . furosemide  20 mg Intravenous BID  . insulin aspart  0-9 Units Subcutaneous Q4H  . levalbuterol  0.63 mg Nebulization Q6H  . linezolid  600 mg Intravenous Q12H  . pantoprazole  40 mg Oral QHS  . potassium chloride  40 mEq Oral Q4H  . warfarin  1 mg Oral ONCE-1800    Assessment: 87 YOM tx from [facility] with MRSA pna and pleural effusion,  S/p VATS.  INR = 2.14 (coumadin dose reduced last pm d/t INR rate of rise.  No bleeding noted  PTA regimen was 5mg  on Tues, Fri. And 4mg  on Mon, Wed, Thurs, Sat, Sun.   Goal of Therapy:  INR 2-3   Plan:  1. Coumadin 3mg  PO tonight 2. Daily INR  Clovis Riley 10/28/2011,9:39 AM

## 2011-10-28 NOTE — Progress Notes (Signed)
Name: Dean [name] MRN: [id number] DOB: January 18, 1924    LOS: 66  [place] PCCM Progress Note  Brief patient profile:  87 yowm to [place] hospital on 1/2. Dx eval demonstrated MRSA PNA.Had progressive pleural effusion with thoracentesis on 1/8 showing exudative  > to [facility] on 1/10 for further evaluation of  worsening of Right sided airspace disease, increased work of breathing, AF w/ RVR and progressive leukocytosis.   Lines / Drains: Rt chest tube 1/14 >>>VATS,drainage 1/17>>> Rt IJ 1/16>>> ETT 1/17> 1/18   Micro/Sepsis c-diff pcr 1/10>>neg  MRSA pcr>> +ve Sputum 1/7>>>MRSA  Influenza PCR1/2>>> Negative BAL 1/13>>>MRSA R Pleural 1/17 >  MRSA   Antibiotics: Vanco 1/5>>>1/13 Zosyn 1/10>>>1/13 Levaquin 1/2>>>1/10 Rocephin 1/2>>>1/10  primaxin 1/13>>>1/16 zyvox (MRSA PNA/ empyema)1/13>>>    Tests / Events: 1/10 CT Chest: increase in right pleural effusion, mucous plugging 1/10 CT abd: no evidence of bowel obstruction or ileus 1/8: thoracentesis on 1/8 yielding a exudative sample consistent with a parapneumonia effusion.   ECHO 10/17/2010: EF 55%, preserved systolic function. Mild aortic stenosis.  1/10- no distress 1/13 - resp distress, hypoxia, complete right lung collapse. S/p Bronchoscopy (DF)- obstructed rt main with blood clot mixed with pus.  1/14- prior exudate knonw, increased effusion, chest tube placed, hydro pneurmo improved 1/14 - CT chest - hydro PNA, PNA 1/14- amio strted 1/15- improved Rt lung aeration post Chest tube placement 1/16- cardizem drip stopped, breathing unchanged, gross tolerated neg balance, no distress 1/17- s/p VATS, empyema drainage, rt lung decortication.  vent post op  Subjective/Overnight event:   on low O2, neg balance   Vital Signs: Temp:  [97.7 F (36.5 C)-99.7 F (37.6 C)] 97.7 F (36.5 C) (01/22 0817) Pulse Rate:  [77-94] 86  (01/22 0817) Resp:  [14-23] 17  (01/22 0817) BP: (101-132)/(58-83) 132/74 mmHg (01/22 0953) SpO2:  [92  %-95 %] 94 % (01/22 0817) Weight:  [88.6 kg (195 lb 5.2 oz)] 88.6 kg (195 lb 5.2 oz) (01/22 0051)    Physical Examination: General:  Elderly male,no distress Neuro: alert/ oriented HEENT:  Supple,no , Rt IJ clean Cardiovascular: S1 S2 , RRR Respic: reduced right, no wheezing, rt chest tube drains x 2, ronchi less Abdomen:  Non tender to palpation.. BS diminished Musculoskeletal: no edema   Labs   Lab 10/28/11 0540 10/26/11 0105 10/25/11 0445  NA 136 132* 133*  K 4.1 3.2* 3.1*  CL 98 97 95*  CO2 31 30 31   BUN 10 11 12   CREATININE 1.04 0.83 1.00  GLUCOSE 116* 131* 113*    Lab 10/28/11 0540 10/25/11 0445 10/24/11 0500  HGB 11.1* 10.9* 11.3*  HCT 34.2* 32.5* 34.8*  WBC 7.0 10.8* 13.0*  PLT 374 317 367        Dg Abd Acute W/chest  10/26/2011   IMPRESSION: Right apical and basilar chest tubes.  No pneumothorax is seen. Rt base atx / infiltrate   Assessment and Plan:  MRSA PNA,  Empyema,  Hypoxic resp failure, on vent post op, was on 100% NRB prior to VATS. s/p VATS 1/17, Rt lung decortication and empyema drainage, currently on 2 lit  PLAN -continue pulmonary toilet - titrate fio2 for pulse ox > 92% - chest tubes output low, per CVTS, last note reviewed -IS - restart lasix to neg balance, was 1200 neg, will reduce to daily  Goal 500 cc neg next 24 hrs pcxr in am   Abdominal distention/Ileus. In the setting of recent hospital stay and quinolone therapy Assessment: loose stool,  distended belly, diminished bowel sounds, bloody drainage in OG on 1/18. AXR - no ileus 1/18 Tolerating clears  ileus Plan -ambulate when ok by cvts ppi BM reported x 3 Limit narcs Dulcolax supp low threhsold  A-fib with RVR  Assessment : NSR, rate normal Plan: -on amio drip since 1/14 (startd by PCCM due to tachycardia despite cardizem and soft BP)    Off 1/19     Remains in SR and controlled  PLAN  home cardizem 180mg  CD24h  -SQ heparin dvt proph since 1/18  - po coumadin  intiated 1/18--> pharmacy note reviewed  Sepsis- primarily PNA/Parapneumonic effusion, possible colitis, r/o empyema. BAL MRSA Assessment: see problem 1 Plan -linazolid (minimum 14 days)  Per flow sheet likley will extend duration , await CT removal then will establish   PAIN   - from chest tube site PLAN  fent prn dc to po percocet, limit with ileus  Urinary retention Plan: foley  dc   GERD -PPI -clears, advance  HYPOKALEMIA  Lab 10/28/11 0540 10/26/11 0105 10/25/11 0445 10/24/11 1138 10/24/11 0500  K 4.1 3.2* 3.1* 3.5 3.4*  PLAN wnl Lasix to daily   Best practices / Disposition: -->  SDU under PCCM; d -->full code --> SCD  -->Protonix for GI Px -->clears to full 1/21   [name], MD, [place] Pgr: [facility]

## 2011-10-28 NOTE — Progress Notes (Signed)
Physical Therapy Treatment Patient Details Name: Dean [name] MRN: [id number] DOB: 1924-08-28 Today's Date: 10/28/2011  PT Assessment/Plan  PT - Assessment/Plan Comments on Treatment Session: pt presents s/p VATs.  pt ambulation limited secondary to Dr Janit Pagan note stating ambulate when ok with CVTS.  Will leave note requesting CVTS to clear for ambulation.  pt motivated for mobility.   PT Plan: Discharge plan remains appropriate;Frequency remains appropriate PT Frequency: Min 3X/week Recommendations for Other Services: Rehab consult;OT consult Follow Up Recommendations: Inpatient Rehab Equipment Recommended: Defer to next venue PT Goals  Acute Rehab PT Goals PT Goal: Supine/Side to Sit - Progress: Progressing toward goal PT Goal: Sit to Supine/Side - Progress: Not met PT Goal: Sit to Stand - Progress: Progressing toward goal PT Goal: Ambulate - Progress: Progressing toward goal PT Goal: Up/Down Stairs - Progress: Not met  PT Treatment Precautions/Restrictions  Precautions Precautions: Fall (Orange Contact) Required Braces or Orthoses: No Restrictions Weight Bearing Restrictions: No Mobility (including Balance) Bed Mobility Bed Mobility: Yes Rolling Right: 3: Mod assist;With rail Rolling Right Details (indicate cue type and reason): cues for sequencing Right Sidelying to Sit: 3: Mod assist;With rails;HOB elevated (comment degrees) (HOB ~35) Right Sidelying to Sit Details (indicate cue type and reason): cues for sequencing Sitting - Scoot to Edge of Bed: 2: Max assist Transfers Transfers: Yes Sit to Stand: 1: +2 Total assist;Patient percentage (comment);With upper extremity assist;From bed (pt 60%) Sit to Stand Details (indicate cue type and reason): cues for use of UEs, anterior wt shift, hip/trunk extension Stand to Sit: 1: +2 Total assist;Patient percentage (comment);With upper extremity assist;With armrests;To chair/3-in-1 (pt 70%) Stand to Sit Details: cues for use  of armrests and control descent. Ambulation/Gait Ambulation/Gait: Yes Ambulation/Gait Assistance: 1: +2 Total assist;Patient percentage (comment) (pt 80%) Ambulation/Gait Assistance Details (indicate cue type and reason): Pivotal steps only.  pt fatigued, but eager for OOB.   Ambulation Distance (Feet):  (pivotal steps only.  ) Assistive device: Rolling walker Gait Pattern: Step-through pattern;Decreased stride length;Trunk flexed Stairs: No Wheelchair Mobility Wheelchair Mobility: No  Posture/Postural Control Posture/Postural Control: No significant limitations Exercise    End of Session PT - End of Session Equipment Utilized During Treatment: Gait belt Activity Tolerance: Patient tolerated treatment well;Patient limited by fatigue;Patient limited by pain Patient left: in chair;with call bell in reach Nurse Communication: Mobility status for transfers General Behavior During Session: [facility] for tasks performed Cognition: [facility] for tasks performed  Catarina Hartshorn, [place] 10/28/2011, 12:44 PM

## 2011-10-28 NOTE — Progress Notes (Signed)
Physical Therapy Treatment Patient Details Name: Dean [name] MRN: [id number] DOB: 1924/05/28 Today's Date: 10/28/2011  Per Critical Care note today, to ambulate pt once ok with CVTS.  Please clarify if pt ok for ambulation.  Thanks.    Catarina Hartshorn, [place] 10/28/2011, 12:45 PM

## 2011-10-29 ENCOUNTER — Inpatient Hospital Stay (HOSPITAL_COMMUNITY): Payer: Medicare Other

## 2011-10-29 LAB — GLUCOSE, CAPILLARY
Glucose-Capillary: 145 mg/dL — ABNORMAL HIGH (ref 70–99)
Glucose-Capillary: 147 mg/dL — ABNORMAL HIGH (ref 70–99)
Glucose-Capillary: 120 mg/dL — ABNORMAL HIGH (ref 70–99)
Glucose-Capillary: 145 mg/dL — ABNORMAL HIGH (ref 70–99)

## 2011-10-29 LAB — BASIC METABOLIC PANEL
BUN: 11 mg/dL (ref 6–23)
CO2: 33 mEq/L — ABNORMAL HIGH (ref 19–32)
Calcium: 8.1 mg/dL — ABNORMAL LOW (ref 8.4–10.5)
Creatinine, Ser: 0.95 mg/dL (ref 0.50–1.35)
Glucose, Bld: 86 mg/dL (ref 70–99)

## 2011-10-29 LAB — PROTIME-INR: INR: 2.1 — ABNORMAL HIGH (ref 0.00–1.49)

## 2011-10-29 MED ORDER — WARFARIN SODIUM 3 MG PO TABS
3.0000 mg | ORAL_TABLET | Freq: Once | ORAL | Status: AC
  Administered 2011-10-29: 3 mg via ORAL
  Filled 2011-10-29: qty 1

## 2011-10-29 MED ORDER — POTASSIUM CHLORIDE CRYS ER 20 MEQ PO TBCR
40.0000 meq | EXTENDED_RELEASE_TABLET | Freq: Once | ORAL | Status: AC
  Administered 2011-10-29: 40 meq via ORAL
  Filled 2011-10-29: qty 2

## 2011-10-29 MED ORDER — TAMSULOSIN HCL 0.4 MG PO CAPS
0.4000 mg | ORAL_CAPSULE | Freq: Every day | ORAL | Status: DC
  Administered 2011-10-30 – 2011-11-03 (×5): 0.4 mg via ORAL
  Filled 2011-10-29 (×6): qty 1

## 2011-10-29 MED ORDER — LINEZOLID 2 MG/ML IV SOLN
600.0000 mg | Freq: Two times a day (BID) | INTRAVENOUS | Status: DC
  Administered 2011-10-29 – 2011-11-03 (×10): 600 mg via INTRAVENOUS
  Filled 2011-10-29 (×12): qty 300

## 2011-10-29 MED ORDER — FLEET ENEMA 7-19 GM/118ML RE ENEM
1.0000 | ENEMA | Freq: Once | RECTAL | Status: AC
  Administered 2011-10-29: 1 via RECTAL
  Filled 2011-10-29: qty 1

## 2011-10-29 NOTE — Progress Notes (Signed)
   CARE MANAGEMENT NOTE 10/29/2011  Patient:  Dean [name], Dean [name]   Account Number:  [id number]  Date Initiated:  10/17/2011  Documentation initiated by:  [facility]  Subjective/Objective Assessment:   Admitted with pneumonia and Afib with RVR from [place]. Has spouse     Action/Plan:   PTA, PT FAIRLY INDEPENDENT, LIVES WITH SPOUSE.  HAS SUPPORTIVE DAUGHTER.   Anticipated DC Date:  10/31/2011   Anticipated DC Plan:  IP REHAB FACILITY      DC Planning Services  CM consult      Choice offered to / List presented to:             Status of service:  In process, will continue to follow Medicare Important Message given?   (If response is "NO", the following Medicare IM given date fields will be blank) Date Medicare IM given:   Date Additional Medicare IM given:    Discharge Disposition:    Per UR Regulation:  Reviewed for med. necessity/level of care/duration of stay  Comments:  10/29/11 Niyam Bisping,RN,BSN 1400 PT HAS BEEN EVALUATED BY REHAB PHYSICIAN AND IS APPROPRIATE FOR INPT REHAB STAY WHEN MEDICALLY STABLE.  HE HAS SUPPORTIVE FAMILY TO ASSIST AT DISCHARGE.  HOPEFUL FOR DC TO [facility] BY END OF WEEK, IF MEDICALLY ACCEPTABLE WITH MD. Lionel December, RN, BSN Phone [phone]  10-29-11 3:50pm [name] [phone] UR Completed.  10-24-11 [place] UR Completed. post op VATSon 10-23-11  10-21-11 3pm [name], RNBSN [phone] UR Completed.  10-17-11 [name] [id number] UR Completed.

## 2011-10-29 NOTE — Progress Notes (Signed)
Name: NAI MATEL MRN: [id number] DOB: [birth year]    LOS: 40  [facility] PCCM Progress Note  Brief patient profile:  87 yowm to [place] hospital on 1/2. Dx eval demonstrated MRSA PNA.Had progressive pleural effusion with thoracentesis on 1/8 showing exudative  > to [facility] on 1/10 for further evaluation of  worsening of Right sided airspace disease, increased work of breathing, AF w/ RVR and progressive leukocytosis.   Lines / Drains: Rt chest tube 1/14 >>>VATS,drainage 1/17>>> Rt IJ 1/16>>> ETT 1/17> 1/18   Micro/Sepsis c-diff pcr 1/10>>neg  MRSA pcr>> +ve Sputum 1/7>>>MRSA  Influenza PCR1/2>>> Negative BAL 1/13>>>MRSA R Pleural 1/17 >  MRSA   Antibiotics: Vanco 1/5>>>1/13 Zosyn 1/10>>>1/13 Levaquin 1/2>>>1/10 Rocephin 1/2>>>1/10  primaxin 1/13>>>1/16 zyvox (MRSA PNA/ empyema)1/13>>> plan 3 weeks   Tests / Events: 1/10 CT Chest: increase in right pleural effusion, mucous plugging 1/10 CT abd: no evidence of bowel obstruction or ileus 1/8: thoracentesis on 1/8 yielding a exudative sample consistent with a parapneumonia effusion.   ECHO 10/17/2010: EF 55%, preserved systolic function. Mild aortic stenosis.  1/10- no distress 1/13 - resp distress, hypoxia, complete right lung collapse. S/p Bronchoscopy (DF)- obstructed rt main with blood clot mixed with pus.  1/14- prior exudate knonw, increased effusion, chest tube placed, hydro pneurmo improved 1/14 - CT chest - hydro PNA, PNA 1/14- amio strted 1/15- improved Rt lung aeration post Chest tube placement 1/16- cardizem drip stopped, breathing unchanged, gross tolerated neg balance, no distress 1/17- s/p VATS, empyema drainage, rt lung decortication.  vent post op 1/23- chest tubes removed  Subjective/Overnight event:   on low O2, neg balance   Vital Signs: Temp:  [97.2 F (36.2 C)-100 F (37.8 C)] 97.8 F (36.6 C) (01/23 0800) Pulse Rate:  [79-97] 88  (01/23 1000) Resp:  [14-22] 14  (01/23 1000) BP: (97-113)/(50-66)  113/61 mmHg (01/23 1000) SpO2:  [91 %-97 %] 97 % (01/23 1000) Weight:  [85.2 kg (187 lb 13.3 oz)] 85.2 kg (187 lb 13.3 oz) (01/23 0045)    Physical Examination: General:  Elderly male,no distress Neuro: alert/ oriented HEENT:  Supple,no , Rt IJ clean Cardiovascular: S1 S2 , RRR Respic: reduced right, no wheezing, rt chest tube drains x 2, ronchi less Abdomen:  Non tender to palpation.. BS diminished, still distended Musculoskeletal: no edema   Labs   Lab 10/29/11 0520 10/28/11 0540 10/26/11 0105  NA 136 136 132*  K 3.8 4.1 3.2*  CL 98 98 97  CO2 33* 31 30  BUN 11 10 11   CREATININE 0.95 1.04 0.83  GLUCOSE 86 116* 131*    Lab 10/28/11 0540 10/25/11 0445 10/24/11 0500  HGB 11.1* 10.9* 11.3*  HCT 34.2* 32.5* 34.8*  WBC 7.0 10.8* 13.0*  PLT 374 317 367        Dg Abd Acute W/chest  10/26/2011   IMPRESSION: Right apical and basilar chest tubes.  No pneumothorax is seen. Rt base atx / infiltrate   Assessment and Plan:  MRSA PNA,  Empyema,  Hypoxic resp failure, on vent post op, was on 100% NRB prior to VATS. s/p VATS 1/17, Rt lung decortication and empyema drainage, currently on 2 lit  PLAN -continue pulmonary toilet - titrate fio2 for pulse ox > 92% - chest tubes removed pcxr reviewed -IS Continue lasix same dose pcxr in am  ambulate  Abdominal distention/Ileus. In the setting of recent hospital stay and quinolone therapy Assessment: loose stool, distended belly, diminished bowel sounds, bloody drainage in OG on 1/18. AXR -  no ileus 1/18 Tolerating clears  ileus Plan -ambulate when ok by cvts ppi BM reported x 3 Limit narcs Dulcolax supp low threhsold  A-fib with RVR  Assessment : NSR, rate normal Plan: -on amio drip since 1/14 (startd by PCCM due to tachycardia despite cardizem and soft BP)    Off 1/19     Remains in SR and controlled on oral home cardizem  PLAN  home cardizem 180mg  CD24h  -SQ heparin dvt proph since 1/18  - po coumadin intiated  1/18--> pharmacy note reviewed  Sepsis- primarily PNA/Parapneumonic effusion, possible colitis, r/o empyema. BAL MRSA Assessment: see problem 1 Plan -linazolid, stop date added ct out, pcxr in am   Urinary retention Plan: foley  Dc today, follow output  ileus -PPI -clears, advance Ambulate today Avoid narcs abdo soft , did have BM x 2 May need repeat enema kub follow up   HYPOKALEMIA  Lab 10/29/11 0520 10/28/11 0540 10/26/11 0105 10/25/11 0445 10/24/11 1138  K 3.8 4.1 3.2* 3.1* 3.5  PLAN wnl Lasix to daily Small k supp with active diuresis   Best practices / Disposition: -->  SDU under PCCM; d -->full code --> SCD  -->Protonix for GI Px -->full  [name], MD, [place] Pgr: [facility] [facility]

## 2011-10-29 NOTE — Progress Notes (Signed)
Physical Therapy Treatment Patient Details Name: Dean [name] MRN: [id number] DOB: [birth year] Today's Date: 10/29/2011  PT Assessment/Plan  PT - Assessment/Plan Comments on Treatment Session: pt presents s/p Vats with PNA.  pt noting increased pain since R chest tube removed and increased fatigue today as Nsg already had pt to 3-in-1 and to chair less than hour prior to PT.   PT Plan: Discharge plan remains appropriate;Frequency remains appropriate PT Frequency: Min 3X/week Recommendations for Other Services: Rehab consult;OT consult Follow Up Recommendations: Inpatient Rehab Equipment Recommended: Defer to next venue PT Goals  Acute Rehab PT Goals PT Goal: Supine/Side to Sit - Progress: Not met PT Goal: Sit to Supine/Side - Progress: Not met PT Goal: Sit to Stand - Progress: Progressing toward goal PT Goal: Ambulate - Progress: Not met PT Goal: Up/Down Stairs - Progress: Not met  PT Treatment Precautions/Restrictions  Precautions Precautions: Fall ([place] Contact) Required Braces or Orthoses: No Restrictions Weight Bearing Restrictions: No Mobility (including Balance) Bed Mobility Bed Mobility: No Transfers Transfers: Yes Sit to Stand: 1: +2 Total assist;Patient percentage (comment);With upper extremity assist;From chair/3-in-1 (pt 60%) Sit to Stand Details (indicate cue type and reason): pt nedds cues and A for anterior wt shift Stand to Sit: 1: +2 Total assist;Patient percentage (comment);With upper extremity assist;To chair/3-in-1 (pt 60%) Stand to Sit Details: pt needed increased A to control descent Ambulation/Gait Ambulation/Gait: No (pt notes increased pain and fatigue today) Stairs: No Wheelchair Mobility Wheelchair Mobility: No    Exercise    End of Session PT - End of Session Equipment Utilized During Treatment: Gait belt Activity Tolerance: Patient limited by pain;Patient limited by fatigue Patient left: in chair;with call bell in reach Nurse  Communication: Mobility status for transfers General Behavior During Session: [facility] for tasks performed Cognition: [facility] for tasks performed  Catarina Hartshorn, [place] 10/29/2011, 12:31 PM

## 2011-10-29 NOTE — Progress Notes (Signed)
Met with patient at bedside. He is appropriate to admit to inpatient rehab/CIR. Patient is in agreement. He has supports at home and would like CIR. Please clarify when he is medically ready and we will plan admit, by Friday?. Please call me with questions. Pager [phone]

## 2011-10-29 NOTE — Progress Notes (Signed)
[facility] Progress Note Patient Name: Dean [name] DOB: [birth year] MRN: [id number]  Date of Service  10/29/2011   HPI/Events of Note  RN calling elink. Couday removed. -> patient symptomatic   eICU Interventions  flomax reordered   Intervention Category Major Interventions: Other:  [name] 10/29/2011, 6:44 PM

## 2011-10-29 NOTE — Progress Notes (Addendum)
ANTICOAGULATION CONSULT NOTE - Follow Up Consult  Pharmacy Consult for coumadin Indication: h/o afib  No Known Allergies  Patient Measurements: Height: 6' (182.9 cm) Weight: 187 lb 13.3 oz (85.2 kg) IBW/kg (Calculated) : 77.6  Adjusted Body Weight:  Vital Signs: Temp: 97.8 F (36.6 C) (01/23 0800) Temp src: Oral (01/23 0800) BP: 113/61 mmHg (01/23 1000) Pulse Rate: 88  (01/23 1000)  Labs:  Basename 10/29/11 0520 10/28/11 0540 10/27/11 0455  HGB -- 11.1* --  HCT -- 34.2* --  PLT -- 374 --  APTT -- -- --  LABPROT 23.9* 24.3* 27.1*  INR 2.10* 2.14* 2.46*  HEPARINUNFRC -- -- --  CREATININE 0.95 1.04 --  CKTOTAL -- -- --  CKMB -- -- --  TROPONINI -- -- --   Estimated Creatinine Clearance: 62.4 ml/min (by C-G formula based on Cr of 0.95).   Medications:  Scheduled:     . antiseptic oral rinse  15 mL Mouth Rinse q12n4p  . bisacodyl  10 mg Oral Daily  . chlorhexidine  15 mL Mouth Rinse BID  . diltiazem  180 mg Oral Daily  . furosemide  20 mg Intravenous Daily  . insulin aspart  0-9 Units Subcutaneous Q4H  . levalbuterol  0.63 mg Nebulization Q6H  . linezolid  600 mg Intravenous Q12H  . pantoprazole  40 mg Oral QHS  . potassium chloride  40 mEq Oral Once  . sodium phosphate  1 enema Rectal Once  . sodium phosphate  1 enema Rectal Once  . warfarin  3 mg Oral ONCE-1800  . DISCONTD: linezolid  600 mg Intravenous Q12H    Assessment: 87 YOM tx from [facility] with MRSA pna and pleural effusion,  S/p VATS.  INR = 2.10.  No bleeding noted.  Appears needs reduced compared with home dose, but diet also just recently advanced.   PTA regimen was 5mg  on Tues, Fri. And 4mg  on Mon, Wed, Thurs, Sat, Sun.   Goal of Therapy:  INR 2-3   Plan:  1. Coumadin 3mg  PO tonight 2. Daily INR 3. Linezolid 600mg  IV BID is appropriate, consider PO if bowel fx has returned.  Also stop date of 2/3 added today and MD notes have stated 14days tx which would = 1/31 (from 1/17 tissue  cx growing MRSA).   Clovis Riley 10/29/2011,11:03 AM

## 2011-10-29 NOTE — Progress Notes (Signed)
6 Days Post-Op Procedure(s) (LRB): VIDEO ASSISTED THORACOSCOPY (VATS)/THOROCOTOMY (Right)  Subjective: Feeling better.   Objective: Vital signs in last 24 hours: Temp:  [97.2 F (36.2 C)-100 F (37.8 C)] 98.2 F (36.8 C) (01/23 1200) Pulse Rate:  [79-97] 84  (01/23 1200) Cardiac Rhythm:  [-] Normal sinus rhythm;Bundle branch block (01/23 1000) Resp:  [14-22] 18  (01/23 1200) BP: (97-113)/(50-66) 103/62 mmHg (01/23 1200) SpO2:  [91 %-97 %] 97 % (01/23 1200) Weight:  [85.2 kg (187 lb 13.3 oz)] 85.2 kg (187 lb 13.3 oz) (01/23 0045)  Hemodynamic parameters for last 24 hours:    Intake/Output from previous day: 01/22 0701 - 01/23 0700 In: 1060 [I.V.:460; IV Piggyback:600] Out: 1702 [Urine:1700; Stool:2] Intake/Output this shift: Total I/O In: 60 [P.O.:20; I.V.:40] Out: 800 [Urine:600; Stool:200]  General appearance: alert and cooperative Heart: regular rate and rhythm, S1, S2 normal, no murmur, click, rub or gallop Lungs: diminished breath sounds bibasilar Abdomen: abnormal findings:  Distended, bowel sounds present. Nontender Wound: chest incision ok  Lab Results:  Basename 10/28/11 0540  WBC 7.0  HGB 11.1*  HCT 34.2*  PLT 374   BMET:  Basename 10/29/11 0520 10/28/11 0540  NA 136 136  K 3.8 4.1  CL 98 98  CO2 33* 31  GLUCOSE 86 116*  BUN 11 10  CREATININE 0.95 1.04  CALCIUM 8.1* 8.0*    PT/INR:  Basename 10/29/11 0520  LABPROT 23.9*  INR 2.10*   ABG    Component Value Date/Time   PHART 7.548* 10/24/2011 0534   HCO3 36.2* 10/24/2011 0534   TCO2 37 10/24/2011 0534   ACIDBASEDEF 1.0 10/19/2011 1611   O2SAT 93.0 10/24/2011 0534   CBG (last 3)   Basename 10/29/11 1130 10/29/11 0810 10/29/11 0403  GLUCAP 147* 97 96    *RADIOLOGY REPORT*  Clinical Data: Cough. Congestion.  PORTABLE CHEST - 1 VIEW  Comparison: 10/28/2011  Findings: Right IJ central line is unchanged. Low lung volumes.  Basilar airspace disease and atelectasis remains present. Question    small left pleural effusion. Low volumes accentuate cardiomegaly.  Monitoring leads are projected over the chest. Staples are present  in the right lateral chest.  IMPRESSION:  Stable support apparatus without interval change in pulmonary  aeration.  Original Report Authenticated By: Dereck Ligas, M.D.   Assessment/Plan: S/P Procedure(s) (LRB): VIDEO ASSISTED THORACOSCOPY (VATS)/THOROCOTOMY (Right) He is doing well from a pulmonary standpoint and needs to continue working on pulmonary toilet. Will keep chest staples in for a few more days but they can come out before he goes home or to SNF.   LOS: 13 days    BARTLE,BRYAN K 10/29/2011

## 2011-10-29 NOTE — Progress Notes (Signed)
ok 

## 2011-10-29 NOTE — Progress Notes (Signed)
Patient discussed at the Long Length of Stay Dean [name] [facility] 10/29/2011

## 2011-10-30 ENCOUNTER — Inpatient Hospital Stay (HOSPITAL_COMMUNITY): Payer: Medicare Other

## 2011-10-30 DIAGNOSIS — J13 Pneumonia due to Streptococcus pneumoniae: Secondary | ICD-10-CM

## 2011-10-30 DIAGNOSIS — J9819 Other pulmonary collapse: Secondary | ICD-10-CM

## 2011-10-30 DIAGNOSIS — J90 Pleural effusion, not elsewhere classified: Secondary | ICD-10-CM

## 2011-10-30 DIAGNOSIS — J15212 Pneumonia due to Methicillin resistant Staphylococcus aureus: Secondary | ICD-10-CM

## 2011-10-30 LAB — DIFFERENTIAL
Basophils Relative: 0 % (ref 0–1)
Eosinophils Absolute: 0.2 10*3/uL (ref 0.0–0.7)
Lymphs Abs: 1.2 10*3/uL (ref 0.7–4.0)
Monocytes Relative: 8 % (ref 3–12)
Neutro Abs: 7.3 10*3/uL (ref 1.7–7.7)
Neutrophils Relative %: 77 % (ref 43–77)

## 2011-10-30 LAB — GLUCOSE, CAPILLARY
Glucose-Capillary: 111 mg/dL — ABNORMAL HIGH (ref 70–99)
Glucose-Capillary: 144 mg/dL — ABNORMAL HIGH (ref 70–99)

## 2011-10-30 LAB — CBC
Hemoglobin: 10.9 g/dL — ABNORMAL LOW (ref 13.0–17.0)
MCHC: 32.8 g/dL (ref 30.0–36.0)
Platelets: 406 10*3/uL — ABNORMAL HIGH (ref 150–400)
RBC: 3.41 MIL/uL — ABNORMAL LOW (ref 4.22–5.81)

## 2011-10-30 LAB — BASIC METABOLIC PANEL
Calcium: 8.3 mg/dL — ABNORMAL LOW (ref 8.4–10.5)
GFR calc Af Amer: 85 mL/min — ABNORMAL LOW (ref >90)
GFR calc non Af Amer: 73 mL/min — ABNORMAL LOW (ref >90)
Potassium: 3.5 mEq/L (ref 3.5–5.1)
Sodium: 133 mEq/L — ABNORMAL LOW (ref 135–145)

## 2011-10-30 LAB — PROTIME-INR: Prothrombin Time: 23.8 seconds — ABNORMAL HIGH (ref 11.6–15.2)

## 2011-10-30 MED ORDER — WARFARIN SODIUM 3 MG PO TABS
3.0000 mg | ORAL_TABLET | Freq: Once | ORAL | Status: DC
  Filled 2011-10-30: qty 1

## 2011-10-30 MED ORDER — HEPARIN SODIUM (PORCINE) 5000 UNIT/ML IJ SOLN
5000.0000 [IU] | Freq: Three times a day (TID) | INTRAMUSCULAR | Status: DC
  Administered 2011-10-30 – 2011-11-03 (×13): 5000 [IU] via SUBCUTANEOUS
  Filled 2011-10-30 (×16): qty 1

## 2011-10-30 MED ORDER — POTASSIUM CHLORIDE CRYS ER 20 MEQ PO TBCR
40.0000 meq | EXTENDED_RELEASE_TABLET | Freq: Once | ORAL | Status: AC
  Administered 2011-10-30: 40 meq via ORAL
  Filled 2011-10-30: qty 2

## 2011-10-30 NOTE — Progress Notes (Signed)
ANTICOAGULATION CONSULT NOTE - Follow Up Consult  Pharmacy Consult for coumadin Indication: h/o afib  No Known Allergies  Patient Measurements: Height: 6' (182.9 cm) Weight: 188 lb 7.9 oz (85.5 kg) IBW/kg (Calculated) : 77.6  Adjusted Body Weight:  Vital Signs: Temp: 98.2 F (36.8 C) (01/24 0802) Temp src: Oral (01/24 0802) BP: 104/60 mmHg (01/24 1100) Pulse Rate: 95  (01/24 1100)  Labs:  Basename 10/30/11 0410 10/29/11 0520 10/28/11 0540  HGB 10.9* -- 11.1*  HCT 33.2* -- 34.2*  PLT 406* -- 374  APTT -- -- --  LABPROT 23.8* 23.9* 24.3*  INR 2.09* 2.10* 2.14*  HEPARINUNFRC -- -- --  CREATININE 0.96 0.95 1.04  CKTOTAL -- -- --  CKMB -- -- --  TROPONINI -- -- --   Estimated Creatinine Clearance: 61.7 ml/min (by C-G formula based on Cr of 0.96).   Medications:  Scheduled:     . antiseptic oral rinse  15 mL Mouth Rinse q12n4p  . bisacodyl  10 mg Oral Daily  . chlorhexidine  15 mL Mouth Rinse BID  . diltiazem  180 mg Oral Daily  . furosemide  20 mg Intravenous Daily  . insulin aspart  0-9 Units Subcutaneous Q4H  . levalbuterol  0.63 mg Nebulization Q6H  . linezolid  600 mg Intravenous Q12H  . pantoprazole  40 mg Oral QHS  . potassium chloride  40 mEq Oral Once  . sodium phosphate  1 enema Rectal Once  . Tamsulosin HCl  0.4 mg Oral Daily  . warfarin  3 mg Oral ONCE-1800    Assessment: 87 YOM tx from [facility] with MRSA pna and pleural effusion,  S/p VATS.  INR = 2.09.  No bleeding noted.  Appears needs reduced compared with home dose, but diet also just recently advanced.   PTA regimen was 5mg  on Tues, Fri. And 4mg  on Mon, Wed, Thurs, Sat, Sun.   Goal of Therapy:  INR 2-3   Plan:  1. Coumadin 3mg  PO tonight 2. Daily INR 3. Linezolid 600mg  IV BID is appropriate, consider PO if bowel fx has returned.  Also stop date of 2/3 added and MD notes have stated 14days tx which would = 1/31 (from 1/17 tissue cx growing MRSA).   Clovis Riley 10/30/2011,11:43 AM

## 2011-10-30 NOTE — Progress Notes (Signed)
Name: Dean [name] MRN: [id number] DOB: 21-Feb-1924    LOS: 44  [place] PCCM Progress Note  Brief patient profile:  87 yowm to [place] [facility] on 1/2. Dx eval demonstrated MRSA PNA.Had progressive pleural effusion with thoracentesis on 1/8 showing exudative  > to [facility] on 1/10 for further evaluation of  worsening of Right sided airspace disease, increased work of breathing, AF w/ RVR and progressive leukocytosis.   Lines / Drains: Rt chest tube 1/14 >>>VATS,drainage 1/17>>> Rt IJ 1/16>>>1/23 ETT 1/17> 1/18   Micro/Sepsis c-diff pcr 1/10>>neg  MRSA pcr>> +ve Sputum 1/7>>>MRSA  Influenza PCR1/2>>> Negative BAL 1/13>>>MRSA R Pleural 1/17 >  MRSA   Antibiotics: Vanco 1/5>>>1/13 Zosyn 1/10>>>1/13 Levaquin 1/2>>>1/10 Rocephin 1/2>>>1/10  primaxin 1/13>>>1/16 zyvox (MRSA PNA/ empyema)1/13>>> plan 3 weeks   Tests / Events: 1/10 CT Chest: increase in right pleural effusion, mucous plugging 1/10 CT abd: no evidence of bowel obstruction or ileus 1/8: thoracentesis on 1/8 yielding a exudative sample consistent with a parapneumonia effusion.   ECHO 10/17/2010: EF 55%, preserved systolic function. Mild aortic stenosis.  1/10- no distress 1/13 - resp distress, hypoxia, complete right lung collapse. S/p Bronchoscopy (DF)- obstructed rt main with blood clot mixed with pus.  1/14- prior exudate knonw, increased effusion, chest tube placed, hydro pneurmo improved 1/14 - CT chest - hydro PNA, PNA 1/14- amio strted 1/15- improved Rt lung aeration post Chest tube placement 1/16- cardizem drip stopped, breathing unchanged, gross tolerated neg balance, no distress 1/17- s/p VATS, empyema drainage, rt lung decortication.  vent post op 1/23- chest tubes removed 1/24- Bm noted, no distress, rt base atx  Subjective/Overnight event:   on low O2, neg balance   Vital Signs: Temp:  [98 F (36.7 C)-98.9 F (37.2 C)] 98.2 F (36.8 C) (01/24 0802) Pulse Rate:  [64-107] 95  (01/24 1100) Resp:   [14-19] 17  (01/24 0802) BP: (99-140)/(54-67) 104/60 mmHg (01/24 1100) SpO2:  [91 %-97 %] 95 % (01/24 1100) Weight:  [85.5 kg (188 lb 7.9 oz)] 85.5 kg (188 lb 7.9 oz) (01/24 0450)    Physical Examination: General:  Elderly male,no distress Neuro: alert/ oriented HEENT:  Supple,no , Rt IJ clean Cardiovascular: S1 S2 , RRR Respic: reduced rt base Abdomen:  Non tender to palpation.. BS diminished, still distended Musculoskeletal: no edema   Labs   Lab 10/30/11 0410 10/29/11 0520 10/28/11 0540  NA 133* 136 136  K 3.5 3.8 4.1  CL 96 98 98  CO2 31 33* 31  BUN 13 11 10   CREATININE 0.96 0.95 1.04  GLUCOSE 119* 86 116*    Lab 10/30/11 0410 10/28/11 0540 10/25/11 0445  HGB 10.9* 11.1* 10.9*  HCT 33.2* 34.2* 32.5*  WBC 9.5 7.0 10.8*  PLT 406* 374 317        Dg Abd Acute W/chest  10/26/2011   IMPRESSION: Right apical and basilar chest tubes.  No pneumothorax is seen. Rt base atx / infiltrate   Assessment and Plan:  MRSA PNA,  Empyema,  Hypoxic resp failure, on vent post op, was on 100% NRB prior to VATS. s/p VATS 1/17, Rt lung decortication and empyema drainage, currently on 2 lit New rt base atx again  PLAN -continue pulmonary toilet - titrate fio2 for pulse ox > 92% - chest tubes removed 1/23 pcxr reviewed, repeat rt base in am  -IS important Add chest pt rt base Continue lasix same dose pcxr in am  Ambulate if able treating ileus May need mucomysts if avaialble  in  past did have rt main collapse from blood, dc coumadin  Abdominal distention/Ileus. In the setting of recent hospital stay and quinolone therapy Assessment: loose stool, distended belly, diminished bowel sounds, bloody drainage in OG on 1/18. AXR - no ileus 1/18 Tolerating clears  ileus Plan -ambulate Improved daily Having bm with regimen Diet ok  ppi  A-fib with RVR  Assessment : NSR, rate normal Plan: -on amio drip since 1/14 (startd by PCCM due to tachycardia despite cardizem and soft BP)     Off 1/19     Remains in SR and controlled on oral home cardizem  PLAN  home cardizem 180mg  CD24h  -SQ heparin dvt proph since 1/18, dc coumadin with concerns rt main see pulm  - po coumadin intiated 1/18--> pharmacy note reviewed  Sepsis- primarily PNA/Parapneumonic effusion, possible colitis, r/o empyema. BAL MRSA Assessment: see problem 1 Plan -linazolid, stop date added ct out, pcxr in am  Follow r base closely, remai in sdu  Urinary retention Plan: foley  Dc today, follow output  ileus -PPI -on diet Improved daily with regimen   HYPOKALEMIA  Lab 10/30/11 0410 10/29/11 0520 10/28/11 0540 10/26/11 0105 10/25/11 0445  K 3.5 3.8 4.1 3.2* 3.1*  PLAN wnl Lasix to daily Small k supp 40    Best practices / Disposition: -->  SDU under PCCM; d -->full code --> SCD  -->Protonix for GI Px -->full  [name], MD, [place] Pgr: [facility]

## 2011-10-31 ENCOUNTER — Inpatient Hospital Stay (HOSPITAL_COMMUNITY): Payer: Medicare Other

## 2011-10-31 LAB — CBC
MCH: 31 pg (ref 26.0–34.0)
MCHC: 32 g/dL (ref 30.0–36.0)
MCV: 96.9 fL (ref 78.0–100.0)
Platelets: 415 10*3/uL — ABNORMAL HIGH (ref 150–400)
RBC: 3.26 MIL/uL — ABNORMAL LOW (ref 4.22–5.81)

## 2011-10-31 LAB — GLUCOSE, CAPILLARY
Glucose-Capillary: 154 mg/dL — ABNORMAL HIGH (ref 70–99)
Glucose-Capillary: 118 mg/dL — ABNORMAL HIGH (ref 70–99)

## 2011-10-31 LAB — BASIC METABOLIC PANEL
BUN: 14 mg/dL (ref 6–23)
CO2: 32 mEq/L (ref 19–32)
Calcium: 7.9 mg/dL — ABNORMAL LOW (ref 8.4–10.5)
Creatinine, Ser: 0.97 mg/dL (ref 0.50–1.35)
Glucose, Bld: 106 mg/dL — ABNORMAL HIGH (ref 70–99)

## 2011-10-31 LAB — DIFFERENTIAL
Eosinophils Absolute: 0.3 10*3/uL (ref 0.0–0.7)
Eosinophils Relative: 4 % (ref 0–5)
Lymphs Abs: 1.4 10*3/uL (ref 0.7–4.0)

## 2011-10-31 MED ORDER — POTASSIUM CHLORIDE CRYS ER 20 MEQ PO TBCR
40.0000 meq | EXTENDED_RELEASE_TABLET | Freq: Once | ORAL | Status: AC
  Administered 2011-10-31: 40 meq via ORAL
  Filled 2011-10-31: qty 2

## 2011-10-31 NOTE — Progress Notes (Signed)
Physical Therapy Treatment Patient Details Name: RIJUL AMMAR MRN: [id number] DOB: [birth year] Today's Date: 10/31/2011  PT Assessment/Plan  PT - Assessment/Plan Comments on Treatment Session: pt presents s/p VATs with MRSA PNA.  PT limited today as pt requested to be left on 3-in-1 for "at least another 18mins" after already more than 39mins on 3-in-1.  RN made aware.   PT Plan: Discharge plan remains appropriate;Frequency remains appropriate PT Frequency: Min 3X/week Recommendations for Other Services: Rehab consult;OT consult Follow Up Recommendations: Inpatient Rehab Equipment Recommended: Defer to next venue PT Goals  Acute Rehab PT Goals PT Goal: Supine/Side to Sit - Progress: Progressing toward goal PT Goal: Sit to Supine/Side - Progress: Not met PT Goal: Sit to Stand - Progress: Progressing toward goal PT Goal: Ambulate - Progress: Not met PT Goal: Up/Down Stairs - Progress: Not met  PT Treatment Precautions/Restrictions  Precautions Precautions: Fall (Orange Contact) Required Braces or Orthoses: No Restrictions Weight Bearing Restrictions: No Mobility (including Balance) Bed Mobility Bed Mobility: Yes Rolling Right: 3: Mod assist;With rail Rolling Right Details (indicate cue type and reason): cues for encouragement and sequencing Right Sidelying to Sit: 3: Mod assist;HOB elevated (comment degrees) (HOB ~30degrees) Right Sidelying to Sit Details (indicate cue type and reason): cues for sequencing and encouragement Sitting - Scoot to Edge of Bed: 2: Max assist Transfers Transfers: Yes Stand Pivot Transfers: 1: +2 Total assist;Patient percentage (comment) (pt 60%) Stand Pivot Transfer Details (indicate cue type and reason): cues for use of UEs and use of RW Ambulation/Gait Ambulation/Gait: No Stairs: No Wheelchair Mobility Wheelchair Mobility: No    Exercise    End of Session PT - End of Session Equipment Utilized During Treatment: Gait belt Activity Tolerance:   (Limited by pt wanting to sit on 3-in-1 "for at least 57mins") Patient left:  (on 3-in-1 with call bell and RN aware) Nurse Communication: Mobility status for transfers (pt on 3-in-1) General Behavior During Session: [facility] for tasks performed Cognition: [facility] for tasks performed  Catarina Hartshorn, [place] 10/31/2011, 9:28 AM

## 2011-10-31 NOTE — Progress Notes (Signed)
8 Days Post-Op Procedure(s) (LRB): VIDEO ASSISTED THORACOSCOPY (VATS)/THOROCOTOMY (Right) Subjective: Right chest wall still sore but tolerable  Objective: Vital signs in last 24 hours: Temp:  [97 F (36.1 C)-98.9 F (37.2 C)] 97.8 F (36.6 C) (01/25 1600) Pulse Rate:  [84-104] 88  (01/25 1600) Cardiac Rhythm:  [-] Normal sinus rhythm (01/25 1600) Resp:  [14-22] 14  (01/25 1600) BP: (89-117)/(57-80) 109/61 mmHg (01/25 1600) SpO2:  [93 %-99 %] 99 % (01/25 1600) Weight:  [84.3 kg (185 lb 13.6 oz)] 84.3 kg (185 lb 13.6 oz) (01/25 0032)  Hemodynamic parameters for last 24 hours:    Intake/Output from previous day: 01/24 0701 - 01/25 0700 In: 1445 [P.O.:840; IV Piggyback:605] Out: [id number] [Urine:1100; Stool:3] Intake/Output this shift: Total I/O In: 780 [P.O.:480; IV Piggyback:300] Out: 1025 [Urine:1025]  General appearance: alert and cooperative Heart: regular rate and rhythm, S1, S2 normal, no murmur, click, rub or gallop Lungs: clear to auscultation bilaterally Abdomen:  bowel sounds normal and soft, non-tender, distended Wound: right chest incision ok. Minimal serous drainage from chest tube sites  Lab Results:  [facility] 10/31/11 0425 10/30/11 0410  WBC 7.3 9.5  HGB 10.1* 10.9*  HCT 31.6* 33.2*  PLT 415* 406*   BMET:  Basename 10/31/11 0425 10/30/11 0410  NA 133* 133*  K 3.7 3.5  CL 96 96  CO2 32 31  GLUCOSE 106* 119*  BUN 14 13  CREATININE 0.97 0.96  CALCIUM 7.9* 8.3*    PT/INR:  Basename 10/31/11 0425  LABPROT 24.5*  INR 2.16*   ABG    Component Value Date/Time   PHART 7.548* 10/24/2011 0534   HCO3 36.2* 10/24/2011 0534   TCO2 37 10/24/2011 0534   ACIDBASEDEF 1.0 10/19/2011 1611   O2SAT 93.0 10/24/2011 0534   CBG (last 3)   Basename 10/31/11 1611 10/31/11 1137 10/31/11 0831  GLUCAP 123* 128* 121*    Assessment/Plan: S/P Procedure(s) (LRB): VIDEO ASSISTED THORACOSCOPY (VATS)/THOROCOTOMY (Right) Slowly improving.  No signs of active  infection. Will keep chest staples and chest tube sutures in for now since he is nutritionally depleted and probably healing slowly.   LOS: 15 days    BARTLE,BRYAN K 10/31/2011

## 2011-10-31 NOTE — Progress Notes (Signed)
Order to transfer pt.  Pt notified and wife Pamala Hurry called.  All belongings packed.  Pt to go to 2039.  Report called to [facility].  Pt to transfer in bed with all belongings.

## 2011-10-31 NOTE — Progress Notes (Signed)
Nutrition Follow-up  Diet Order:  Full Liquids  Meds: Scheduled Meds:   . antiseptic oral rinse  15 mL Mouth Rinse q12n4p  . bisacodyl  10 mg Oral Daily  . chlorhexidine  15 mL Mouth Rinse BID  . diltiazem  180 mg Oral Daily  . furosemide  20 mg Intravenous Daily  . heparin subcutaneous  5,000 Units Subcutaneous Q8H  . insulin aspart  0-9 Units Subcutaneous Q4H  . levalbuterol  0.63 mg Nebulization Q6H  . linezolid  600 mg Intravenous Q12H  . pantoprazole  40 mg Oral QHS  . potassium chloride  40 mEq Oral Once  . Tamsulosin HCl  0.4 mg Oral Daily  . DISCONTD: warfarin  3 mg Oral ONCE-1800   Continuous Infusions:   . sodium chloride 20 mL/hr at 10/28/11 0952   PRN Meds:.ondansetron (ZOFRAN) IV, oxyCODONE-acetaminophen, senna-docusate  Labs:  CMP     Component Value Date/Time   NA 133* 10/31/2011 0425   K 3.7 10/31/2011 0425   CL 96 10/31/2011 0425   CO2 32 10/31/2011 0425   GLUCOSE 106* 10/31/2011 0425   BUN 14 10/31/2011 0425   CREATININE 0.97 10/31/2011 0425   CALCIUM 7.9* 10/31/2011 0425   PROT 5.9* 10/25/2011 0445   ALBUMIN 1.5* 10/25/2011 0445   AST 23 10/25/2011 0445   ALT 18 10/25/2011 0445   ALKPHOS 60 10/25/2011 0445   BILITOT 0.5 10/25/2011 0445   GFRNONAA 73* 10/31/2011 0425   GFRAA 85* 10/31/2011 0425     Intake/Output Summary (Last 24 hours) at 10/31/11 1152 Last data filed at 10/31/11 1136  Gross per 24 hour  Intake   1320 ml  Output   1077 ml  Net    243 ml    Spoke with patient about diet tolerance. He is feeling better and was able to eat all of breakfast. He did mention a tightness in his chest and stomach, but said he was still able to keep all of his breakfast down. Patient appeared confident that his appetite would continue to improve, and didn't feel as if supplementation would be necessary.  Weight Status:  Pt is 84.3 kg. Weight fluctuated from 95 kg (01/13) to 83.4 kg (01/18).    Re-estimated needs:  Kcal: 1750-1950  Protein: 87-97 gm  Fluid: >  1.7 L  Nutrition Dx: Inadequate oral intake (NI-2.1). Status: Progressing  Goal:  Intake >75% of meals (when diet advanced) to better meet estimated needs, met  Intervention:  Initiate supplements if PO intake decreases to <50% once patient is placed on solid diet  Monitor: PO intake, weights, tolerance of solid foods   Atlee Abide Pager #:  [place], Higinio Roger [phone]

## 2011-10-31 NOTE — Progress Notes (Signed)
Name: Dean [name] MRN: [id number] DOB: June 26, 1924    LOS: 72  [place] PCCM Progress Note  Brief patient profile:  87 yowm to [place] [facility] on 1/2. Dx eval demonstrated MRSA PNA.Had progressive pleural effusion with thoracentesis on 1/8 showing exudative  > to [facility] on 1/10 for further evaluation of  worsening of Right sided airspace disease, increased work of breathing, AF w/ RVR and progressive leukocytosis.   Lines / Drains: Rt chest tube 1/14 >>>VATS,drainage 1/17>>>1/23 Rt IJ 1/16>>>1/23 ETT 1/17> 1/18   Micro/Sepsis c-diff pcr 1/10>>neg  MRSA pcr>> +ve Sputum 1/7>>>MRSA  Influenza PCR1/2>>> Negative BAL 1/13>>>MRSA R Pleural 1/17 >  MRSA   Antibiotics: Vanco 1/5>>>1/13 Zosyn 1/10>>>1/13 Levaquin 1/2>>>1/10 Rocephin 1/2>>>1/10  primaxin 1/13>>>1/16 zyvox (MRSA PNA/ empyema)1/13>>> plan 3 weeks   Tests / Events: 1/10 CT Chest: increase in right pleural effusion, mucous plugging 1/10 CT abd: no evidence of bowel obstruction or ileus 1/8: thoracentesis on 1/8 yielding a exudative sample consistent with a parapneumonia effusion.   ECHO 10/17/2010: EF 55%, preserved systolic function. Mild aortic stenosis.  1/10- no distress 1/13 - resp distress, hypoxia, complete right lung collapse. S/p Bronchoscopy (DF)- obstructed rt main with blood clot mixed with pus.  1/14- prior exudate knonw, increased effusion, chest tube placed, hydro pneurmo improved 1/14 - CT chest - hydro PNA, PNA 1/14- amio strted 1/15- improved Rt lung aeration post Chest tube placement 1/16- cardizem drip stopped, breathing unchanged, gross tolerated neg balance, no distress 1/17- s/p VATS, empyema drainage, rt lung decortication.  vent post op 1/23- chest tubes removed 1/24- Bm noted, no distress, rt base atx 1/25- no distress  Subjective/Overnight event:   on low O2, neg balance   Vital Signs: Temp:  [97 F (36.1 C)-98.9 F (37.2 C)] 97.9 F (36.6 C) (01/25 1136) Pulse Rate:  [84-104] 94   (01/25 1400) Resp:  [15-22] 17  (01/25 1400) BP: (89-117)/(57-80) 107/57 mmHg (01/25 1400) SpO2:  [93 %-98 %] 97 % (01/25 1400) Weight:  [84.3 kg (185 lb 13.6 oz)] 84.3 kg (185 lb 13.6 oz) (01/25 0032)    Physical Examination: General:  Elderly male,no distress Neuro: alert/ oriented HEENT:  Supple,no , Rt IJ clean Cardiovascular: S1 S2 , RRR Respic: reduced rt base improved Abdomen:  Non tender to palpation.. BS diminished, still distended Musculoskeletal: no edema   Labs   Lab 10/31/11 0425 10/30/11 0410 10/29/11 0520  NA 133* 133* 136  K 3.7 3.5 3.8  CL 96 96 98  CO2 32 31 33*  BUN 14 13 11   CREATININE 0.97 0.96 0.95  GLUCOSE 106* 119* 86    Lab 10/31/11 0425 10/30/11 0410 10/28/11 0540  HGB 10.1* 10.9* 11.1*  HCT 31.6* 33.2* 34.2*  WBC 7.3 9.5 7.0  PLT 415* 406* 374        Dg Abd Acute W/chest  10/26/2011   IMPRESSION: Right apical and basilar chest tubes.  No pneumothorax is seen. Rt base atx / infiltrate   Assessment and Plan:  MRSA PNA,  Empyema,  Hypoxic resp failure, on vent post op, was on 100% NRB prior to VATS. s/p VATS 1/17, Rt lung decortication and empyema drainage, currently on 2 lit, ileus contribution New rt base atx again 1/24  PLAN -continue pulmonary toilet - titrate fio2 for pulse ox > 92% - chest tubes removed 1/23 pcxr reviewed, he has made clinical improvement today. Rt base may be effusion / atx etc related, afebrile -IS chest pt rt base, dc in am  Continue  lasix same dose, may need to dc in am, follow crt  pcxr in am  Ambulating today  in past did have rt main collapse from blood, dc coumadin, re assess in 1 week need anticoabgulation  Abdominal distention/Ileus. In the setting of recent hospital stay and quinolone therapy Assessment: loose stool, distended belly, diminished bowel sounds, bloody drainage in OG on 1/18. AXR - no ileus 1/18 Tolerating clears  ileus Plan -ambulating Improved daily Having bm huge today See  regimen  A-fib with RVR  Assessment : NSR, rate normal Plan: -on amio drip since 1/14 (startd by PCCM due to tachycardia despite cardizem and soft BP)    Off 1/19     Remains in SR and controlled on oral home cardizem To tele bed  PLAN  home cardizem 180mg  CD24h  -SQ heparin dvt proph since 1/18, dc coumadin with concerns rt main see pulm  Sepsis- primarily PNA/Parapneumonic effusion, possible colitis, r/o empyema. BAL MRSA Assessment: see problem 1 Plan -linazolid, stop date added  Urinary retention Plan: foley out, flomax on board today  ileus -PPI -on diet Improved daily with regimen   HYPOKALEMIA  Lab 10/31/11 0425 10/30/11 0410 10/29/11 0520 10/28/11 0540 10/26/11 0105  K 3.7 3.5 3.8 4.1 3.2*  PLAN wnl Lasix to daily Small k supp 40    Best practices / Disposition: -->  SDU under PCCM; d -->full code --> SCD  -->Protonix for GI Px -->full  To tele bed  [name], MD, [place] Pgr: [place] [facility]

## 2011-11-01 ENCOUNTER — Inpatient Hospital Stay (HOSPITAL_COMMUNITY): Payer: Medicare Other

## 2011-11-01 DIAGNOSIS — J15212 Pneumonia due to Methicillin resistant Staphylococcus aureus: Secondary | ICD-10-CM

## 2011-11-01 DIAGNOSIS — J869 Pyothorax without fistula: Secondary | ICD-10-CM

## 2011-11-01 LAB — GLUCOSE, CAPILLARY
Glucose-Capillary: 115 mg/dL — ABNORMAL HIGH (ref 70–99)
Glucose-Capillary: 94 mg/dL (ref 70–99)
Glucose-Capillary: 113 mg/dL — ABNORMAL HIGH (ref 70–99)

## 2011-11-01 LAB — BASIC METABOLIC PANEL
BUN: 14 mg/dL (ref 6–23)
CO2: 31 mEq/L (ref 19–32)
Chloride: 98 mEq/L (ref 96–112)
Creatinine, Ser: 0.99 mg/dL (ref 0.50–1.35)
GFR calc Af Amer: 84 mL/min — ABNORMAL LOW (ref >90)
Potassium: 3.7 mEq/L (ref 3.5–5.1)

## 2011-11-01 LAB — PROTIME-INR: Prothrombin Time: 21.9 seconds — ABNORMAL HIGH (ref 11.6–15.2)

## 2011-11-01 NOTE — Progress Notes (Signed)
Name: Dean [name] MRN: [id number] DOB: Jan 13, 1924    LOS: 27  [facility] PCCM Progress Note  Brief patient profile:  87 yowm to [facility] on 1/2. Dx eval demonstrated MRSA PNA.Had progressive pleural effusion with thoracentesis on 1/8 showing exudative  > to [facility] on 1/10 for further evaluation of  worsening of Right sided airspace disease, increased work of breathing, AF w/ RVR and progressive leukocytosis.   Lines / Drains: Rt chest tube 1/14 >>>VATS,drainage 1/17>>>1/23 Rt IJ 1/16>>>1/23 ETT 1/17> 1/18   Micro/Sepsis c-diff pcr 1/10>>neg  MRSA pcr>> +ve Sputum 1/7>>>MRSA  Influenza PCR1/2>>> Negative BAL 1/13>>>MRSA R Pleural 1/17 >  MRSA   Antibiotics: Vanco 1/5>>>1/13 Zosyn 1/10>>>1/13 Levaquin 1/2>>>1/10 Rocephin 1/2>>>1/10  primaxin 1/13>>>1/16 zyvox (MRSA PNA/ empyema)1/13>>> plan 3 weeks   Tests / Events: 1/10 CT Chest: increase in right pleural effusion, mucous plugging 1/10 CT abd: no evidence of bowel obstruction or ileus 1/8: thoracentesis on 1/8 yielding a exudative sample consistent with a parapneumonia effusion.   ECHO 10/17/2010: EF 55%, preserved systolic function. Mild aortic stenosis.  1/10- no distress 1/13 - resp distress, hypoxia, complete right lung collapse. S/p Bronchoscopy (DF)- obstructed rt main with blood clot mixed with pus.  1/14- prior exudate knonw, increased effusion, chest tube placed, hydro pneurmo improved 1/14 - CT chest - hydro PNA, PNA 1/14- amio strted 1/15- improved Rt lung aeration post Chest tube placement 1/16- cardizem drip stopped, breathing unchanged, gross tolerated neg balance, no distress 1/17- s/p VATS, empyema drainage, rt lung decortication.  vent post op 1/23- chest tubes removed 1/24- Bm noted, no distress, rt base atx 1/25- no distress  Subjective/Overnight event:   pt did well overnight with no increased wob.  His IS is in a plastic bag in his room.  I have encouraged him to use this hourly.  Denies  significant cough  Vital Signs: Temp:  [97.8 F (36.6 C)-98.4 F (36.9 C)] 98.4 F (36.9 C) (01/26 1424) Pulse Rate:  [79-93] 79  (01/26 1502) Resp:  [14-20] 17  (01/26 1502) BP: (96-114)/(60-70) 105/70 mmHg (01/26 1424) SpO2:  [95 %-99 %] 95 % (01/26 1424)    Physical Examination: Well developed male in nad Chest with basilar crackles, no wheezing Cor with rrr, 2/6 sem abd benign Alert and oriented, moves all 4.    Labs   Lab 11/01/11 0720 10/31/11 0425 10/30/11 0410  NA 136 133* 133*  K 3.7 3.7 3.5  CL 98 96 96  CO2 31 32 31  BUN 14 14 13   CREATININE 0.99 0.97 0.96  GLUCOSE 94 106* 119*    Lab 10/31/11 0425 10/30/11 0410 10/28/11 0540  HGB 10.1* 10.9* 11.1*  HCT 31.6* 33.2* 34.2*  WBC 7.3 9.5 7.0  PLT 415* 406* 374         Assessment and Plan:  MRSA PNA,  Empyema Pt is slowly improving.  Chest tubes are out, and has no increased wob.  I have stressed to him the importance of IS to help with lung re-expansion.  Renal fxn ok, so will continue current diuretic for now.  Will recheck  bmet on Monday.   Kathee Delton, M.D. 11/01/2011, 3:19 PM

## 2011-11-02 LAB — GLUCOSE, CAPILLARY: Glucose-Capillary: 122 mg/dL — ABNORMAL HIGH (ref 70–99)

## 2011-11-02 LAB — PROTIME-INR
INR: 1.57 — ABNORMAL HIGH (ref 0.00–1.49)
Prothrombin Time: 19.1 seconds — ABNORMAL HIGH (ref 11.6–15.2)

## 2011-11-02 NOTE — Progress Notes (Signed)
Name: Dean [name] MRN: [id number] DOB: 1923-10-14    LOS: 55  [facility] PCCM Progress Note  Brief patient profile:  87 yowm to [facility] on 1/2. Dx eval demonstrated MRSA PNA.Had progressive pleural effusion with thoracentesis on 1/8 showing exudative  > to [facility] on 1/10 for further evaluation of  worsening of Right sided airspace disease, increased work of breathing, AF w/ RVR and progressive leukocytosis.   Lines / Drains: Rt chest tube 1/14 >>>VATS,drainage 1/17>>>1/23 Rt IJ 1/16>>>1/23 ETT 1/17> 1/18   Micro/Sepsis c-diff pcr 1/10>>neg  MRSA pcr>> +ve Sputum 1/7>>>MRSA  Influenza PCR1/2>>> Negative BAL 1/13>>>MRSA R Pleural 1/17 >  MRSA   Antibiotics: Vanco 1/5>>>1/13 Zosyn 1/10>>>1/13 Levaquin 1/2>>>1/10 Rocephin 1/2>>>1/10  primaxin 1/13>>>1/16 zyvox (MRSA PNA/ empyema)1/13>>> plan 3 weeks   Tests / Events: 1/10 CT Chest: increase in right pleural effusion, mucous plugging 1/10 CT abd: no evidence of bowel obstruction or ileus 1/8: thoracentesis on 1/8 yielding a exudative sample consistent with a parapneumonia effusion.   ECHO 10/17/2010: EF 55%, preserved systolic function. Mild aortic stenosis.  1/10- no distress 1/13 - resp distress, hypoxia, complete right lung collapse. S/p Bronchoscopy (DF)- obstructed rt main with blood clot mixed with pus.  1/14- prior exudate knonw, increased effusion, chest tube placed, hydro pneurmo improved 1/14 - CT chest - hydro PNA, PNA 1/14- amio strted 1/15- improved Rt lung aeration post Chest tube placement 1/16- cardizem drip stopped, breathing unchanged, gross tolerated neg balance, no distress 1/17- s/p VATS, empyema drainage, rt lung decortication.  vent post op 1/23- chest tubes removed 1/24- Bm noted, no distress, rt base atx 1/25- no distress  Subjective/Overnight event:   pt did well overnight with no increased wob.  Using IS at bedside                                                                                 Vital Signs: Temp:  [98.3 F (36.8 C)-98.5 F (36.9 C)] 98.3 F (36.8 C) (01/27 0539) Pulse Rate:  [79-96] 86  (01/27 0539) Resp:  [16-18] 18  (01/27 0539) BP: (102-106)/(56-70) 106/58 mmHg (01/27 1004) SpO2:  [93 %-96 %] 94 % (01/27 [id number])    Physical Examination: Well developed male in nad Chest with basilar crackles, no wheezing, decreased BS Cor with rrr, 2/6 sem abd benign Alert and oriented, moves all 4.    Labs   Lab 11/01/11 0720 10/31/11 0425 10/30/11 0410  NA 136 133* 133*  K 3.7 3.7 3.5  CL 98 96 96  CO2 31 32 31  BUN 14 14 13   CREATININE 0.99 0.97 0.96  GLUCOSE 94 106* 119*    Lab 10/31/11 0425 10/30/11 0410 10/28/11 0540  HGB 10.1* 10.9* 11.1*  HCT 31.6* 33.2* 34.2*  WBC 7.3 9.5 7.0  PLT 415* 406* 374         Assessment and Plan:  MRSA PNA,  Empyema Pt is slowly improving.  Chest tubes are out, and has no increased wob.  I have stressed to him the importance of IS to help with lung re-expansion.    -advance diet - d/c CBG's - check labs in am.   Kathee Delton, M.D. 11/02/2011, 12:43  PM

## 2011-11-02 NOTE — Progress Notes (Addendum)
1 tablet wasted with stephanie RN, pt only wanted 1 tab. Sharyn Lull Agricultural consultant witnessed.

## 2011-11-02 NOTE — Progress Notes (Signed)
10 Days Post-Op Procedure(s) (LRB): VIDEO ASSISTED THORACOSCOPY (VATS)/THOROCOTOMY (Right) Subjective: No complaints  Objective: Vital signs in last 24 hours: Temp:  [98.3 F (36.8 C)-98.5 F (36.9 C)] 98.3 F (36.8 C) (01/27 0539) Pulse Rate:  [79-96] 86  (01/27 0539) Cardiac Rhythm:  [-] Normal sinus rhythm (01/27 0807) Resp:  [16-18] 18  (01/27 0539) BP: (102-106)/(56-70) 106/58 mmHg (01/27 1004) SpO2:  [93 %-96 %] 94 % (01/27 0918)  Hemodynamic parameters for last 24 hours:    Intake/Output from previous day: 01/26 0701 - 01/27 0700 In: 720 [P.O.:720] Out: 678 [Urine:675; Stool:3] Intake/Output this shift:    Wound: chest incision is healing well.  Will remove staples and chest tube sutures.  Lab Results:  [facility] 10/31/11 0425  WBC 7.3  HGB 10.1*  HCT 31.6*  PLT 415*   BMET:  Basename 11/01/11 0720 10/31/11 0425  NA 136 133*  K 3.7 3.7  CL 98 96  CO2 31 32  GLUCOSE 94 106*  BUN 14 14  CREATININE 0.99 0.97  CALCIUM 8.4 7.9*    PT/INR:  Basename 11/02/11 0500  LABPROT 19.1*  INR 1.57*   ABG    Component Value Date/Time   PHART 7.548* 10/24/2011 0534   HCO3 36.2* 10/24/2011 0534   TCO2 37 10/24/2011 0534   ACIDBASEDEF 1.0 10/19/2011 1611   O2SAT 93.0 10/24/2011 0534   CBG (last 3)   Basename 11/02/11 1120 11/02/11 0916 11/02/11 0529  GLUCAP 132* 122* 99   CXR:  Stable postop changes at right base.  Assessment/Plan: S/P Procedure(s) (LRB): VIDEO ASSISTED THORACOSCOPY (VATS)/THOROCOTOMY (Right) DC chest tube sutures and staples   LOS: 17 days    BARTLE,BRYAN K 11/02/2011

## 2011-11-02 NOTE — Progress Notes (Signed)
Chest tube sutures removed. Staples removed. Pt tolerated fine. Denies pain. Will continue to monitor.

## 2011-11-03 ENCOUNTER — Inpatient Hospital Stay (HOSPITAL_COMMUNITY)
Admission: RE | Admit: 2011-11-03 | Discharge: 2011-11-08 | DRG: 945 | Disposition: A | Discharge status: Home Health | Payer: Medicare Other | Source: Ambulatory Visit | Attending: Physical Medicine & Rehabilitation | Admitting: Physical Medicine & Rehabilitation

## 2011-11-03 DIAGNOSIS — J9600 Acute respiratory failure, unspecified whether with hypoxia or hypercapnia: Secondary | ICD-10-CM

## 2011-11-03 DIAGNOSIS — A419 Sepsis, unspecified organism: Secondary | ICD-10-CM | POA: Diagnosis present

## 2011-11-03 DIAGNOSIS — D696 Thrombocytopenia, unspecified: Secondary | ICD-10-CM | POA: Diagnosis present

## 2011-11-03 DIAGNOSIS — J45909 Unspecified asthma, uncomplicated: Secondary | ICD-10-CM | POA: Diagnosis present

## 2011-11-03 DIAGNOSIS — E876 Hypokalemia: Secondary | ICD-10-CM | POA: Diagnosis present

## 2011-11-03 DIAGNOSIS — I359 Nonrheumatic aortic valve disorder, unspecified: Secondary | ICD-10-CM | POA: Diagnosis not present

## 2011-11-03 DIAGNOSIS — I4891 Unspecified atrial fibrillation: Secondary | ICD-10-CM | POA: Diagnosis present

## 2011-11-03 DIAGNOSIS — M542 Cervicalgia: Secondary | ICD-10-CM | POA: Diagnosis present

## 2011-11-03 DIAGNOSIS — J15212 Pneumonia due to Methicillin resistant Staphylococcus aureus: Secondary | ICD-10-CM | POA: Diagnosis present

## 2011-11-03 DIAGNOSIS — R5381 Other malaise: Secondary | ICD-10-CM

## 2011-11-03 DIAGNOSIS — G4700 Insomnia, unspecified: Secondary | ICD-10-CM | POA: Diagnosis present

## 2011-11-03 DIAGNOSIS — Z8614 Personal history of Methicillin resistant Staphylococcus aureus infection: Secondary | ICD-10-CM | POA: Diagnosis not present

## 2011-11-03 DIAGNOSIS — D649 Anemia, unspecified: Secondary | ICD-10-CM | POA: Diagnosis present

## 2011-11-03 DIAGNOSIS — Z5189 Encounter for other specified aftercare: Principal | ICD-10-CM

## 2011-11-03 DIAGNOSIS — J860 Pyothorax with fistula: Secondary | ICD-10-CM

## 2011-11-03 DIAGNOSIS — Z8546 Personal history of malignant neoplasm of prostate: Secondary | ICD-10-CM

## 2011-11-03 DIAGNOSIS — J869 Pyothorax without fistula: Secondary | ICD-10-CM | POA: Diagnosis not present

## 2011-11-03 DIAGNOSIS — J159 Unspecified bacterial pneumonia: Secondary | ICD-10-CM

## 2011-11-03 DIAGNOSIS — M109 Gout, unspecified: Secondary | ICD-10-CM | POA: Diagnosis present

## 2011-11-03 DIAGNOSIS — I10 Essential (primary) hypertension: Secondary | ICD-10-CM | POA: Insufficient documentation

## 2011-11-03 LAB — CBC
Platelets: 409 10*3/uL — ABNORMAL HIGH (ref 150–400)
RDW: 14.9 % (ref 11.5–15.5)
WBC: 6.1 10*3/uL (ref 4.0–10.5)

## 2011-11-03 LAB — BASIC METABOLIC PANEL
Calcium: 8.2 mg/dL — ABNORMAL LOW (ref 8.4–10.5)
Chloride: 98 mEq/L (ref 96–112)
Creatinine, Ser: 1.04 mg/dL (ref 0.50–1.35)
GFR calc Af Amer: 73 mL/min — ABNORMAL LOW (ref >90)

## 2011-11-03 LAB — PROTIME-INR
INR: 1.35 (ref 0.00–1.49)
Prothrombin Time: 16.9 seconds — ABNORMAL HIGH (ref 11.6–15.2)

## 2011-11-03 MED ORDER — PANTOPRAZOLE SODIUM 40 MG PO TBEC: 40.0000 mg | DELAYED_RELEASE_TABLET | Freq: Every day | ORAL | Status: DC

## 2011-11-03 MED ORDER — LINEZOLID 600 MG PO TABS
600.0000 mg | ORAL_TABLET | Freq: Two times a day (BID) | ORAL | Status: DC
  Administered 2011-11-03 – 2011-11-08 (×10): 600 mg via ORAL
  Filled 2011-11-03 (×12): qty 1

## 2011-11-03 MED ORDER — FLEET ENEMA 7-19 GM/118ML RE ENEM
1.0000 | ENEMA | Freq: Once | RECTAL | Status: AC | PRN
  Filled 2011-11-03: qty 1

## 2011-11-03 MED ORDER — SIMVASTATIN 20 MG PO TABS
20.0000 mg | ORAL_TABLET | Freq: Every day | ORAL | Status: DC
  Administered 2011-11-03 – 2011-11-07 (×5): 20 mg via ORAL
  Filled 2011-11-03 (×6): qty 1

## 2011-11-03 MED ORDER — DILTIAZEM HCL ER COATED BEADS 180 MG PO CP24
180.0000 mg | ORAL_CAPSULE | Freq: Every day | ORAL | Status: DC
  Administered 2011-11-04 – 2011-11-08 (×5): 180 mg via ORAL
  Filled 2011-11-03 (×7): qty 1

## 2011-11-03 MED ORDER — TAMSULOSIN HCL 0.4 MG PO CAPS: 0.4000 mg | ORAL_CAPSULE | Freq: Every day | ORAL | Status: DC

## 2011-11-03 MED ORDER — LIDOCAINE 5 % EX PTCH
1.0000 | MEDICATED_PATCH | CUTANEOUS | Status: DC
  Administered 2011-11-04 – 2011-11-07 (×4): 1 via TRANSDERMAL
  Filled 2011-11-03 (×6): qty 1

## 2011-11-03 MED ORDER — PROMETHAZINE HCL 25 MG/ML IJ SOLN: 12.5000 mg | Freq: Four times a day (QID) | INTRAMUSCULAR | Status: DC | PRN

## 2011-11-03 MED ORDER — GUAIFENESIN-DM 100-10 MG/5ML PO SYRP: 5.0000 mL | ORAL_SOLUTION | Freq: Four times a day (QID) | ORAL | Status: DC | PRN

## 2011-11-03 MED ORDER — ALLOPURINOL 300 MG PO TABS
300.0000 mg | ORAL_TABLET | Freq: Every day | ORAL | Status: DC
  Administered 2011-11-04 – 2011-11-08 (×5): 300 mg via ORAL
  Filled 2011-11-03 (×6): qty 1

## 2011-11-03 MED ORDER — TRAZODONE 25 MG HALF TABLET: 25.0000 mg | ORAL_TABLET | Freq: Every evening | ORAL | Status: DC | PRN

## 2011-11-03 MED ORDER — POLYETHYLENE GLYCOL 3350 17 G PO PACK
17.0000 g | PACK | Freq: Every day | ORAL | Status: DC | PRN
  Filled 2011-11-03: qty 1

## 2011-11-03 MED ORDER — OXYCODONE-ACETAMINOPHEN 5-325 MG PO TABS
1.0000 | ORAL_TABLET | ORAL | Status: DC | PRN
  Administered 2011-11-03 – 2011-11-08 (×7): 1 via ORAL
  Filled 2011-11-03 (×8): qty 1

## 2011-11-03 MED ORDER — BISACODYL 10 MG RE SUPP: 10.0000 mg | Freq: Every day | RECTAL | Status: DC | PRN

## 2011-11-03 MED ORDER — DICLOFENAC SODIUM 1 % TD GEL
Freq: Three times a day (TID) | TRANSDERMAL | Status: DC | PRN
  Filled 2011-11-03: qty 100

## 2011-11-03 MED ORDER — PROMETHAZINE HCL 12.5 MG PO TABS: 12.5000 mg | ORAL_TABLET | Freq: Four times a day (QID) | ORAL | Status: DC | PRN

## 2011-11-03 MED ORDER — TAMSULOSIN HCL 0.4 MG PO CAPS
0.4000 mg | ORAL_CAPSULE | Freq: Every day | ORAL | Status: DC
  Administered 2011-11-04 – 2011-11-08 (×5): 0.4 mg via ORAL
  Filled 2011-11-03 (×6): qty 1

## 2011-11-03 MED ORDER — PROMETHAZINE HCL 12.5 MG RE SUPP: 12.5000 mg | Freq: Four times a day (QID) | RECTAL | Status: DC | PRN

## 2011-11-03 MED ORDER — WARFARIN SODIUM 2.5 MG PO TABS
2.5000 mg | ORAL_TABLET | Freq: Once | ORAL | Status: DC
  Filled 2011-11-03: qty 1

## 2011-11-03 MED ORDER — DILTIAZEM HCL ER COATED BEADS 180 MG PO CP24: 180.0000 mg | ORAL_CAPSULE | Freq: Every day | ORAL | Status: DC

## 2011-11-03 MED ORDER — ACETAMINOPHEN 325 MG PO TABS: 325.0000 mg | ORAL_TABLET | ORAL | Status: DC | PRN

## 2011-11-03 MED ORDER — PREGABALIN 50 MG PO CAPS
50.0000 mg | ORAL_CAPSULE | ORAL | Status: DC
  Administered 2011-11-03 – 2011-11-07 (×3): 50 mg via ORAL
  Filled 2011-11-03 (×3): qty 1

## 2011-11-03 MED ORDER — ENOXAPARIN SODIUM 40 MG/0.4ML ~~LOC~~ SOLN: 40.0000 mg | SUBCUTANEOUS | Status: DC

## 2011-11-03 MED ORDER — ALUM & MAG HYDROXIDE-SIMETH 400-400-40 MG/5ML PO SUSP: 30.0000 mL | ORAL | Status: DC | PRN

## 2011-11-03 MED ORDER — SACCHAROMYCES BOULARDII 250 MG PO CAPS
500.0000 mg | ORAL_CAPSULE | Freq: Two times a day (BID) | ORAL | Status: DC
  Administered 2011-11-03: 500 mg via ORAL
  Administered 2011-11-04: 250 mg via ORAL
  Administered 2011-11-04 – 2011-11-08 (×8): 500 mg via ORAL
  Filled 2011-11-03 (×12): qty 2

## 2011-11-03 MED ORDER — DIPHENHYDRAMINE HCL 12.5 MG/5ML PO ELIX: 12.5000 mg | ORAL_SOLUTION | Freq: Four times a day (QID) | ORAL | Status: DC | PRN

## 2011-11-03 MED ORDER — WARFARIN SODIUM 2.5 MG PO TABS
2.5000 mg | ORAL_TABLET | Freq: Once | ORAL | Status: AC
  Administered 2011-11-03: 2.5 mg via ORAL
  Filled 2011-11-03: qty 1

## 2011-11-03 MED ORDER — OXYCODONE-ACETAMINOPHEN 5-325 MG PO TABS: 2.0000 | ORAL_TABLET | ORAL | Status: DC | PRN

## 2011-11-03 MED ORDER — ONDANSETRON HCL 4 MG PO TABS: 4.0000 mg | ORAL_TABLET | Freq: Three times a day (TID) | ORAL | Status: DC | PRN

## 2011-11-03 MED ORDER — ZOLPIDEM TARTRATE 5 MG PO TABS: 5.0000 mg | ORAL_TABLET | Freq: Every evening | ORAL | Status: DC | PRN

## 2011-11-03 MED ORDER — LINEZOLID 2 MG/ML IV SOLN: 600.0000 mg | Freq: Two times a day (BID) | INTRAVENOUS | Status: DC

## 2011-11-03 MED ORDER — FLEET ENEMA 7-19 GM/118ML RE ENEM: 1.0000 | ENEMA | Freq: Once | RECTAL | Status: DC | PRN

## 2011-11-03 MED ORDER — HYDROXYUREA 500 MG PO CAPS: 500.0000 mg | ORAL_CAPSULE | ORAL | Status: DC

## 2011-11-03 MED ORDER — OXYCODONE-ACETAMINOPHEN 5-325 MG PO TABS: 1.0000 | ORAL_TABLET | ORAL | Status: DC | PRN

## 2011-11-03 MED ORDER — POTASSIUM CHLORIDE CRYS ER 20 MEQ PO TBCR: 20.0000 meq | EXTENDED_RELEASE_TABLET | Freq: Two times a day (BID) | ORAL | Status: DC

## 2011-11-03 MED ORDER — LEVALBUTEROL HCL 0.63 MG/3ML IN NEBU
0.6300 mg | INHALATION_SOLUTION | Freq: Four times a day (QID) | RESPIRATORY_TRACT | Status: DC
  Administered 2011-11-03 – 2011-11-06 (×11): 0.63 mg via RESPIRATORY_TRACT
  Filled 2011-11-03 (×16): qty 3

## 2011-11-03 MED ORDER — POLYETHYLENE GLYCOL 3350 17 G PO PACK: 17.0000 g | PACK | Freq: Every day | ORAL | Status: DC | PRN

## 2011-11-03 MED ORDER — LEVALBUTEROL HCL 0.63 MG/3ML IN NEBU: 0.6300 mg | INHALATION_SOLUTION | Freq: Four times a day (QID) | RESPIRATORY_TRACT | Status: DC

## 2011-11-03 MED ORDER — ALUM & MAG HYDROXIDE-SIMETH 200-200-20 MG/5ML PO SUSP: 30.0000 mL | ORAL | Status: DC | PRN

## 2011-11-03 MED ORDER — ENOXAPARIN SODIUM 40 MG/0.4ML ~~LOC~~ SOLN
40.0000 mg | Freq: Every day | SUBCUTANEOUS | Status: DC
  Administered 2011-11-04 – 2011-11-08 (×5): 40 mg via SUBCUTANEOUS
  Filled 2011-11-03 (×6): qty 0.4

## 2011-11-03 MED ORDER — PANTOPRAZOLE SODIUM 40 MG PO TBEC
40.0000 mg | DELAYED_RELEASE_TABLET | Freq: Every day | ORAL | Status: DC
  Administered 2011-11-03 – 2011-11-08 (×5): 40 mg via ORAL
  Filled 2011-11-03 (×5): qty 1

## 2011-11-03 MED ORDER — CALCIUM POLYCARBOPHIL 625 MG PO TABS
1250.0000 mg | ORAL_TABLET | Freq: Every day | ORAL | Status: DC
  Administered 2011-11-03 – 2011-11-08 (×6): 1250 mg via ORAL
  Filled 2011-11-03 (×9): qty 2

## 2011-11-03 NOTE — H&P (Signed)
Physical Medicine and Rehabilitation Admission H&P  Chief complaint: Deconditioned  HPI : Mr. Efosa Sircy is an 78 year old white right-handed male initially admitted to [facility] on January 2 and diagnosed with MRSA pneumonia with progressive pleural effusion. He underwent thoracentesis on January 8 and ultimately transferred to [place] on January 10 for treatment of worsening right sided airspace disease, increased WOB, afib with RVR and progressive leucocytosis. Treated with cardizem and amiodarone IV and continued on IV antibiotics. .Patient been on chronic Coumadin therapy prior to admission for atrial fibrillation. Echocardiogram on January 12 with ejection fraction [id number] preserved systolic function mild aortic stenosis. Patient developed large right pleural effusion with complete right lung collapse and underwent chest tube insertion by Dr. [name] on January 14. Placed on linazoid and impenem for sepsis due to PNA and possible colitis. Required BIPAP for respiratory support. Patient with persistent moderate to large loculated pleural fluid collection suggesting empyema and required right VATS with decortication and drainage of empyema on January 17 per Dr. Cyndia Bent. Chest tubes d/c 01/22. Post procedure with ileus treated and diet slowly advanced. Fluid overload treated with diuretics. MRSA PNA/empyema to be treated with three weeks of zyxox.  Review of Systems  HENT: Positive for hearing loss and neck pain.  Eyes: Negative for blurred vision and double vision.  Respiratory: Positive for cough and shortness of breath.  Cardiovascular: Positive for chest pain (right chest wall with deep breaths) and leg swelling.  Gastrointestinal: Negative for abdominal pain and constipation.  Genitourinary:  Hesitancy.  Musculoskeletal: Positive for joint pain.  Neurological: Negative for tingling.  Psychiatric/Behavioral: Negative for depression. The patient is not nervous/anxious.   Past Medical History     Diagnosis  Date   .  PAF (paroxysmal atrial fibrillation)      followed by [place] cardiology   .  Aortic valve disease      followed by [facility] cardiology   .  Prostate cancer      s/p seed implants   .  Environmental allergies    .  Asthma    .  Essential thrombocytopenia      followed by Dr Bobby Rumpf (hematology)   .  Neuropathy    .  Insomnia    .  Gout    .  Obesity    .  Hypertension    .  Hyperlipidemia    .  Osteoarthritis    .  Hypoxia, sleep related     Surgical history:  Lap chole  Exc skin CA-BCC  Excision cataracts with IOL implant  T and A  Exc mass from back.  Exc nasal polyps  Family History   Problem  Relation  Age of Onset   .  Asthma  Maternal Grandmother    .  Tuberculosis  Maternal Grandmother    .  Heart disease  Father       died of heart disease    .  Alzheimer's disease  Mother     Social History: Married. Retired Dealer. He reports that he has never smoked. He does not have any smokeless tobacco history on file. His alcohol and drug histories not on file. Wife in good health and can provide assistance past discharge.  Allergies: No Known Allergies  Hospital Medications:   .  diltiazem  180 mg  Oral  Daily   .  heparin subcutaneous  5,000 Units  Subcutaneous  Q8H   .  levalbuterol  0.63 mg  Nebulization  Q6H   .  linezolid  600 mg  Intravenous  Q12H   .  pantoprazole  40 mg  Oral  QHS   .  Tamsulosin HCl  0.4 mg  Oral  Daily   .  DISCONTD: antiseptic oral rinse  15 mL  Mouth Rinse  q12n4p   .  DISCONTD: bisacodyl  10 mg  Oral  Daily   .  DISCONTD: chlorhexidine  15 mL  Mouth Rinse  BID   .  DISCONTD: furosemide  20 mg  Intravenous  Daily   .  DISCONTD: insulin aspart  0-9 Units  Subcutaneous  Q4H    Medications Prior to Admission   Medication  Sig  Dispense  Refill   .  albuterol (ACCUNEB) 1.25 MG/3ML nebulizer solution  Take 1 ampule by nebulization 2 (two) times daily.     Marland Kitchen  allopurinol (ZYLOPRIM) 300 MG tablet  Take 300 mg by  mouth daily.     .  budesonide (PULMICORT) 0.25 MG/2ML nebulizer solution  Take 0.25 mg by nebulization 2 (two) times daily.     .  diclofenac sodium (VOLTAREN) 1 % GEL  Apply 1 application topically daily as needed.     .  diltiazem (CARDIZEM CD) 180 MG 24 hr capsule  Take 180 mg by mouth daily.     .  hydroxyurea (HYDREA) 500 MG capsule  Take 500 mg by mouth every other day. May take with food to minimize GI side effects.     .  pregabalin (LYRICA) 50 MG capsule  Take 50 mg by mouth every other day     .  simvastatin (ZOCOR) 20 MG tablet  Take 10 mg by mouth every evening.     .  warfarin (COUMADIN) 4 MG tablet  Take 4 mg by mouth See admin instructions. 4mg  on Monday, Wednesday, Thursday, Saturday, Sunday     .  warfarin (COUMADIN) 5 MG tablet  Take 5 mg by mouth See admin instructions. Tuesday, Friday     .  zolpidem (AMBIEN) 5 MG tablet  Take 5 mg by mouth at bedtime as needed.     Home:  Home Living  Lives With: Spouse;Daughter  Receives Help From: Family  Type of Home: House  Home Layout: One level (with basement)  Home Access: Stairs to enter  Entrance Stairs-Rails: None  Entrance Stairs-Number of Steps: 1  Home Adaptive Equipment: None  Functional History:  Prior Function  Level of Independence: Independent with basic ADLs;Independent with homemaking with ambulation;Independent with gait;Independent with transfers  Able to Take Stairs?: Reciprically  Driving: Yes  Functional Status:  Mobility:  Bed Mobility  Bed Mobility: Yes  Rolling Right: 3: Mod assist;With rail  Rolling Right Details (indicate cue type and reason): cues for encouragement and sequencing  Rolling Left: 3: Mod assist;With rail  Right Sidelying to Sit: 3: Mod assist;HOB elevated (comment degrees) (HOB ~30degrees)  Right Sidelying to Sit Details (indicate cue type and reason): cues for sequencing and encouragement  Sitting - Scoot to Edge of Bed: 2: Max assist  Transfers  Transfers: Yes  Sit to Stand: 1:  +2 Total assist;Patient percentage (comment);With upper extremity assist;From chair/3-in-1 (pt 60%)  Sit to Stand Details (indicate cue type and reason): pt nedds cues and A for anterior wt shift  Stand to Sit: 1: +2 Total assist;Patient percentage (comment);With upper extremity assist;To chair/3-in-1 (pt 60%)  Stand to Sit Details: pt needed increased A to control descent  Stand Pivot Transfers: 1: +2 Total assist;Patient percentage (comment) (pt  60%)  Stand Pivot Transfer Details (indicate cue type and reason): cues for use of UEs and use of RW  Ambulation/Gait  Ambulation/Gait: No  Ambulation/Gait Assistance: 1: +2 Total assist;Patient percentage (comment) (pt 80%)  Ambulation/Gait Assistance Details (indicate cue type and reason): Pivotal steps only. pt fatigued, but eager for OOB.  Ambulation Distance (Feet): (pivotal steps only. )  Assistive device: Rolling walker  Gait Pattern: Step-through pattern;Decreased stride length;Trunk flexed  Stairs: No  Wheelchair Mobility  Wheelchair Mobility: No  ADL:   Cognition:  Cognition  Orientation Level: Oriented X4  Cognition  Orientation Level: Oriented X4  Blood pressure 102/65, pulse 81, temperature 98.1 F (36.7 C), temperature source Oral, resp. rate 18, height 6' (1.829 m), weight 84.3 kg (185 lb 13.6 oz), SpO2 95.00%.  Physical Exam  Constitutional: He is oriented to person, place, and time. He appears well-developed and well-nourished.  HENT:  Head: Normocephalic and atraumatic.  Eyes: Pupils are equal, round, and reactive to light.  Neck: Normal range of motion. Neck supple.  Cardiovascular: Normal rate and regular rhythm.  Pulmonary/Chest: Effort normal.  Decreased RLL and RML.  Abdominal: Soft. Bowel sounds are normal.  Abdomen with bowel sounds positive but distended.  Musculoskeletal: Normal range of motion.  1+ edema BLE >BUE with tenting.  Lymphadenopathy:  Lower extremities with 1-2+ pitting edema.  Neurological: He  is alert and oriented to person, place, and time.  He is hard of hearing otherwise cranial nerves appear grossly intact. Strength in the upper extremities grossly 4/5 proximal to 4+ out of 5 distally. Lower extremity strength grossly 2-5-2+ with hip flexion 3/5 knee extension 4/5 ankle dorsiflexion and plantarflexion today. Reflexes 1+. Sensory exam grossly intact in all 4 limbs perhaps with some diminishment due to edema in the legs.  Skin: Skin is warm and dry.   Results for orders placed during the hospital encounter of 10/16/11 (from the past 48 hour(s))   GLUCOSE, CAPILLARY Status: Abnormal    Collection Time    11/01/11 4:18 PM   Component  Value  Range  Comment    Glucose-Capillary  113 (*)  70 - 99 (mg/dL)    GLUCOSE, CAPILLARY Status: Abnormal    Collection Time    11/01/11 9:16 PM   Component  Value  Range  Comment    Glucose-Capillary  111 (*)  70 - 99 (mg/dL)    GLUCOSE, CAPILLARY Status: Abnormal    Collection Time    11/01/11 11:56 PM   Component  Value  Range  Comment    Glucose-Capillary  136 (*)  70 - 99 (mg/dL)     Comment 1  Documented in Chart      Comment 2  Notify RN     PROTIME-INR Status: Abnormal    Collection Time    11/02/11 5:00 AM   Component  Value  Range  Comment    Prothrombin Time  19.1 (*)  11.6 - 15.2 (seconds)     INR  1.57 (*)  0.00 - 1.49    GLUCOSE, CAPILLARY Status: Normal    Collection Time    11/02/11 5:29 AM   Component  Value  Range  Comment    Glucose-Capillary  99  70 - 99 (mg/dL)    GLUCOSE, CAPILLARY Status: Abnormal    Collection Time    11/02/11 9:16 AM   Component  Value  Range  Comment    Glucose-Capillary  122 (*)  70 - 99 (mg/dL)    GLUCOSE, CAPILLARY  Status: Abnormal    Collection Time    11/02/11 11:20 AM   Component  Value  Range  Comment    Glucose-Capillary  132 (*)  70 - 99 (mg/dL)    PROTIME-INR Status: Abnormal    Collection Time    11/03/11 8:44 AM   Component  Value  Range  Comment    Prothrombin Time  16.9 (*)   11.6 - 15.2 (seconds)     INR  1.35  0.00 - [id number]    BASIC METABOLIC PANEL Status: Abnormal    Collection Time    11/03/11 8:44 AM   Component  Value  Range  Comment    Sodium  137  135 - 145 (mEq/L)     Potassium  3.4 (*)  3.5 - 5.1 (mEq/L)     Chloride  98  96 - 112 (mEq/L)     CO2  30  19 - 32 (mEq/L)     Glucose, Bld  105 (*)  70 - 99 (mg/dL)     BUN  11  6 - 23 (mg/dL)     Creatinine, Ser  1.04  0.50 - 1.35 (mg/dL)     Calcium  8.2 (*)  8.4 - 10.5 (mg/dL)     GFR calc non Af Amer  63 (*)  >90 (mL/min)     GFR calc Af Amer  73 (*)  >90 (mL/min)    CBC Status: Abnormal    Collection Time    11/03/11 8:44 AM   Component  Value  Range  Comment    WBC  6.1  4.0 - 10.5 (K/uL)     RBC  3.27 (*)  4.22 - 5.81 (MIL/uL)     Hemoglobin  10.3 (*)  13.0 - 17.0 (g/dL)     HCT  31.8 (*)  39.0 - 52.0 (%)     MCV  97.2  78.0 - 100.0 (fL)     MCH  31.5  26.0 - 34.0 (pg)     MCHC  32.4  30.0 - 36.0 (g/dL)     RDW  14.9  11.5 - 15.5 (%)     Platelets  409 (*)  150 - 400 (K/uL)     No results found.  Post Admission Physician Evaluation:  1. Functional deficits secondary to MRSA pneumonia/empyema status post VATS with subsequent deconditioning. 2. Patient is admitted to receive collaborative, interdisciplinary care between the physiatrist, rehab nursing staff, and therapy team. 3. Patient's level of medical complexity and substantial therapy needs in context of that medical necessity cannot be provided at a lesser intensity of care such as a SNF. 4. Patient has experienced substantial functional loss from his/her baseline which was documented above under the "Functional History" and "Functional Status" headings. Judging by the patient's diagnosis, physical exam, and functional history, the patient has potential for functional progress which will result in measurable gains while on inpatient rehab. These gains will be of substantial and practical use upon discharge in facilitating mobility and self-care  at the household level. 5. Physiatrist will provide 24 hour management of medical needs as well as oversight of the therapy plan/treatment and provide guidance as appropriate regarding the interaction of the two. 6. 24 hour rehab nursing will assist with bladder management, bowel management, safety, skin/wound care, disease management, medication administration, pain management and patient education and help integrate therapy concepts, techniques,education, etc. 7. PT will assess and treat for: Lower extremity strength, gait, balance, cardiopulmonary and muscular endurance, adaptive equipment  and safety. Goals are: Supervision to modified independent. 8. OT will assess and treat for: Upper extremity strength, pain management, ADLs, functional mobility, adaptive equipment. Goals are: Modified independent to supervision. 9. SLP will assess and treat for:  10. Case Management and Social Worker will assess and treat for psychological issues and discharge planning. 11. Team conference will be held weekly to assess progress toward goals and to determine barriers to discharge. 12. Patient will receive at least 3 hours of therapy per day at least 5 days per week. 13. ELOS and Prognosis: 10-14 days excellent Medical Problem List and Plan:  1. DVT Prophylaxis/Anticoagulation: Pharmaceutical: Coumdin . Lovenox till INR therapeutic.  2. Pain Management: Prn percocet for chest wall/rib pain. Can also utilize ice and/or heat. Consider Lidoderm patches  3. Mood: monitor for now.  4. MRSA empyema/PNA: Continue zyvox thorough 02/3. Contact precautions  5. Afib with RVR: Monitor HR with bid checks. Continue cardizem-off amiodarone.  6. Fluid overload with third spacing: Low salt diet. Off diuretics. Check daily weights.  7. Hypokalemia: Likely due to diuretics. Continue supplement and monitor routinely. Need to keep supplemented to avoid arrthymias as well as recurrence of ileus.  8. Essential thrombocytopenia:  resume hydrea.  9. Gout: Resume allopurinol.  9. Cervicalgia: uses Voltaren gel prn.  10. Asthma with nocturnal hypoxia: Continue nebs qid. Oxygen per [place] to keep sats >90%  Oval Linsey, MD  11/03/11 1805

## 2011-11-03 NOTE — Progress Notes (Signed)
ANTICOAGULATION CONSULT NOTE - Initial Consult  Pharmacy Consult:  Coumadin Indication: h/o AFib  No Known Allergies  Patient Measurements: Height: 6' (182.9 cm) Weight: 185 lb 13.6 oz (84.3 kg) IBW/kg (Calculated) : 77.6   Vital Signs: Temp: 98.4 F (36.9 C) (01/28 1341) Temp src: Oral (01/28 0556) BP: 108/64 mmHg (01/28 1341) Pulse Rate: 91  (01/28 1341)  Labs:  Basename 11/03/11 0844 11/02/11 0500 11/01/11 0720  HGB 10.3* -- --  HCT 31.8* -- --  PLT 409* -- --  APTT -- -- --  LABPROT 16.9* 19.1* 21.9*  INR 1.35 1.57* 1.88*  HEPARINUNFRC -- -- --  CREATININE 1.04 -- 0.99  CKTOTAL -- -- --  CKMB -- -- --  TROPONINI -- -- --   Estimated Creatinine Clearance: 57 ml/min (by C-G formula based on Cr of 1.04).  Medical History: Past Medical History  Diagnosis Date  . PAF (paroxysmal atrial fibrillation)     followed by [place] cardiology  . Aortic valve disease     followed by [facility] cardiology  . Prostate cancer     s/p seed implants  . Environmental allergies   . Asthma   . Essential thrombocytopenia     followed by Dr Bobby Rumpf (hematology)  . Neuropathy   . Insomnia   . Gout   . Obesity   . Hypertension   . Hyperlipidemia   . Osteoarthritis   . Hypoxia, sleep related     Assessment: 87 YOM with hx of Afib on Coumadin PTA.  Coumadin has been on hold since 10/16/11. INR 1.35 today.  s/p VATS and Coumadin to resume per MD. On heparin SQ tid.   PTA regimen was 5mg  on Tues, Fri. And 4mg  on Mon, Wed, Thurs, Sat, Sun.   Goal of Therapy:  INR 2-3   Plan:  - Start Coumadin 2.5mg  po x1 tonight.  - Daily INR.  - D/C heparin SQ once INR therapeutic   Rober Minion, PharmD., [place] Clinical Pharmacist Pager:  [phone] 11/03/2011,2:14 PM

## 2011-11-03 NOTE — Discharge Summary (Signed)
Physician Discharge Summary  Patient ID: Dean [name] MRN: [id number] DOB/AGE: 1924/10/01 78 y.o.  Admit date: 10/16/2011 Discharge date: 11/03/2011    Discharge Diagnoses:    1. MRSA pneumonia  2. Exudative pleural effusion  3. Dyspnea secondary to #1,2  4. Ileus  5. Constipation  6. Atrial fibrillation with RVR  7. Abdominal pain  8. Sepsis  9. Obesity 10. GERD (gastroesophageal reflux disease) 11. Hyponatremia 12. Urinary retention     Brief Summary: Dean BARTOL is a 78 y.o. y/o male with a PMH of PAF, aortic valve disease, asthma, gout, obesity, HTN, HLD, essential thrombocytopenia (followed by Dr. Bobby Rumpf) presented to [facility] on 1/2 and diagnosed with MRSA PNA.   Had progressive pleural effusion with thoracentesis on 1/8 showing exudative effusion.  Transferred to [facility] on 1/10 for further evaluation of worsening of Right sided airspace disease, increased work of breathing, AF w/ RVR and progressive leukocytosis.  1/10 CT evaluation demonstrated increase in R pleural effusion with mucus plugging.  1/13 underwent bronchoscopy in setting of respiratory distress for obstructed R main stem with bloody clot/pus.  1/14 required placement of chest tube for increased effusion / hydro-pneumo thorax.  1/14 noted to have Afib with RVR and placed on amiodarone.  1/17 patient underwent VATS, empyema drainage, right lung decortication. Returned to ICU on ventilator post-op.  1/23 Chest tubes removed and patient progressed with activity / diet.  He completed IV abx as below for PNA and possible colitis.  Unfortunately, secondary to severe critical illness and advanced age, he had severe deconditioning and is currently planned for CIR placement for Rehab.     Lines / Drains:  Rt chest tube 1/14 >>>VATS,drainage 1/17>>>1/23  Rt IJ 1/16>>>1/23  ETT 1/17> 1/18   Micro/Sepsis  c-diff pcr 1/10>>neg  MRSA pcr>> +ve  Sputum 1/7>>>MRSA  Influenza PCR1/2>>> Negative  BAL  1/13>>>MRSA  R Pleural 1/17 > MRSA   Antibiotics:  Vanco 1/5>>>1/13  Zosyn 1/10>>>1/13  Levaquin 1/2>>>1/10  Rocephin 1/2>>>1/10  primaxin 1/13>>>1/16  zyvox (MRSA PNA/ empyema)1/13>>> plan 3 weeks   Tests / Events:  1/10 CT Chest: increase in right pleural effusion, mucous plugging  1/10 CT abd: no evidence of bowel obstruction or ileus  1/8: thoracentesis on 1/8 yielding a exudative sample consistent with a parapneumonia effusion.  ECHO 10/17/2010: EF 55%, preserved systolic function. Mild aortic stenosis.  1/10- no distress  1/13 - resp distress, hypoxia, complete right lung collapse. S/p Bronchoscopy (DF)- obstructed rt main with blood clot mixed with pus.  1/14- prior exudate knonw, increased effusion, chest tube placed, hydro pneurmo improved  1/14 - CT chest - hydro PNA, PNA  1/14- amio strted  1/15- improved Rt lung aeration post Chest tube placement  1/16- cardizem drip stopped, breathing unchanged, gross tolerated neg balance, no distress  1/17- s/p VATS, empyema drainage, rt lung decortication. vent post op  1/23- chest tubes removed  1/24- Bm noted, no distress, rt base atx  1/25- no distress  1/28 - in floor bed. Off chest tube. Feels well. Eating solids     Discharge Exam: General: chronically ill, NAD Neuro: Generalized weakness/deconditioning, AAO CV: s1s2 rrr PULM: resp's even/non-labored, lungs bilaterally clear GI: round/soft, non-tender, bsx4 active Extremities: warm/dry, no edema    Discharge Labs  BMET  Lab 11/03/11 0844 11/01/11 0720 10/31/11 0425 10/30/11 0410 10/29/11 0520  NA 137 136 133* 133* 136  K 3.4* 3.7 -- -- --  CL 98 98 96 96 98  CO2 30  31 32 31 33*  GLUCOSE 105* 94 106* 119* 86  BUN 11 14 14 13 11   CREATININE 1.04 0.99 0.97 0.96 0.95  CALCIUM 8.2* 8.4 7.9* 8.3* 8.1*  MG -- -- -- -- 1.8  PHOS -- -- -- -- 3.5     CBC   Lab 11/03/11 0844 10/31/11 0425 10/30/11 0410  HGB 10.3* 10.1* 10.9*  HCT 31.8* 31.6* 33.2*  WBC 6.1  7.3 9.5  PLT 409* 415* 406*   Anti-Coagulation  Lab 11/03/11 0844 11/02/11 0500 11/01/11 0720 10/31/11 0425 10/30/11 0410  INR 1.35 1.57* 1.88* 2.16* 2.09*             [place], M[place]  Home Medication Instructions [id number]   Printed on:11/03/11 1329  Medication Information                    Tamsulosin HCl (FLOMAX) 0.4 MG CAPS Take 0.4 mg by mouth daily.           aspirin EC 81 MG tablet Take 81 mg by mouth daily.           fish oil-omega-3 fatty acids 1000 MG capsule Take 1 g by mouth daily.           acetaminophen (TYLENOL) 500 MG tablet Take 500 mg by mouth every other day.           mometasone (NASONEX) 50 MCG/ACT nasal spray Place 2 sprays into the nose daily.           azelastine (ASTELIN) 137 MCG/SPRAY nasal spray Place 1 spray into the nose 2 (two) times daily. Use in each nostril as directed           allopurinol (ZYLOPRIM) 300 MG tablet Take 300 mg by mouth daily.           warfarin (COUMADIN) 4 MG tablet Take 4 mg by mouth See admin instructions. 4mg  on Monday, Wednesday, Thursday, Saturday, Sunday           warfarin (COUMADIN) 5 MG tablet Take 5 mg by mouth See admin instructions. Tuesday, Friday           zolpidem (AMBIEN) 5 MG tablet Take 5 mg by mouth at bedtime as needed.           pregabalin (LYRICA) 50 MG capsule Take 50 mg by mouth daily as needed.           diclofenac sodium (VOLTAREN) 1 % GEL Apply 1 application topically daily as needed.           budesonide (PULMICORT) 0.25 MG/2ML nebulizer solution Take 0.25 mg by nebulization 2 (two) times daily.           albuterol (ACCUNEB) 1.25 MG/3ML nebulizer solution Take 1 ampule by nebulization 2 (two) times daily.           diltiazem (CARDIZEM CD) 180 MG 24 hr capsule Take 180 mg by mouth daily.           simvastatin (ZOCOR) 20 MG tablet Take 10 mg by mouth every evening.           hydroxyurea (HYDREA) 500 MG capsule Take 500 mg by mouth every other day. May take with food  to minimize GI side effects.             Disposition: Final discharge disposition not confirmed  Discharged Condition: [place] has met maximum benefit of inpatient care and is medically stable and cleared for discharge to [facility].  Patient is pending follow up as above.      Time spent on disposition:  Greater than 35 minutes.   Signed: Noe Gens, NP-C [facility] Pgr: [phone]

## 2011-11-03 NOTE — Progress Notes (Signed)
   CARE MANAGEMENT NOTE 11/03/2011  Patient:  Dean [name], Dean [name]   Account Number:  [id number]  Date Initiated:  10/17/2011  Documentation initiated by:  [facility]  Subjective/Objective Assessment:   Admitted with pneumonia and Afib with RVR from [place]. Has spouse     Action/Plan:   PTA, PT FAIRLY INDEPENDENT, LIVES WITH SPOUSE.  HAS SUPPORTIVE DAUGHTER.   Anticipated DC Date:  10/31/2011   Anticipated DC Plan:  IP REHAB FACILITY      DC Planning Services  CM consult      Choice offered to / List presented to:             Status of service:  Completed, signed off Medicare Important Message given?   (If response is "NO", the following Medicare IM given date fields will be blank) Date Medicare IM given:   Date Additional Medicare IM given:    Discharge Disposition:  IP REHAB FACILITY  Per UR Regulation:  Reviewed for med. necessity/level of care/duration of stay  Comments:  11/03/11 Dean Rawlinson,RN,BSN PT STABLE FOR DC TO [facility] TODAY. Phone [phone]   10/29/11 Dean Seyler,RN,BSN 1400 PT HAS BEEN EVALUATED BY REHAB PHYSICIAN AND IS APPROPRIATE FOR INPT REHAB STAY WHEN MEDICALLY STABLE.  HE HAS SUPPORTIVE FAMILY TO ASSIST AT DISCHARGE.  HOPEFUL FOR DC TO [facility] BY END OF WEEK, IF MEDICALLY ACCEPTABLE WITH MD. Phone [phone]   10-29-11 3:50pm Dean [name] - [phone] UR Completed.  10-24-11 [place] UR Completed. post op VATSon 10-23-11  10-21-11 3pm Dean [name], RNBSN [phone] UR Completed.  10-17-11 Dean [name] [id number] UR Completed.

## 2011-11-03 NOTE — PMR Pre-admission (Signed)
PMR Admission Coordinator Pre-Admission Assessment  Patient:  Dean [name] is an 78 y.o., male MRN:  [id number] DOB:  31-Oct-1923 Height:  6' (182.9 cm) Weight:  84.3 kg (185 lb 13.6 oz)  Insurance Information: HMO:    PPO:     PCP:     IPA:     80/20:yes     OTHER:no HMO PRIMARY:Medicare A and B      Policy#:[id number]      Subscriber:pt Benefits:  Phone #:visionshare     Name:10/27/11 automated Eff. Date:08/07/91     Deduct:$1184      Out of Pocket Max: none    Life [id number] CIR:100%      SNF:20 full days LBD 02/22/07 Outpatient:80%     Co-Pay:20% Home Health:100%      Co-Pay:none DME:80%     Co-Pay:20% Providers:pt choice  SECONDARY:[employer]      Policy#:[id number]      Subscriber:pt NO authorization needed with Medicare primary  Current Medical History:   Patient Admitting Diagnosis:  Deconditioning related to pneumonia and pleural effusions  History of Present Illness: Admitted 10/08/11 with MRSA pneumonia with progressive pleural effusion underwent thoracentesis on 10/14/11 and transferred to [facility] on 1/19 from outside hospital. Placed on antibiotics. Developed large right pleural effusion and underwent chest tube insertion on 10/20/11. Then underwent right VATS with drainage of empyema on 10/23/11. Chest tube removed 10/29/11. Now deconditioned.  Patients Past Medical History:   Past Medical History  Diagnosis Date  . PAF (paroxysmal atrial fibrillation)     followed by [place] cardiology  . Aortic valve disease     followed by [facility] cardiology  . Prostate cancer     s/p seed implants  . Environmental allergies   . Asthma   . Essential thrombocytopenia     followed by Dr Bobby Rumpf (hematology)  . Neuropathy   . Insomnia   . Gout   . Obesity   . Hypertension   . Hyperlipidemia   . Osteoarthritis   . Hypoxia, sleep related    Family Medical History:  family history includes Alzheimer's disease in his mother; Asthma in his maternal grandmother; Heart disease in his father; and  Tuberculosis in his maternal grandmother. Patients Current Diet: General Upgraded to a regular diet 11/02/11  Prior Rehab/Hospitalizations:home health only  Medications Current Medications: Current facility-administered medications:diltiazem (CARDIZEM CD) 24 hr capsule 180 mg, 180 mg, Oral, Daily, Brand Males, MD, 180 mg at 11/03/11 1002;  heparin injection 5,000 Units, 5,000 Units, Subcutaneous, Q8H, [name], MD, 5,000 Units at 11/03/11 0631;  levalbuterol [facility]) nebulizer solution 0.63 mg, 0.63 mg, Nebulization, Q6H, Cherene Altes, MD, 0.63 mg at 11/03/11 0823 linezolid (ZYVOX) IVPB 600 mg, 600 mg, Intravenous, Q12H, [name], MD, 600 mg at 11/03/11 1009;  ondansetron (ZOFRAN) injection 4 mg, 4 mg, Intravenous, Q6H PRN, Gaye Pollack, MD;  oxyCODONE-acetaminophen (PERCOCET) 5-325 MG per tablet 2 tablet, 2 tablet, Oral, Q4H PRN, [name], MD, 1 tablet at 11/02/11 2315;  pantoprazole (PROTONIX) EC tablet 40 mg, 40 mg, Oral, QHS, Jessica Owens [place], PHARMD, 40 mg at 11/02/11 2159 senna-docusate (Senokot-S) tablet 1 tablet, 1 tablet, Oral, QHS PRN, Gaye Pollack, MD;  Tamsulosin HCl (FLOMAX) capsule 0.4 mg, 0.4 mg, Oral, Daily, Brand Males, MD, 0.4 mg at 11/03/11 1002;  DISCONTD: 0.9 %  sodium chloride infusion, , Intravenous, Continuous, Lester [place], MD, Last Rate: 20 mL/hr at 11/01/11 1031 DISCONTD: antiseptic oral rinse (BIOTENE) solution 15 mL, 15 mL, Mouth Rinse, q12n4p,  Doree Fudge, MD, 15 mL at 11/02/11 1135;  DISCONTD: bisacodyl (DULCOLAX) EC tablet 10 mg, 10 mg, Oral, Daily, Gaye Pollack, MD, 10 mg at 11/02/11 1004;  DISCONTD: chlorhexidine (PERIDEX) 0.12 % solution 15 mL, 15 mL, Mouth Rinse, BID, Doree Fudge, MD, 15 mL at 11/02/11 0807 DISCONTD: furosemide (LASIX) injection 20 mg, 20 mg, Intravenous, Daily, [name], MD, 20 mg at 11/02/11 1005;  DISCONTD: insulin aspart (novoLOG) injection 0-9 Units, 0-9  Units, Subcutaneous, Q4H, Cherene Altes, MD, 1 Units at 11/02/11 1148  Precautions/Special Needs:    Additional Precautions/Restrictions: Precautions Precautions: Fall (Orange Contact) Required Braces or Orthoses: No Restrictions Weight Bearing Restrictions: No MRSA pneumonia  Therapy Assessments Prior Function Level of Independence: Independent with basic ADLs;Independent with homemaking with ambulation;Independent with gait;Independent with transfers Able to Take Stairs?: Reciprically Driving: Yes   Restrictions Weight Bearing Restrictions: No   SLP Recommendations: Equipment Recommended: Defer to next venue   Prior Function: Level of Independence: Independent with basic ADLs;Independent with homemaking with ambulation;Independent with gait;Independent with transfers Able to Take Stairs?: Reciprically Driving: Yes    Additional Prior Functional Levels:  Bed Mobility: Independent PTA Driving: yes  ADLs/Mobility:Current Functional Progress    Bed Mobility Bed Mobility: Yes Rolling Right: 3: Mod assist;With rail Rolling Right Details (indicate cue type and reason): cues for encouragement and sequencing Rolling Left: 3: Mod assist;With rail Right Sidelying to Sit: 3: Mod assist;HOB elevated (comment degrees) (HOB ~30degrees) Right Sidelying to Sit Details (indicate cue type and reason): cues for sequencing and encouragement Sitting - Scoot to Edge of Bed: 2: Max assist Transfers Transfers: Yes Sit to Stand: 1: +2 Total assist;Patient percentage (comment);With upper extremity assist;From chair/3-in-1 (pt 60%) Sit to Stand Details (indicate cue type and reason): pt nedds cues and A for anterior wt shift Stand to Sit: 1: +2 Total assist;Patient percentage (comment);With upper extremity assist;To chair/3-in-1 (pt 60%) Stand to Sit Details: pt needed increased A to control descent Stand Pivot Transfers: 1: +2 Total assist;Patient percentage (comment) (pt 60%) Stand Pivot  Transfer Details (indicate cue type and reason): cues for use of UEs and use of RW Ambulation/Gait Ambulation/Gait: No Ambulation/Gait Assistance: 1: +2 Total assist;Patient percentage (comment) (pt 80%) Ambulation/Gait Assistance Details (indicate cue type and reason): Pivotal steps only.  pt fatigued, but eager for OOB.   Ambulation Distance (Feet):  (pivotal steps only.  ) Assistive device: Rolling walker Gait Pattern: Step-through pattern;Decreased stride length;Trunk flexed Stairs: No Wheelchair Mobility Wheelchair Mobility: No Posture/Postural Control Posture/Postural Control: No significant limitations Balance Balance Assessed: No  Home Assistive Devices/Equipment:  Home Assistive Devices/Equipment Home Assistive Devices/Equipment: Other (Comment) (wears O2 at 2 liters at HS)  Discharge Planning:  Living Arrangements: Spouse/significant other Support Systems: Spouse/significant other Do you have any problems obtaining your medications?: No Type of Residence: Private residence Home Care Services: No Patient expects to be discharged to::  (home) Expected Discharge Date:  (ELOS 2 weeks) Case Management Consult Needed: Yes (Comment)  Previous Home Environment:  Living Arrangements: Spouse/significant other Support Systems: Spouse/significant other Do you have any problems obtaining your medications?: No Type of Residence: Private residence [place]: No Patient expects to be discharged to::  (home) Expected Discharge Date:  (ELOS 2 weeks) Home Environment Number of Levels: one level Previous Home Environment Number of Steps: one Previous Home Environment Is Bedroom on Main Floor?: Yes Previous Home Environment Is Bathroom on Main Floor?: Yes  Discharge Living Setting:  Plans for Discharge Living Setting: Patient's home;Lives with (  comment) (spouse and daughter) Discharge Living Setting Number of Levels: one level Discharge Living Setting Number of Steps:  one Discharge Living Setting is Bedroom on Main Floor?: Yes Discharge Living Setting is Bathroom on Main Floor?: Yes  Social/Family/Support Systems:  Patient Roles: Spouse;Parent Contact Information: Sherrie Mustache, daughter; Raiden Hammann, wife Anticipated Caregiver: wife and daughter Anticipated Caregiver's Contact Information: Freda Munro is [phone]; Pamala Hurry is [phone] Ability/Limitations of Caregiver: supervision level Caregiver Availability: 24/7 Discharge Plan Discussed with Primary Caregiver: Yes Is Caregiver In Agreement with Plan?: Yes Does Caregiver/Family have Issues with Lodging/Transportation while Pt is in Rehab?: No  Goals/Additional Needs:  Patient/Family Goal for Rehab: supervision to Mod I with PT and OT Pt/Family Agrees to Admission and willing to participate: Yes Program Orientation Provided & Reviewed with Pt/Caregiver Including Roles  & Responsibilities: Yes  Preadmission Screen Completed By:  Cleatrice Burke, 11/03/2011 11:03 AM  Patient's condition:  Please see physician update to information in consult dated 10/27/11.  Preadmission Screen Competed by:Danne Baxter, RN, Time/Date, [phone] on 11/03/11.  Discussed status with Dr. Naaman Plummer on1/28/13 at  1142 and received telephone approval for admission today.  Admission Coordinator:  Cleatrice Burke, time [id number] Date 11/03/11.

## 2011-11-03 NOTE — Progress Notes (Signed)
Physical Therapy Treatment Patient Details Name: Dean [name] MRN: [id number] DOB: 1924-09-18 Today's Date: 11/03/2011  PT Assessment/Plan  PT - Assessment/Plan Comments on Treatment Session: Did great today. Very motivated for therapy and agreeable for participation.  PT Plan: Discharge plan remains appropriate;Frequency remains appropriate PT Goals  Acute Rehab PT Goals PT Goal: Supine/Side to Sit - Progress: Not met PT Goal: Sit to Supine/Side - Progress: Not met PT Goal: Sit to Stand - Progress: Progressing toward goal PT Goal: Ambulate - Progress: Progressing toward goal PT Goal: Up/Down Stairs - Progress: Not met  PT Treatment Precautions/Restrictions  Precautions Precautions: Fall (orange contact) Required Braces or Orthoses: No Restrictions Weight Bearing Restrictions: No Mobility (including Balance) Bed Mobility Bed Mobility: No (pt sitting EOB on arrival) Transfers Sit to Stand: 4: Min assist;From chair/3-in-1;From bed;With armrests;With upper extremity assist Sit to Stand Details (indicate cue type and reason): facilitation for anterior translation and follow through to stand; took him 2 attempts to stand from lower chair needing facilitation at sacrum for follow through; cues for safe hand placement Stand to Sit: 4: Min assist;To chair/3-in-1;With armrests Ambulation/Gait Ambulation/Gait: Yes Ambulation/Gait Assistance: 4: Min assist Ambulation/Gait Assistance Details (indicate cue type and reason): amb approx 40 ft with RW; cues for upright posture and forward gaze; pt amb with shuffled feet; pt easily fatigued Ambulation Distance (Feet): 40 Feet Assistive device: Rolling walker Gait Pattern: Trunk flexed;Shuffle Stairs: No  Posture/Postural Control Posture/Postural Control: No significant limitations Balance Balance Assessed: Yes Dynamic Standing Balance Dynamic Standing - Balance Support: No upper extremity supported (pt reaching for UE support but weight  shifting done w/o UE ) Dynamic Standing - Level of Assistance: 4: Min assist;3: Mod assist Dynamic Standing - Balance Activities: Lateral lean/weight shifting;Forward lean/weight shifting Exercise  Total Joint Exercises Ankle Circles/Pumps: AROM;Both;10 reps;Seated Long Arc Quad: AROM;Both;10 reps;Seated End of Session PT - End of Session Equipment Utilized During Treatment: Gait belt Activity Tolerance: Patient tolerated treatment well;Patient limited by fatigue Patient left: in chair Nurse Communication: Mobility status for transfers;Mobility status for ambulation General Behavior During Session: Dean [name] for tasks performed Cognition: [name] for tasks performed  Dean [name] 11/03/2011, 12:39 PM

## 2011-11-03 NOTE — Progress Notes (Signed)
Name: Dean [name] MRN: [id number] DOB: 09/27/1924    LOS: 32  [facility] PCCM Progress Note  Brief patient profile:  87 yowm to [facility] on 1/2. Dx eval demonstrated MRSA PNA.Had progressive pleural effusion with thoracentesis on 1/8 showing exudative  > to [facility] on 1/10 for further evaluation of  worsening of Right sided airspace disease, increased work of breathing, AF w/ RVR and progressive leukocytosis.   Lines / Drains: Rt chest tube 1/14 >>>VATS,drainage 1/17>>>1/23 Rt IJ 1/16>>>1/23 ETT 1/17> 1/18   Micro/Sepsis c-diff pcr 1/10>>neg  MRSA pcr>> +ve Sputum 1/7>>>MRSA  Influenza PCR1/2>>> Negative BAL 1/13>>>MRSA R Pleural 1/17 >  MRSA   Antibiotics: Vanco 1/5>>>1/13 Zosyn 1/10>>>1/13 Levaquin 1/2>>>1/10 Rocephin 1/2>>>1/10  primaxin 1/13>>>1/16 zyvox (MRSA PNA/ empyema)1/13>>> plan 3 weeks   Tests / Events: 1/10 CT Chest: increase in right pleural effusion, mucous plugging 1/10 CT abd: no evidence of bowel obstruction or ileus 1/8: thoracentesis on 1/8 yielding a exudative sample consistent with a parapneumonia effusion.   ECHO 10/17/2010: EF 55%, preserved systolic function. Mild aortic stenosis.  1/10- no distress 1/13 - resp distress, hypoxia, complete right lung collapse. S/p Bronchoscopy (DF)- obstructed rt main with blood clot mixed with pus.  1/14- prior exudate knonw, increased effusion, chest tube placed, hydro pneurmo improved 1/14 - CT chest - hydro PNA, PNA 1/14- amio strted 1/15- improved Rt lung aeration post Chest tube placement 1/16- cardizem drip stopped, breathing unchanged, gross tolerated neg balance, no distress 1/17- s/p VATS, empyema drainage, rt lung decortication.  vent post op 1/23- chest tubes removed 1/24- Bm noted, no distress, rt base atx 1/25- no distress 1/28 - in floor bed. Off chest tube. Feels well. Eating solids  Subjective/Overnight event:   pt did well overnight with no increased wob.  Using IS at bedside                                                                                 Vital Signs: Temp:  [98 F (36.7 C)-98.5 F (36.9 C)] 98.1 F (36.7 C) (01/28 0556) Pulse Rate:  [80-100] 81  (01/28 0556) Resp:  [18-19] 18  (01/28 0556) BP: (102-112)/(58-66) 102/65 mmHg (01/28 0556) SpO2:  [94 %-97 %] 95 % (01/28 0823)    Physical Examination: BP 102/65  Pulse 81  Temp(Src) 98.1 F (36.7 C) (Oral)  Resp 18  Ht 6' (1.829 m)  Wt 84.3 kg (185 lb 13.6 oz)  BMI 25.21 kg/m2  SpO2 95%  General Appearance:    Alert, cooperative, no distress, appears stated age. Chronically unwell looking. DECONDITIONED  Head:    Normocephalic, without obvious abnormality, atraumatic  Eyes:    PERRL, conjunctiva/corneas clear, EOM's intact, fundi    benign, both eyes       Ears:    Normal TM's and external ear canals, both ears  Nose:   Nares normal, septum midline, mucosa normal, no drainage   or sinus tenderness  Throat:   Lips, mucosa, and tongue normal; teeth and gums normal  Neck:   Supple, symmetrical, trachea midline, no adenopathy;       thyroid:  No enlargement/tenderness/nodules; no carotid   bruit or JVD  Back:  Symmetric, no curvature, ROM normal, no CVA tenderness  Lungs:     Clear to auscultation bilaterally, respirations unlabored  Chest wall:    No tenderness or deformity  Heart:    Regular rate and rhythm, S1 and S2 normal, no murmur, rub   or gallop  Abdomen:     Soft, non-tender, bowel sounds active all four quadrants,    no masses, no organomegaly  Genitalia:    Normal male without lesion, discharge or tenderness  Rectal:    Normal tone, normal prostate, no masses or tenderness;   guaiac negative stool  Extremities:   Extremities normal, atraumatic, no cyanosis but has ++ EDEMA  Pulses:   2+ and symmetric all extremities  Skin:   Skin color, texture, turgor normal, no rashes or lesions  Lymph nodes:   Cervical, supraclavicular, and axillary nodes normal  Neurologic:   CNII-XII intact.  Normal strength, sensation and reflexes      throughout       Labs   Lab 11/01/11 0720 10/31/11 0425 10/30/11 0410  NA 136 133* 133*  K 3.7 3.7 3.5  CL 98 96 96  CO2 31 32 31  BUN 14 14 13   CREATININE 0.99 0.97 0.96  GLUCOSE 94 106* 119*    Lab 10/31/11 0425 10/30/11 0410 10/28/11 0540  HGB 10.1* 10.9* 11.1*  HCT 31.6* 33.2* 34.2*  WBC 7.3 9.5 7.0  PLT 415* 406* 374         Assessment and Plan:  MRSA PNA,  Empyema, Debility, 3rd spacing/edema Pt is slowly improving.  Chest tubes are out, and has no increased wob.But is very deconditioned and debility + (Baseline was very functional). Also with 3rd spacing edema +  PLAN  - saline lock IV  - Continue linezolid  - PT/OT/Mobilize  -Call inpatient rehab consult - solid diet  Dr. Brand Males, M.D., [facility].C.P Pulmonary and Critical Care Medicine Staff Physician [facility] Pager: [phone], If no answer or between  15:00h - 7:00h: call [phone]  11/03/2011 8:52 AM

## 2011-11-03 NOTE — Progress Notes (Signed)
I met with patient at bedside. Patient is in agreement to admit to inpatient acute rehabilitation and bed is available today. I contacted Dr. Chase Caller and he is in agreement to discharge patient to inpatient acute rehabilitation today. Please call me with any questions. Pager [phone]

## 2011-11-03 NOTE — H&P (Signed)
Physical Medicine and Rehabilitation Admission H&P     Chief complaint: Deconditioned  HPI : [name] is an 78 year old white right-handed male initially admitted to [facility] on January 2 and diagnosed with MRSA pneumonia with progressive pleural effusion. He underwent thoracentesis on January 8 and ultimately transferred to [facility] on January 10 for treatment of worsening right sided airspace disease, increased WOB, afib with RVR and progressive leucocytosis. Treated with cardizem and amiodarone  IV and continued on IV antibiotics. .Patient been on chronic Coumadin therapy prior to admission for atrial fibrillation. Echocardiogram on January 12 with ejection fraction [id number] preserved systolic function mild aortic stenosis. Patient developed large right pleural effusion with complete right lung collapse and underwent chest tube insertion by Dr. [name] on January 14. Placed on linazoid and impenem for sepsis due to PNA and possible colitis. Required BIPAP for respiratory support. Patient with persistent moderate to large loculated pleural fluid collection suggesting empyema and required right VATS with decortication and drainage of empyema on January 17 per Dr. Cyndia Bent.  Chest tubes d/c 01/22. Post procedure with ileus treated and diet slowly advanced. Fluid overload treated with diuretics. MRSA PNA/empyema to be treated with three weeks of zyxox.   Review of Systems  HENT: Positive for hearing loss and neck pain.   Eyes: Negative for blurred vision and double vision.  Respiratory: Positive for cough and shortness of breath.   Cardiovascular: Positive for chest pain (right chest wall with deep breaths) and leg swelling.  Gastrointestinal: Negative for abdominal pain and constipation.  Genitourinary:       Hesitancy.  Musculoskeletal: Positive for joint pain.  Neurological: Negative for tingling.  Psychiatric/Behavioral: Negative for depression. The patient is not nervous/anxious.    Past  Medical History  Diagnosis Date  . PAF (paroxysmal atrial fibrillation)     followed by [place] cardiology  . Aortic valve disease     followed by [facility] cardiology  . Prostate cancer     s/p seed implants  . Environmental allergies   . Asthma   . Essential thrombocytopenia     followed by Dr Bobby Rumpf (hematology)  . Neuropathy   . Insomnia   . Gout   . Obesity   . Hypertension   . Hyperlipidemia   . Osteoarthritis   . Hypoxia, sleep related    Surgical history:  Lap chole  Exc skin CA-BCC  Excision cataracts with IOL implant  T and A  Exc mass from back.  Exc nasal polyps       Family History  Problem Relation Age of Onset  . Asthma Maternal Grandmother   . Tuberculosis Maternal Grandmother   . Heart disease Father     died of heart disease  . Alzheimer's disease Mother    Social History:  Married. Retired Dealer.  He reports that he has never smoked. He does not have any smokeless tobacco history on file. His alcohol and drug histories not on file. Wife in good health and can provide assistance past discharge.   Allergies: No Known Allergies  Hospital Medications:    . diltiazem  180 mg Oral Daily  . heparin subcutaneous  5,000 Units Subcutaneous Q8H  . levalbuterol  0.63 mg Nebulization Q6H  . linezolid  600 mg Intravenous Q12H  . pantoprazole  40 mg Oral QHS  . Tamsulosin HCl  0.4 mg Oral Daily  . DISCONTD: antiseptic oral rinse  15 mL Mouth Rinse q12n4p  . DISCONTD: bisacodyl  10 mg Oral Daily  .  DISCONTD: chlorhexidine  15 mL Mouth Rinse BID  . DISCONTD: furosemide  20 mg Intravenous Daily  . DISCONTD: insulin aspart  0-9 Units Subcutaneous Q4H   Medications Prior to Admission  Medication Sig Dispense Refill  . albuterol (ACCUNEB) 1.25 MG/3ML nebulizer solution Take 1 ampule by nebulization 2 (two) times daily.      Marland Kitchen allopurinol (ZYLOPRIM) 300 MG tablet Take 300 mg by mouth daily.      . budesonide (PULMICORT) 0.25 MG/2ML nebulizer solution Take  0.25 mg by nebulization 2 (two) times daily.      . diclofenac sodium (VOLTAREN) 1 % GEL Apply 1 application topically daily as needed.      . diltiazem (CARDIZEM CD) 180 MG 24 hr capsule Take 180 mg by mouth daily.      . hydroxyurea (HYDREA) 500 MG capsule Take 500 mg by mouth every other day. May take with food to minimize GI side effects.      . pregabalin (LYRICA) 50 MG capsule Take 50 mg by mouth every other day      . simvastatin (ZOCOR) 20 MG tablet Take 10 mg by mouth every evening.      . warfarin (COUMADIN) 4 MG tablet Take 4 mg by mouth See admin instructions. 4mg  on Monday, Wednesday, Thursday, Saturday, Sunday      . warfarin (COUMADIN) 5 MG tablet Take 5 mg by mouth See admin instructions. Tuesday, Friday      . zolpidem (AMBIEN) 5 MG tablet Take 5 mg by mouth at bedtime as needed.        Home: Home Living Lives With: Spouse;Daughter Receives Help From: Family Type of Home: House Home Layout: One level (with basement) Home Access: Stairs to enter Entrance Stairs-Rails: None Entrance Stairs-Number of Steps: 1 Home Adaptive Equipment: None   Functional History: Prior Function Level of Independence: Independent with basic ADLs;Independent with homemaking with ambulation;Independent with gait;Independent with transfers Able to Take Stairs?: Reciprically Driving: Yes  Functional Status:  Mobility: Bed Mobility Bed Mobility: Yes Rolling Right: 3: Mod assist;With rail Rolling Right Details (indicate cue type and reason): cues for encouragement and sequencing Rolling Left: 3: Mod assist;With rail Right Sidelying to Sit: 3: Mod assist;HOB elevated (comment degrees) (HOB ~30degrees) Right Sidelying to Sit Details (indicate cue type and reason): cues for sequencing and encouragement Sitting - Scoot to Edge of Bed: 2: Max assist Transfers Transfers: Yes Sit to Stand: 1: +2 Total assist;Patient percentage (comment);With upper extremity assist;From chair/3-in-1 (pt 60%) Sit  to Stand Details (indicate cue type and reason): pt nedds cues and A for anterior wt shift Stand to Sit: 1: +2 Total assist;Patient percentage (comment);With upper extremity assist;To chair/3-in-1 (pt 60%) Stand to Sit Details: pt needed increased A to control descent Stand Pivot Transfers: 1: +2 Total assist;Patient percentage (comment) (pt 60%) Stand Pivot Transfer Details (indicate cue type and reason): cues for use of UEs and use of RW Ambulation/Gait Ambulation/Gait: No Ambulation/Gait Assistance: 1: +2 Total assist;Patient percentage (comment) (pt 80%) Ambulation/Gait Assistance Details (indicate cue type and reason): Pivotal steps only.  pt fatigued, but eager for OOB.   Ambulation Distance (Feet):  (pivotal steps only.  ) Assistive device: Rolling walker Gait Pattern: Step-through pattern;Decreased stride length;Trunk flexed Stairs: No Wheelchair Mobility Wheelchair Mobility: No  ADL:    Cognition: Cognition Orientation Level: Oriented X4 Cognition Orientation Level: Oriented X4   Blood pressure 102/65, pulse 81, temperature 98.1 F (36.7 C), temperature source Oral, resp. rate 18, height 6' (1.829 m), weight  84.3 kg (185 lb 13.6 oz), SpO2 95.00%. Physical Exam  Constitutional: He is oriented to person, place, and time. He appears well-developed and well-nourished.  HENT:  Head: Normocephalic and atraumatic.  Eyes: Pupils are equal, round, and reactive to light.  Neck: Normal range of motion. Neck supple.  Cardiovascular: Normal rate and regular rhythm.   Pulmonary/Chest: Effort normal.       Decreased RLL and RML.  Abdominal: Soft. Bowel sounds are normal.       Abdomen with bowel sounds positive but distended.  Musculoskeletal: Normal range of motion.       1+ edema BLE >BUE with tenting.  Lymphadenopathy:       Lower extremities with 1-2+ pitting edema.  Neurological: He is alert and oriented to person, place, and time.       He is hard of hearing otherwise  cranial nerves appear grossly intact. Strength in the upper extremities grossly 4/5 proximal to 4+ out of 5 distally. Lower extremity strength grossly 2-5-2+ with hip flexion 3/5 knee extension 4/5 ankle dorsiflexion and plantarflexion today. Reflexes 1+. Sensory exam grossly intact in all 4 limbs perhaps with some diminishment due to edema in the legs.  Skin: Skin is warm and dry.    Results for orders placed during the hospital encounter of 10/16/11 (from the past 48 hour(s))  GLUCOSE, CAPILLARY     Status: Abnormal   Collection Time   11/01/11  4:18 PM      Component Value Range Comment   Glucose-Capillary 113 (*) 70 - 99 (mg/dL)   GLUCOSE, CAPILLARY     Status: Abnormal   Collection Time   11/01/11  9:16 PM      Component Value Range Comment   Glucose-Capillary 111 (*) 70 - 99 (mg/dL)   GLUCOSE, CAPILLARY     Status: Abnormal   Collection Time   11/01/11 11:56 PM      Component Value Range Comment   Glucose-Capillary 136 (*) 70 - 99 (mg/dL)    Comment 1 Documented in Chart      Comment 2 Notify RN     PROTIME-INR     Status: Abnormal   Collection Time   11/02/11  5:00 AM      Component Value Range Comment   Prothrombin Time 19.1 (*) 11.6 - 15.2 (seconds)    INR 1.57 (*) 0.00 - 1.49    GLUCOSE, CAPILLARY     Status: Normal   Collection Time   11/02/11  5:29 AM      Component Value Range Comment   Glucose-Capillary 99  70 - 99 (mg/dL)   GLUCOSE, CAPILLARY     Status: Abnormal   Collection Time   11/02/11  9:16 AM      Component Value Range Comment   Glucose-Capillary 122 (*) 70 - 99 (mg/dL)   GLUCOSE, CAPILLARY     Status: Abnormal   Collection Time   11/02/11 11:20 AM      Component Value Range Comment   Glucose-Capillary 132 (*) 70 - 99 (mg/dL)   PROTIME-INR     Status: Abnormal   Collection Time   11/03/11  8:44 AM      Component Value Range Comment   Prothrombin Time 16.9 (*) 11.6 - 15.2 (seconds)    INR 1.35  0.00 - [id number]    BASIC METABOLIC PANEL     Status: Abnormal    Collection Time   11/03/11  8:44 AM      Component Value Range  Comment   Sodium 137  135 - 145 (mEq/L)    Potassium 3.4 (*) 3.5 - 5.1 (mEq/L)    Chloride 98  96 - 112 (mEq/L)    CO2 30  19 - 32 (mEq/L)    Glucose, Bld 105 (*) 70 - 99 (mg/dL)    BUN 11  6 - 23 (mg/dL)    Creatinine, Ser 1.04  0.50 - 1.35 (mg/dL)    Calcium 8.2 (*) 8.4 - 10.5 (mg/dL)    GFR calc non Af Amer 63 (*) >90 (mL/min)    GFR calc Af Amer 73 (*) >90 (mL/min)   CBC     Status: Abnormal   Collection Time   11/03/11  8:44 AM      Component Value Range Comment   WBC 6.1  4.0 - 10.5 (K/uL)    RBC 3.27 (*) 4.22 - 5.81 (MIL/uL)    Hemoglobin 10.3 (*) 13.0 - 17.0 (g/dL)    HCT 31.8 (*) 39.0 - 52.0 (%)    MCV 97.2  78.0 - 100.0 (fL)    MCH 31.5  26.0 - 34.0 (pg)    MCHC 32.4  30.0 - 36.0 (g/dL)    RDW 14.9  11.5 - 15.5 (%)    Platelets 409 (*) 150 - 400 (K/uL)    No results found.  Post Admission Physician Evaluation: 1. Functional deficits secondary  to MRSA pneumonia/empyema status post VATS with subsequent deconditioning. 2. Patient is admitted to receive collaborative, interdisciplinary care between the physiatrist, rehab nursing staff, and therapy team. 3. Patient's level of medical complexity and substantial therapy needs in context of that medical necessity cannot be provided at a lesser intensity of care such as a SNF. 4. Patient has experienced substantial functional loss from his/her baseline which was documented above under the "Functional History" and "Functional Status" headings.  Judging by the patient's diagnosis, physical exam, and functional history, the patient has potential for functional progress which will result in measurable gains while on inpatient rehab.  These gains will be of substantial and practical use upon discharge  in facilitating mobility and self-care at the household level. 5. Physiatrist will provide 24 hour management of medical needs as well as oversight of the therapy  plan/treatment and provide guidance as appropriate regarding the interaction of the two. 6. 24 hour rehab nursing will assist with bladder management, bowel management, safety, skin/wound care, disease management, medication administration, pain management and patient education  and help integrate therapy concepts, techniques,education, etc. 7. PT will assess and treat for:  Lower extremity strength, gait, balance, cardiopulmonary and muscular endurance, adaptive equipment and safety.  Goals are: Supervision to modified independent. 8. OT will assess and treat for: Upper extremity strength, pain management, ADLs, functional mobility, adaptive equipment.   Goals are: Modified independent to supervision. 9. SLP will assess and treat for:  10. Case Management and Social Worker will assess and treat for psychological issues and discharge planning. 11. Team conference will be held weekly to assess progress toward goals and to determine barriers to discharge. 12.  Patient will receive at least 3 hours of therapy per day at least 5 days per week. 13. ELOS and Prognosis: 10-14 days excellent   Medical Problem List and Plan: 1. DVT Prophylaxis/Anticoagulation: Pharmaceutical: Coumdin . Lovenox till INR therapeutic.  2. Pain Management: Prn percocet for chest wall/rib pain. Can also utilize ice and/or heat. Consider Lidoderm patches  3. Mood: monitor for now.    4. MRSA empyema/PNA:  Continue zyvox  thorough 02/3. Contact precautions  5. Afib with RVR:  Monitor HR with bid checks. Continue cardizem-off amiodarone.  6. Fluid overload with third spacing:  Low salt diet. Off diuretics.  Check daily weights.  7. Hypokalemia: Likely due to diuretics. Continue supplement and monitor  routinely. Need to keep supplemented to avoid arrthymias as well as recurrence of ileus.  8. Essential thrombocytopenia: resume hydrea.  9. Gout:  Resume allopurinol.  9. Cervicalgia: uses Voltaren gel prn.  10. Asthma  with nocturnal hypoxia:  Continue nebs qid.  Oxygen per [place] to keep sats >90%  Oval Linsey, MD 11/03/11

## 2011-11-04 DIAGNOSIS — R5381 Other malaise: Secondary | ICD-10-CM

## 2011-11-04 DIAGNOSIS — Z5189 Encounter for other specified aftercare: Secondary | ICD-10-CM

## 2011-11-04 DIAGNOSIS — J860 Pyothorax with fistula: Secondary | ICD-10-CM

## 2011-11-04 DIAGNOSIS — J159 Unspecified bacterial pneumonia: Secondary | ICD-10-CM

## 2011-11-04 LAB — PROTIME-INR
INR: 1.25 (ref 0.00–1.49)
Prothrombin Time: 16 seconds — ABNORMAL HIGH (ref 11.6–15.2)

## 2011-11-04 LAB — CBC
HCT: 30.5 % — ABNORMAL LOW (ref 39.0–52.0)
Hemoglobin: 9.8 g/dL — ABNORMAL LOW (ref 13.0–17.0)
MCH: 31.5 pg (ref 26.0–34.0)
MCHC: 32.1 g/dL (ref 30.0–36.0)

## 2011-11-04 LAB — COMPREHENSIVE METABOLIC PANEL
BUN: 13 mg/dL (ref 6–23)
Calcium: 8.4 mg/dL (ref 8.4–10.5)
GFR calc Af Amer: 86 mL/min — ABNORMAL LOW (ref >90)
Glucose, Bld: 105 mg/dL — ABNORMAL HIGH (ref 70–99)
Sodium: 139 mEq/L (ref 135–145)
Total Protein: 6.1 g/dL (ref 6.0–8.3)

## 2011-11-04 LAB — DIFFERENTIAL
Lymphocytes Relative: 29 % (ref 12–46)
Lymphs Abs: 1.5 10*3/uL (ref 0.7–4.0)
Neutrophils Relative %: 50 % (ref 43–77)

## 2011-11-04 MED ORDER — WARFARIN SODIUM 5 MG PO TABS
5.0000 mg | ORAL_TABLET | Freq: Once | ORAL | Status: AC
  Administered 2011-11-04: 5 mg via ORAL
  Filled 2011-11-04: qty 1

## 2011-11-04 NOTE — Progress Notes (Signed)
Patient ID: Dean [name], male   DOB: 04-15-1924, 78 y.o.   MRN: [id number] Subjective/Complaints: Review of Systems  Constitutional: Positive for malaise/fatigue.  Respiratory: Positive for cough.   Psychiatric/Behavioral: The patient has insomnia.   All other systems reviewed and are negative.  some difficulties with sleep due to cough 1/29    Objective: Vital Signs: Blood pressure 111/61, pulse 90, temperature 98.8 F (37.1 C), temperature source Oral, resp. rate 19, SpO2 96.00%. No results found.  Basename 11/04/11 0635 11/03/11 0844  WBC 5.3 6.1  HGB 9.8* 10.3*  HCT 30.5* 31.8*  PLT 393 409*    Basename 11/04/11 0635 11/03/11 0844  NA 139 137  K 3.5 3.4*  CL 101 98  CO2 31 30  GLUCOSE 105* 105*  BUN 13 11  CREATININE 0.94 1.04  CALCIUM 8.4 8.2*   CBG (last 3)   Basename 11/02/11 1120 11/02/11 0916 11/02/11 0529  GLUCAP 132* 122* 99    Wt Readings from Last 3 Encounters:  10/31/11 84.3 kg (185 lb 13.6 oz)  10/31/11 84.3 kg (185 lb 13.6 oz)    Physical Exam:  General appearance: alert, cooperative and mild distress Head: Normocephalic, without obvious abnormality, atraumatic Eyes: conjunctivae/corneas clear. PERRL, EOM's intact. Fundi benign. Ears: normal TM's and external ear canals both ears Nose: Nares normal. Septum midline. Mucosa normal. No drainage or sinus tenderness. Throat: lips, mucosa, and tongue normal; teeth and gums normal Neck: no adenopathy, no carotid bruit, no JVD, supple, symmetrical, trachea midline and thyroid not enlarged, symmetric, no tenderness/mass/nodules Back: symmetric, no curvature. ROM normal. No CVA tenderness. Resp: diminished breath sounds RLL Cardio: regular rate and rhythm, S1, S2 normal, no murmur, click, rub or gallop GI: soft, non-tender; bowel sounds normal; no masses,  no organomegaly Extremities: extremities normal, atraumatic, no cyanosis or edema except legs 1+ bilaterally Pulses: 2+ and symmetric Skin: Skin  color, texture, turgor normal. No rashes or lesions Neurologic: Grossly normal Incision/Wound: chest wounds clean and intact Exam 1/29  Assessment/Plan: 1. Functional deficits secondary to deconditioning from empyema/pna--s/p VATS which require 3+ hours per day of interdisciplinary therapy in a comprehensive inpatient rehab setting. Physiatrist is providing close team supervision and 24 hour management of active medical problems listed below. Physiatrist and rehab team continue to assess barriers to discharge/monitor patient progress toward functional and medical goals. FIM:                   Comprehension Comprehension Mode: Auditory Comprehension: 5-Understands basic 90% of the time/requires cueing < 10% of the time  Expression Expression Mode: Verbal Expression: 5-Expresses basic 90% of the time/requires cueing < 10% of the time.  Social Interaction Social Interaction: 5-Interacts appropriately 90% of the time - Needs monitoring or encouragement for participation or interaction.  Problem Solving Problem Solving: 6-Solves complex problems: With extra time  Memory Memory: 6-More than reasonable amt of time  1. DVT Prophylaxis/Anticoagulation: Pharmaceutical: Coumdin . Lovenox till INR therapeutic.  2. Pain Management: Prn percocet for chest wall/rib pain. Can also utilize ice and/or heat. Consider Lidoderm patches. Denies significant pain at present.  3. Mood: monitor for now.  4. MRSA empyema/PNA: Continue zyvox thorough 02/3. Contact precautions. Guaifenesin for cough 5. Afib with RVR: Monitor HR with bid checks. Continue cardizem-off amiodarone. HR controlled 6. Fluid overload with third spacing: Low salt diet. Off diuretics. Check daily weights.  7. Hypokalemia: Likely due to diuretics. Continue supplement and monitor routinely. Need to keep supplemented to avoid arrthymias as well as recurrence  of ileus. Has good appetite. 8. Essential thrombocytopenia: resume  hydrea.  9. Gout: Resume allopurinol.  9. Cervicalgia: uses Voltaren gel prn.  10. Asthma with nocturnal hypoxia: Continue nebs qid. Oxygen per [place] to keep sats >90%    LOS (Days) 1 A FACE TO FACE EVALUATION WAS PERFORMED  Dean [name] 11/04/2011, 7:50 AM

## 2011-11-04 NOTE — Progress Notes (Signed)
Recreational Therapy Assessment and Plan  Patient Details  Name: Dean [name] MRN: [id number] Date of Birth: [birth year]  Met with pt and his wife to discuss leisure interests and community pursuits.  Pt stated active lifestyle PTA but limited interest in TR services.  Pt also stated potential d/c for Saturday.  No formal treatment at this time.  Will continue to monitor.  No c/o pain. The above assessment, treatment plan, treatment alternatives and goals were discussed and mutually agreed upon: by patient  Pierce 11/04/2011, 5:01 PM

## 2011-11-04 NOTE — Evaluation (Signed)
Physical Therapy Assessment and Plan And Session Note  Patient Details  Name: Dean [name] MRN: [id number] Date of Birth: 09/19/1924  PT Diagnosis: Abnormal posture, Difficulty walking, Edema and Muscle weakness Rehab Potential: Good ELOS: 5-7   Today's Date: 11/04/2011 Time: 0810-0900, [id number] Time Calculation (min): 50 min, 50 min, and   Assessment & Plan Clinical Impression: [name] is an 78 year old white right-handed male initially admitted to [facility] on January 2 and diagnosed with MRSA pneumonia with progressive pleural effusion. He underwent thoracentesis on January 8 and ultimately transferred to [facility] on January 10 for treatment of worsening right sided airspace disease, increased WOB, afib with RVR and progressive leucocytosis. Treated with cardizem and amiodarone IV and continued on IV antibiotics. .Patient been on chronic Coumadin therapy prior to admission for atrial fibrillation. Echocardiogram on January 12 with ejection fraction [id number] preserved systolic function mild aortic stenosis. Patient developed large right pleural effusion with complete right lung collapse and underwent chest tube insertion by Dr. [name] on January 14. Placed on linazoid and impenem for sepsis due to PNA and possible colitis. Required BIPAP for respiratory support. Patient with persistent moderate to large loculated pleural fluid collection suggesting empyema and required right VATS with decortication and drainage of empyema on January 17 per Dr. Cyndia Bent. Chest tubes d/c'd 01/22. Post procedure-  with ileus treated and diet slowly advanced. Fluid overload treated with diuretics. MRSA PNA/empyema to be treated with three weeks of Zyxox.   Patient transferred to CIR on 11/03/2011 .  Patient's past medical history is significant for:  Paroxysmal atrial fib Aortic valve disease  Prostate cancer  Environmental allergies   Asthma  Essential thrombocytopenia  Neuropathy  Insomnia  Gout  Obesity   Hypertension   Hyperlipidemia   Osteoarthritis   Hypoxia, sleep related  Patient currently requires min with mobility secondary to muscle weakness and decreased cardiorespiratoy endurance and decreased oxygen support.  Prior to hospitalization, patient was independent, including driving with mobility and lived with wife and daughter   in a one level home, with 1 step to enter, no rail.  Wife available for 24 hour supervision.      Patient will benefit from skilled PT intervention to maximize safe functional mobility, minimize fall risk and decrease caregiver burden for planned discharge home with 24 hour supervision.  Due to pt's previous neck/rotator cuff problems, wheelchair propulsion will not be a focus of tx.   Anticipate patient will benefit from follow up [place] at discharge.  PT - End of Session Activity Tolerance: Tolerates 30+ min activity with multiple rests Endurance Deficit: Yes PT Assessment Rehab Potential: Good Barriers to Discharge: None PT Plan PT Frequency: 1-2 X/day, 60-90 minutes Estimated Length of Stay: 5-7 PT Treatment/Interventions: Ambulation/gait training;Balance/vestibular training;Patient/family education;DME/adaptive equipment instruction;Functional mobility training;Stair training;Therapeutic Activities;Therapeutic Exercise;UE/LE Coordination activities;UE/LE Strength taining/ROM;Wheelchair propulsion/positioning PT Recommendation Follow Up Recommendations: Home health PT Equipment Recommended: Rolling walker with 5" wheels  Precautions/Restrictions Precautions Precautions: Fall Required Braces or Orthoses: No Restrictions Weight Bearing Restrictions: No General  Vital Signs Therapy Vitals Pulse Rate: 100  BP: 103/63 mmHg Patient Position, if appropriate: Sitting Oxygen Therapy SpO2: 96 % O2 Device: Nasal cannula O2 Flow Rate (L/min): 2 L/min Pulse Oximetry Type: Intermittent Pain Pain Assessment Pain Assessment: No/denies pain Pain Score: 0-No  pain Home Living/Prior Functioning   Vision/Perception     Cognition Overall Cognitive Status: Appears within functional limits for tasks assessed Sensation Sensation Light Touch: Appears Intact (bil feet very callused) Proprioception: Not tested Motor  Mobility   Locomotion  Ambulation Ambulation/Gait Assistance: 4: Min assist (additional person needed to transport O2)  Trunk/Postural Assessment  Cervical Assessment Cervical Assessment: Exceptions to [facility] (rotation and extension limited; forward head) Thoracic Assessment Thoracic Assessment: Exceptions to [facility] (kyphosis; R shoulder higher than L) Lumbar Assessment Lumbar Assessment: Within Functional Limits Postural Control Postural Control: Deficits on evaluation  Balance Dynamic Standing Balance Dynamic Standing - Balance Support: Left upper extremity supported Dynamic Standing - Level of Assistance: 4: Min assist Extremity Assessment      RLE Assessment RLE Assessment: Within Functional Limits (grossly 4/5); limited muscle mass hip abductors LLE Assessment LLE Assessment: Within Functional Limits (grossly 4/5)limited muscle mass hip abductors  Recommendations for other services: None  Discharge Criteria: Patient will be discharged from PT if patient refuses treatment 3 consecutive times without medical reason, if treatment goals not met, if there is a change in medical status, if patient makes no progress towards goals or if patient is discharged from hospital.  The above assessment, treatment plan, treatment alternatives and goals were discussed and mutually agreed upon: by patient  !st treatment: w/c propulsion gym< room, 150' with supervision , min A for brakes and legrest management, for increased activity tolerance.  Pt reported no increase in neck/shoulder pain with w/c propulsion.    2nd treatment: no pain when questioned.   vitals- O2 sats 97%, HR 107.  gait training with RW x 87' x 2, x 150' with min A to  S, focusing on forward gaze, wider base of support, and upright posture. 10 m timed walk test= 17 seconds, = 1.7'/sec, risk for recurrent falls.   Balance retraining for ankle strategy with standing toe raises, calf raises, 10 x 1 each.  Slow recruitment L>R ant. tib . Noted.  Scooting on mat x 10' without using UEs,  for wt shifting, leg strengthening, with difficulty.  In supine, bil bridging 10 x 1 without use of UEs, slowly with difficulty.  Sit>< supine mod I, with extra time.   Ascended/descended 5 steps with 2 rails with min/S for safety, with intermittent alternating of feet.  On 2L O2 [place].  3rd treatment: No pain.  Pt sleeping, but aroused easily.  On 2L O2 via [place] Bed mobility with extra time, HOB elevated, but no rail.  Gait training with RWx 20' x 2 in room to/from toilet, with min A for toilet transfer; toileting performed by pt with min steadying A.  Gait in room with RW with close supervision during functional activity of negotiating congested area, dynamic balance with S for folding clothes and placing in drawer.  Pt very fatigued, asked to lie down again before next therapy.   Kreston Ahrendt 11/04/2011, 9:42 AM

## 2011-11-04 NOTE — Plan of Care (Signed)
Overall Plan of Care [facility]) Patient Details Name: Dean [name] MRN: [id number] DOB: [birth year]  Diagnosis:  Deconditioning post empyema, VATS, respiratory failure  Primary Diagnosis:    <principal problem not specified> Co-morbidities: see above  Functional Problem List  Patient demonstrates impairments in the following areas: Balance, Edema, Endurance, Motor and Pain, decreased use of UE's secondary to generalized weakness  Basic ADL's: grooming, bathing, dressing and toileting  Transfers:  bed mobility, bed to chair, car and furniture toilet and shower transfers Locomotion:  ambulation and wheelchair mobility  Additional Impairments:  Functional use of upper extremity, activity tolerance  Anticipated Outcomes Item Anticipated Outcome  Eating/Swallowing  Mod I  Basic self-care  Mod I  Tolieting   Mod I  Bowel/Bladder   Patient will remain continent of bowel and bladder  Transfers   supervision for basic, furniture, and car  Locomotion    Supervision for ambulation x 150' controlled env., 50' home environment   Communication    Cognition    Pain   Patient will rate pain <2, stated goal  Safety/Judgment   Mod I  Other     Therapy Plan:  1-2x per day, 60-90 minutes for 6 days.         Team Interventions: Item RN PT OT SLP SW TR Other  Self Care/Advanced ADL Retraining   x      Neuromuscular Re-Education         Therapeutic Activities  x x      UE/LE Strength Training/ROM  x x      UE/LE Coordination Activities  x       Visual/Perceptual Remediation/Compensation         DME/Adaptive Equipment Instruction  x x      Therapeutic Exercise  x x      Balance/Vestibular Training  x x      Patient/Family Education X x x      Cognitive Remediation/Compensation         Functional Mobility Training  x x      Ambulation/Gait Training  x       IT trainer  x       Wheelchair Propulsion/Positioning  x       Functional IT sales professional  Reintegration   x      Dysphagia/Aspiration Environmental consultant         Bladder Management         Bowel Management         Disease Management/Prevention         Pain Management X x       Medication Management X        Skin Care/Wound Management X        Splinting/Orthotics         Discharge Planning X x x  x    Psychosocial Support     x                       Team Discharge Planning: Destination:  Home Projected Follow-up:  PT and [place] OT Projected Equipment Needs: rolling Environmental consultant, possibly wheelchair for community distances;  OT TBA Patient/family involved in discharge planning:  Yes  MD ELOS: 6 days Medical Rehab Prognosis:  Excellent Assessment: Pt has been admitted to CIR to focus on self-care, balance, gait, stamina, pain control.  He is receiving at  least 3 hours of therapy per day at least 5 days per week. Goals are mod I to supervision.  Pt is motivated.

## 2011-11-04 NOTE — Progress Notes (Signed)
Physical Therapy Note  Patient Details  Name: Dean [name] MRN: [id number] Date of Birth: Apr 16, 1924 Today's Date: 11/04/2011  Time In:  0930  Time Out:  [id number].  Patient denies pain. Individual session. Patient seen for OT evaluation. See OT eval for details.  Treatment then focused ADL retraining with emphasis on sit to stand, standing balance, increasing activity tolerance, increasing UE strength via functional activity, discharge planning, patient education.  Patient extremely motivated.   Quay Burow 11/04/2011, 11:08 AM

## 2011-11-04 NOTE — Evaluation (Signed)
Occupational Therapy Assessment and Plan  Patient Details  Name: Dean [name] MRN: [id number] Date of Birth: 1924-09-14  OT Diagnosis: muscle weakness (generalized) Rehab Potential: Rehab Potential: Good ELOS: 6 days   Today's Date: 11/04/2011 Time: 0810-0900 Time Calculation (min): 50 min  Assessment & Plan Clinical Impression: Patient is a 78 y.o. year old male with recent admission to the hospital on pneumonia.  See history and physical for details.  Patient now presents with significant deconditioning affecting basic mobility and self care.  Patient transferred to CIR on 11/03/2011 .   Patient currently requires min with basic self-care skills secondary to decreased cardiorespiratoy endurance.  Prior to hospitalization, patient was  independent in all activities.  Patient will benefit from skilled intervention to increase independence with basic self-care skills prior to discharge home with care partner.  Anticipate patient will require intermittent supervision and follow up home health.  OT - End of Session Activity Tolerance: Tolerates 10 - 20 min activity with multiple rests Endurance Deficit: Yes Endurance Deficit Description: patient requires frequent rest breaks.  using 2 liters of nasal O2 OT Assessment Rehab Potential: Good Barriers to Discharge: None OT Plan OT Frequency: 1-2 X/day, 60-90 minutes Estimated Length of Stay: 6 days OT Treatment/Interventions: Balance/vestibular training;Community reintegration;DME/adaptive equipment instruction;Functional mobility training;Patient/family education;Self Care/advanced ADL retraining;Therapeutic Activities;Therapeutic Exercise;UE/LE Strength taining/ROM OT Recommendation Follow Up Recommendations: Home health OT Equipment Recommended: Rolling walker with 5" wheels Equipment Details: patient has access to 3 in 1 and walker per patient.  patient also has built in shower seat  Precautions/Restrictions   Precautions Precautions: Fall Required Braces or Orthoses: No Restrictions Weight Bearing Restrictions: No General   Vital Signs Therapy Vitals Pulse Rate: 100  BP: 103/63 mmHg Patient Position, if appropriate: Sitting Oxygen Therapy SpO2: 96 % O2 Device: Nasal cannula O2 Flow Rate (L/min): 2 L/min Pulse Oximetry Type: Intermittent Pain Pain Assessment Pain Assessment: No/denies pain Pain Score: 0-No pain Home Living/Prior Functioning   ADL ADL Eating: Not assessed Grooming: Minimal assistance (due to weakness in UE's ) Where Assessed-Grooming: Standing at sink Upper Body Bathing: Setup Where Assessed-Upper Body Bathing: Sitting at sink Lower Body Bathing: Supervision/safety;Setup Where Assessed-Lower Body Bathing: Other (Comment) (sit to stand at sink) Upper Body Dressing: Moderate assistance (assist required due to UE's weakness) Where Assessed-Upper Body Dressing: Sitting at sink Lower Body Dressing: Minimal assistance Where Assessed-Lower Body Dressing: Sitting at sink (sit to stand at sink) Toilet Transfer: Not assessed Toilet Transfer Method: Not assessed Tub/Shower Transfer: Not assessed (patient to shower tomorrow with seat) Gaffer Transfer: Not assessed ADL Comments: patient has built in seat in shower at home with door.  Requires assist secondary to poor activity tolerance and weakness in UE's Vision/Perception  Vision - History Baseline Vision: Wears glasses only for reading Vision - Assessment Eye Alignment: Within Functional Limits Perception Perception: Within Functional Limits Praxis Praxis: Intact  Cognition Overall Cognitive Status: Appears within functional limits for tasks assessed Sensation Sensation Light Touch: Appears Intact Stereognosis: Appears Intact Hot/Cold: Appears Intact Proprioception: Appears Intact Coordination Gross Motor Movements are Fluid and Coordinated: Not tested Fine Motor Movements are Fluid and  Coordinated: Not tested Motor  Motor Motor: Other (comment) Motor - Skilled Clinical Observations: generalized weakness and decreased weakness in UE"s for functional tasks. Mobility  Transfers Sit to Stand: 5: Supervision Sit to Stand Details (indicate cue type and reason): using both UE's Stand to Sit: 5: Supervision Stand to Sit Details: using both UE's  Trunk/Postural Assessment  Cervical Assessment Cervical  Assessment: Exceptions to [facility] (rotation and extension limited; forward head) Thoracic Assessment Thoracic Assessment: Exceptions to [facility] (kyphosis; R shoulder higher than L) Lumbar Assessment Lumbar Assessment: Within Functional Limits Postural Control Postural Control: Deficits on evaluation  Balance Dynamic Standing Balance Dynamic Standing - Balance Support: Left upper extremity supported Dynamic Standing - Level of Assistance: 4: Min assist Extremity/Trunk Assessment RUE Assessment RUE Assessment:  (generalized weakness 2+/5) LUE Assessment LUE Assessment:  (generalized weakness 2+/5)  Recommendations for other services: None  Discharge Criteria: Patient will be discharged from OT if patient refuses treatment 3 consecutive times without medical reason, if treatment goals not met, if there is a change in medical status, if patient makes no progress towards goals or if patient is discharged from hospital.  The above assessment, treatment plan, treatment alternatives and goals were discussed and mutually agreed upon: by patient  Quay Burow 11/04/2011, 10:57 AM

## 2011-11-04 NOTE — Progress Notes (Signed)
ANTICOAGULATION CONSULT NOTE  Pharmacy Consult:  Coumadin Indication: h/o AFib  No Known Allergies  Patient Measurements:    Vital Signs: Temp: 98.8 F (37.1 C) (01/29 0454) Temp src: Oral (01/29 0454) BP: 111/61 mmHg (01/29 0454) Pulse Rate: 90  (01/29 0454)  Labs:  Dean [name] 11/04/11 0635 11/03/11 0844 11/02/11 0500  HGB 9.8* 10.3* --  HCT 30.5* 31.8* --  PLT 393 409* --  APTT -- -- --  LABPROT 16.0* 16.9* 19.1*  INR 1.25 1.35 1.57*  HEPARINUNFRC -- -- --  CREATININE 0.94 1.04 --  CKTOTAL -- -- --  CKMB -- -- --  TROPONINI -- -- --   The CrCl is unknown because both a height and weight (above a minimum accepted value) are required for this calculation.  Medical History: Past Medical History  Diagnosis Date  . PAF (paroxysmal atrial fibrillation)     followed by [place] cardiology  . Aortic valve disease     followed by [facility] cardiology  . Prostate cancer     s/p seed implants  . Environmental allergies   . Asthma   . Essential thrombocytopenia     followed by Dr Bobby Rumpf (hematology)  . Neuropathy   . Insomnia   . Gout   . Obesity   . Hypertension   . Hyperlipidemia   . Osteoarthritis   . Hypoxia, sleep related     Assessment: s/p VATS and drainage of empyema 10/23/11. 44 YOM with hx of Afib on Coumadin PTA (5mg  on Tues, Fri. And 4mg  on Mon, Wed, Thurs, Sat, Sun.).  Coumadin has been on hold since 10/16/11. INR 1.25 today. Bridge with Lovenox 40mg /d until INR>2.  MRSA PNA s/p VATS and drainage of empyema: Zyvox through 11/09/11.  Goal of Therapy:  INR 2-3   Plan:  - Start Coumadin 5 mg po x1 tonight.  - Daily INR.

## 2011-11-04 NOTE — Progress Notes (Signed)
Inpatient Rehabilitation Center Individual Statement of Services  Patient Name:  Dean [name]  Date:  11/04/2011  Welcome to the [facility].  Our goal is to provide you with an individualized program based on your diagnosis and situation, designed to meet your specific needs.  With this comprehensive rehabilitation program, you will be expected to participate in at least 3 hours of rehabilitation therapies Monday-Friday, with modified therapy programming on the weekends.  Your rehabilitation program will include the following services:  Physical Therapy (PT), Occupational Therapy (OT), 24 hour per day rehabilitation nursing, Therapeutic Recreaction (TR), Case Management (RN and Education officer, museum), Rehabilitation Medicine, Nutrition Services and Pharmacy Services  Weekly team conferences will be held on  Tuesday  to discuss your progress.  Your RN Case Writer will talk with you frequently to get your input and to update you on team discussions.  Team conferences with you and your family in attendance may also be held.  Estimated Length of Stay: 5-7 days                           Goals: Supervision -Modified Independent  Depending on your progress and recovery, your program may change.  Your RN Case Engineer, production will coordinate services and will keep you informed of any changes.  Your RN Tourist information centre manager and SW names and contact numbers are listed  below.  The following services may also be recommended but are not provided by the [facility] will be made to provide these services after discharge if needed.  Arrangements include referral to agencies that provide these services.  Your insurance has been verified to be:  Medicare + [employer] Your primary doctor is:  Dr. Blanchie Serve,  [place]  Pertinent information will be shared with your doctor and your insurance company.  Case Manager: Lutricia Feil, [facility] [phone]  Social Worker:  [place], [place]  Information discussed with and copy given to patient by: Theora Master, 11/04/2011, 11:20 AM

## 2011-11-05 DIAGNOSIS — J15212 Pneumonia due to Methicillin resistant Staphylococcus aureus: Secondary | ICD-10-CM

## 2011-11-05 DIAGNOSIS — J159 Unspecified bacterial pneumonia: Secondary | ICD-10-CM

## 2011-11-05 DIAGNOSIS — J860 Pyothorax with fistula: Secondary | ICD-10-CM

## 2011-11-05 DIAGNOSIS — Z5189 Encounter for other specified aftercare: Secondary | ICD-10-CM

## 2011-11-05 DIAGNOSIS — J869 Pyothorax without fistula: Secondary | ICD-10-CM

## 2011-11-05 DIAGNOSIS — R5381 Other malaise: Secondary | ICD-10-CM

## 2011-11-05 LAB — PROTIME-INR
INR: 1.18 (ref 0.00–1.49)
Prothrombin Time: 15.2 seconds (ref 11.6–15.2)

## 2011-11-05 MED ORDER — WARFARIN SODIUM 5 MG PO TABS
5.0000 mg | ORAL_TABLET | Freq: Once | ORAL | Status: AC
  Administered 2011-11-05: 5 mg via ORAL
  Filled 2011-11-05: qty 1

## 2011-11-05 NOTE — Progress Notes (Signed)
Per State Regulation 482.30 This chart was reviewed for medical necessity with respect to the patient's Admission/Duration of stay. Pt & wife updated about team conference report: ELOS 11/08/11  Supervision-mod I goals.  Wife says she & daughter will come in for family ed Friday 11/07/11 betw 9am-12noon.  Pt on O2, receiving pain management, working on activity tolerance-pt is motivated.   Theora Master                 Nurse Care Manager              Next Review Date: none

## 2011-11-05 NOTE — Progress Notes (Signed)
Patient alert and oriented, able to make needs known appropriately. Hard of hearing wears bilateral hearing aids. Has O2 at 2l per [place]. Right chest with steri of old chest tubes. Left buttocks with a dime-sized red area- allevyn dressing placed, left inner upper thigh with red area with allevyn dressing placed. Pain managed with Lidocaine patch to right chest.Will continue with plan of care.

## 2011-11-05 NOTE — Progress Notes (Signed)
ANTICOAGULATION CONSULT NOTE  Pharmacy Consult:  Coumadin Indication: h/o AFib  No Known Allergies  Patient Measurements: Weight: 187 lb 3.2 oz (84.913 kg)  Vital Signs: Temp: 98.2 F (36.8 C) (01/30 1242) Temp src: Oral (01/30 1242) BP: 110/72 mmHg (01/30 1242) Pulse Rate: 78  (01/30 1242)  Labs:  Dean [name] 11/04/11 0635 11/03/11 0844  HGB 9.8* 10.3*  HCT 30.5* 31.8*  PLT 393 409*  APTT -- --  LABPROT 16.0* 16.9*  INR 1.25 1.35  HEPARINUNFRC -- --  CREATININE 0.94 1.04  CKTOTAL -- --  CKMB -- --  TROPONINI -- --   The CrCl is unknown because both a height and weight (above a minimum accepted value) are required for this calculation.  Medical History: Past Medical History  Diagnosis Date  . PAF (paroxysmal atrial fibrillation)     followed by [place] cardiology  . Aortic valve disease     followed by [facility] cardiology  . Prostate cancer     s/p seed implants  . Environmental allergies   . Asthma   . Essential thrombocytopenia     followed by Dr Bobby Rumpf (hematology)  . Neuropathy   . Insomnia   . Gout   . Obesity   . Hypertension   . Hyperlipidemia   . Osteoarthritis   . Hypoxia, sleep related     Assessment: s/p VATS and drainage of empyema 10/23/11. 68 YOM with hx of Afib on Coumadin PTA (5mg  on Tues, Fri. And 4mg  on Mon, Wed, Thurs, Sat, Sun.).  INR 1.25 yesterday and not done today. Bridge with Lovenox 40mg /d until INR>2.  MRSA PNA s/p VATS and drainage of empyema: Zyvox through 11/09/11.  Goal of Therapy:  INR 2-3   Plan:  - Repeat Coumadin 5 mg po x1 tonight.  - Daily INR.

## 2011-11-05 NOTE — Progress Notes (Signed)
Name: Dean [name] MRN: [id number] DOB: 02/13/1924    LOS: 2   PCCM Progress Note  Brief patient profile:  72 yowm to [facility] on 1/2. Dx eval demonstrated MRSA PNA.Had progressive pleural effusion with thoracentesis on 1/8 showing exudative  > to [facility] on 1/10 for further evaluation of  worsening of Right sided airspace disease, increased work of breathing, AF w/ RVR and progressive leukocytosis.   Lines / Drains: Rt chest tube 1/14 >>>VATS,drainage 1/17>>>1/23 Rt IJ 1/16>>>1/23 ETT 1/17> 1/18   Micro/Sepsis c-diff pcr 1/10>>neg  MRSA pcr>> +ve Sputum 1/7>>>MRSA  Influenza PCR1/2>>> Negative BAL 1/13>>>MRSA R Pleural 1/17 >  MRSA   Antibiotics: Vanco 1/5>>>1/13 Zosyn 1/10>>>1/13 Levaquin 1/2>>>1/10 Rocephin 1/2>>>1/10  primaxin 1/13>>>1/16 zyvox (MRSA PNA/ empyema)1/13>>> plan 3 weeks   Tests / Events: 1/10 CT Chest: increase in right pleural effusion, mucous plugging 1/10 CT abd: no evidence of bowel obstruction or ileus 1/8: thoracentesis on 1/8 yielding a exudative sample consistent with a parapneumonia effusion.   ECHO 10/17/2010: EF 55%, preserved systolic function. Mild aortic stenosis.  1/10- no distress 1/13 - resp distress, hypoxia, complete right lung collapse. S/p Bronchoscopy (DF)- obstructed rt main with blood clot mixed with pus.  1/14- prior exudate knonw, increased effusion, chest tube placed, hydro pneurmo improved 1/14 - CT chest - hydro PNA, PNA 1/14- amio strted 1/15- improved Rt lung aeration post Chest tube placement 1/16- cardizem drip stopped, breathing unchanged, gross tolerated neg balance, no distress 1/17- s/p VATS, empyema drainage, rt lung decortication.  vent post op 1/23- chest tubes removed 1/24- Bm noted, no distress, rt base atx 1/25- no distress 1/28 - in floor bed. Off chest tube. Feels well. Eating solids. 129- transferred to rehab 1/30 - doing better in rehab  Subjective/Overnight event:   pt did well overnight  with no increased wob.  Using IS at bedside   . REhab RN and PT says he is improving. Still with  Edema in feet. Some small bedsores per RN                                                                            Vital Signs: Temp:  [98.3 F (36.8 C)-98.8 F (37.1 C)] 98.3 F (36.8 C) (01/30 0600) Pulse Rate:  [90-105] 105  (01/30 0908) Resp:  [20] 20  (01/30 0600) BP: (109-110)/(54-65) 110/65 mmHg (01/30 0600) SpO2:  [93 %-98 %] 98 % (01/30 0908) FiO2 (%):  [28 %] 28 % (01/29 1616)    Physical Examination: BP 110/65  Pulse 105  Temp(Src) 98.3 F (36.8 C) (Oral)  Resp 20  SpO2 98%  General Appearance:    Alert, cooperative, no distress, appears stated age. Chronically unwell looking. DECONDITIONED but LOOKING BETTER than 11/03/11  Head:    Normocephalic, without obvious abnormality, atraumatic  Eyes:    PERRL, conjunctiva/corneas clear, EOM's intact, fundi    benign, both eyes       Ears:    Normal TM's and external ear canals, both ears  Nose:   Nares normal, septum midline, mucosa normal, no drainage   or sinus tenderness  Throat:   Lips, mucosa, and tongue normal; teeth and gums normal  Neck:   Supple, symmetrical,  trachea midline, no adenopathy;       thyroid:  No enlargement/tenderness/nodules; no carotid   bruit or JVD  Back:     Symmetric, no curvature, ROM normal, no CVA tenderness  Lungs:     Clear to auscultation bilaterally, respirations unlabored  Chest wall:    No tenderness or deformity  Heart:    Regular rate and rhythm, S1 and S2 normal, no murmur, rub   or gallop  Abdomen:     Soft, non-tender, bowel sounds active all four quadrants,    no masses, no organomegaly  Genitalia:    Normal male without lesion, discharge or tenderness  Rectal:    Normal tone, normal prostate, no masses or tenderness;   guaiac negative stool  Extremities:   Extremities normal, atraumatic, no cyanosis but has ++ EDEMA  Pulses:   2+ and symmetric all extremities  Skin:   Skin  color, texture, turgor normal, no rashes or lesions  Lymph nodes:   Cervical, supraclavicular, and axillary nodes normal  Neurologic:   CNII-XII intact. Normal strength, sensation and reflexes      throughout       Labs   Lab 11/04/11 0635 11/03/11 0844 11/01/11 0720  NA 139 137 136  K 3.5 3.4* 3.7  CL 101 98 98  CO2 31 30 31   BUN 13 11 14   CREATININE 0.94 1.04 0.99  GLUCOSE 105* 105* 94    Lab 11/04/11 0635 11/03/11 0844 10/31/11 0425  HGB 9.8* 10.3* 10.1*  HCT 30.5* 31.8* 31.6*  WBC 5.3 6.1 7.3  PLT 393 409* 415*         Assessment and Plan:  MRSA PNA,  Empyema, Debility, 3rd spacing/edema Pt is slowly improving.  Chest tubes are out, and has no increased wob.But is very deconditioned and debility + (Baseline was very functional). Also with 3rd spacing edema +  PLAN  - saline lock IV  - Continue linezolid  - PT/OT/Mobilize/inoatient rehab to continue  -- solid diet  - periodic cxr  EDEMA  - check bnp and diurese if high   PCCM will see periodically  Dr. Brand Males, M.D., [facility].C.P Pulmonary and Critical Care Medicine Staff Physician [facility] Pager: [phone], If no answer or between  15:00h - 7:00h: call [phone]  11/05/2011 11:01 AM

## 2011-11-05 NOTE — Progress Notes (Signed)
Physical Therapy Session Note  Patient Details  Name: DAZIEL KILDAY MRN: [id number] Date of Birth: [birth year]  Today's Date: 11/05/2011 Time: [id number] Time Calculation (min): 57 min  Precautions: Precautions Precautions: Fall Short Term Goals: PT Short Term Goal 1: same as LTGs due to LOS  Skilled Therapeutic Interventions/Progress Updates:    Monitor O2 and HR during activity.  Gait and balance training.  Vital Signs Pulse Rate: 105  (=EHR) Oxygen Therapy SpO2: 98 % O2 Device: Nasal cannula O2 Flow Rate (L/min): 2 L/min Pulse Oximetry Type: Intermittent Pain Pain Assessment Pain Assessment: No/denies pain (Reports neck and shoulder pain during the night.) Mobility Transfers Sit to Stand: 4: Min assist Stand to Sit: 4: Min assist Locomotion  Ambulation Ambulation/Gait Assistance: 4: Min assist (progressed to supervision) Ambulation Distance (Feet): 160 Feet Assistive device: Rolling walker Ambulation/Gait Assistance Details (indicate cue type and reason): Second walk x 60' x 2 without device and min @, pt using a wide base of support and short step length.  Reported feeling off balance.  Exercises Cardiovascular Exercises NuStep: work load=1 x  6 min  (for strengtheining and endurance training.)  Therapy/Group: Individual Therapy  Waylan Boga 11/05/2011, 12:23 PM

## 2011-11-05 NOTE — Progress Notes (Signed)
Physical Therapy Note  Patient Details  Name: Dean [name] MRN: [id number] Date of Birth: [birth year] Today' s Date: 11/05/2011  16:00- 16:15 individual therapy pt did not rate in in rt chest. Just stated it limited his sleeping. Pt given HEP for LE including standing hip abduction, hamstring curls , sqauts, heel toe, and LAQ. Pt performed x 10 each.   Gevena Mart Nicole16:00 11/05/2011, 4:20 PM

## 2011-11-05 NOTE — Progress Notes (Signed)
Patient information reviewed and entered into [facility] system on 11/04/2011 by Daiva Nakayama, RN, [place], [place] Coordinator.  Information including medical coding and functional independence measure will be reviewed and updated through discharge.     Per nursing patient was given "Data Collection Information Summary for Patients in Inpatient Rehabilitation Facilities with attached "Privacy Act [place] Records" upon admission.

## 2011-11-05 NOTE — Patient Care Conference (Signed)
Inpatient RehabilitationTeam Conference Note Date: 11/04/2011   Time: 1:50 PM    Patient Name: Dean [name]      Medical Record Number: [id number]  Date of Birth: 06/29/1924 Sex: Male         Room/Bed: 4008/4008-01 Payor Info: Payor: MEDICARE  Plan: MEDICARE PART A AND B  Product Type: *No Product type*     Admitting Diagnosis: mrsa,pna,deconditioned  Admit Date/Time:  11/03/2011  3:19 PM Admission Comments: No comment available   Primary Diagnosis:  Physical deconditioning Principal Problem: Physical deconditioning  Patient Active Problem List  Diagnoses Date Noted  . Physical deconditioning 11/04/2011  . MRSA pneumonia 10/16/2011  . Exudative pleural effusion 10/16/2011  . Dyspnea 10/16/2011  . Ileus 10/16/2011  . Constipation 10/16/2011  . Atrial fibrillation with RVR 10/16/2011  . Abdominal pain 10/16/2011  . Sepsis 10/16/2011  . Obesity 10/16/2011  . GERD (gastroesophageal reflux disease) 10/16/2011  . Hyponatremia 10/16/2011  . Urinary retention 10/16/2011  . Atelectasis 10/16/2011  . PAF (paroxysmal atrial fibrillation)   . Aortic valve disease   . Prostate cancer   . Environmental allergies   . Asthma   . Essential thrombocytopenia   . Neuropathy   . Insomnia   . Gout   . Hypertension   . Hyperlipidemia   . Osteoarthritis   . Hypoxia, sleep related     Expected Discharge Date: Expected Discharge Date: 11/08/11  Team Members Present: Physician: Dr. Alger Simons Case Manager Present: Lutricia Feil, RN Social Worker Present: Lennart Pall, LCSW PT Present: Canary Brim, Toma Copier, PTA OT Present: Chrys Racer, OT RN Present: Evlyn Clines    Current Status/Progress Goal Weekly Team Focus  Medical   respiratory compromised still, but patient working through.  pain is an issue  increased pulmonary/activity tolerance. adequate pain control, monitor from an id standpoint  see above   Bowel/Bladder   Continent of bowel, and bladder use urinal  independently  mod I      Swallow/Nutrition/ Hydration             ADL's   overall supervision  overall modified independent  ADL retraining and increasing overall activity tolerance/endurance   Mobility   min A transfers and gait x 60'; supervision w/c x 120'  supervision overall  increased activity tolerance; strengthening, mobility   Communication             Safety/Cognition/ Behavioral Observations            Pain   less than 3  less than 3      Skin   Has a small red area on left buttocks with allevyn dressing applied, and left inner thigh with 2 red areas allevyn dressing applied. Scattered brusing of abd.            *See Interdisciplinary Assessment and Plan and progress notes for long and short-term goals  Barriers to Discharge: pulmonary stamina    Possible Resolutions to Barriers:       Discharge Planning/Teaching Needs:  Home with wife and daughter available to provide 24/7 assistance      Team Discussion: Pt very motivated.  Need wife in for family ed.   Revisions to Treatment Plan: none    Continued Need for Acute Rehabilitation Level of Care: The patient requires daily medical management by a physician with specialized training in physical medicine and rehabilitation for the following conditions: Daily direction of a multidisciplinary physical rehabilitation program to ensure safe treatment while eliciting the highest outcome  that is of practical value to the patient.: Yes Daily medical management of patient stability for increased activity during participation in an intensive rehabilitation regime.: Yes Daily analysis of laboratory values and/or radiology reports with any subsequent need for medication adjustment of medical intervention for : Pulmonary problems;Post surgical problems;Neurological problems  Theora Master 11/05/2011, 2:20 PM

## 2011-11-05 NOTE — Discharge Summary (Signed)
I personally evaluated patient. He needs re-conditioning and should go to inpatient rehab

## 2011-11-05 NOTE — Progress Notes (Signed)
Occupational Therapy Session Note  Patient Details  Name: Dean [name] MRN: [id number] Date of Birth: 1924/09/26  Today's Date: 11/05/2011 Time: 1025-1105 Time Calculation (min): 40 min  Precautions: Precautions Precautions: Fall Required Braces or Orthoses: No Restrictions Weight Bearing Restrictions: No  Short Term Goals: OT Short Term Goal 1: Patient will be mod I with bathing at shower level OT Short Term Goal 2: Patient will be supervision with shower transfers OT Short Term Goal 3: Patient will be mod I with dressing OT Short Term Goal 4: Patient will be mod I with toileting OT Short Term Goal 5: Patient will be mod I with grooming  Skilled Therapeutic Interventions/Progress Updates:  Engaged in ADL retraining at shower level. Focused skilled OT on bed mobility, functional ambulation/mobility using rolling walker throughout room, safety with rolling walker, shower transfer, dynamic standing balance/endurance, UB/LB bathing in sit -> stand position, UB/LB dressing in sit -> stand position, increasing overall activity tolerance/endurance.   Pain - No complaints of pain  ADL - See FIM  Therapy/Group: Individual Therapy  [name] 11/05/2011, 11:21 AM

## 2011-11-05 NOTE — Progress Notes (Signed)
Occupational Therapy Session Note  Patient Details  Name: Dean [name] MRN: [id number] Date of Birth: 01/20/1924  Today's Date: 11/05/2011 Time: 1430-1540 Time Calculation (min): 70 min  Precautions: Precautions Precautions: Fall Precaution Comments: contact precautions, monitor O2 sats for weaning Required Braces or Orthoses: No Restrictions Weight Bearing Restrictions: No  Short Term Goals: OT Short Term Goal 1: Patient will be mod I with bathing at shower level OT Short Term Goal 2: Patient will be supervision with shower transfers OT Short Term Goal 3: Patient will be mod I with dressing OT Short Term Goal 4: Patient will be mod I with toileting OT Short Term Goal 5: Patient will be mod I with grooming  Skilled Therapeutic Interventions/Progress Updates:   Initially worked on grooming at the sink in standing.  Pt able to tolerate standing for 3-4 mins without dyspnea being more than 2/4 and O2 sats at 91 to 94 % entire session.  Transitioned to working on BUE strengthening with emphasis on shoulder and scapular strengthening.  Pt needs assist to perform shoulder flexion  Bilaterally.  Emphasis on upright posture and shoulders back during scapular/shoulder extension exercises.  Finished with 15 mins of moist heat to each shoulder to help relieve pain.   Vital Signs Therapy Vitals Temp: 98.2 F (36.8 C) Temp src: Oral Pulse Rate: 95  Resp: 20  BP: 110/72 mmHg Patient Position, if appropriate: Sitting Oxygen Therapy SpO2: 94 % (O2 SATS 91-95 % on room air throught session.) O2 Device: None (Room air) O2 Flow Rate (L/min): 2 L/min Pulse Oximetry Type: Intermittent Pain Pain Assessment Pain Assessment: 0-10 Pain Score:   2 Pain Type: Chronic pain Pain Location: Shoulder Pain Orientation: Right;Left Pain Intervention(s): Heat applied  Exercises Shoulder Exercises Shoulder Flexion: AAROM;Limitations;5 reps;Other (comment) (Performed 2 sets of 5 repetitions for each  arm.) Shoulder Extension: Strengthening;Both;10 reps;Seated;Theraband;Other (comment) (Orange theraband for 3 sets of 10 repetitions.)  Therapy/Group: Individual Therapy  Dim Meisinger OTR/L 11/05/2011, 3:56 PM Pager number [phone]

## 2011-11-06 LAB — PRO B NATRIURETIC PEPTIDE: Pro B Natriuretic peptide (BNP): 844.3 pg/mL — ABNORMAL HIGH (ref 0–450)

## 2011-11-06 LAB — PROTIME-INR: INR: 1.19 (ref 0.00–1.49)

## 2011-11-06 MED ORDER — SALINE SPRAY 0.65 % NA SOLN
1.0000 | NASAL | Status: DC | PRN
  Filled 2011-11-06: qty 44

## 2011-11-06 MED ORDER — WARFARIN SODIUM 7.5 MG PO TABS
7.5000 mg | ORAL_TABLET | Freq: Once | ORAL | Status: AC
  Administered 2011-11-06: 7.5 mg via ORAL
  Filled 2011-11-06: qty 1

## 2011-11-06 MED ORDER — LEVALBUTEROL HCL 0.63 MG/3ML IN NEBU
0.6300 mg | INHALATION_SOLUTION | Freq: Two times a day (BID) | RESPIRATORY_TRACT | Status: DC
  Administered 2011-11-06 – 2011-11-08 (×4): 0.63 mg via RESPIRATORY_TRACT
  Filled 2011-11-06 (×5): qty 3

## 2011-11-06 MED ORDER — HYDROXYUREA 500 MG PO CAPS
500.0000 mg | ORAL_CAPSULE | ORAL | Status: DC
  Administered 2011-11-06: 500 mg via ORAL
  Filled 2011-11-06 (×2): qty 1

## 2011-11-06 MED ORDER — LEVALBUTEROL HCL 0.63 MG/3ML IN NEBU
0.6300 mg | INHALATION_SOLUTION | Freq: Four times a day (QID) | RESPIRATORY_TRACT | Status: DC | PRN
  Filled 2011-11-06: qty 3

## 2011-11-06 NOTE — Progress Notes (Signed)
ANTICOAGULATION CONSULT NOTE  Pharmacy Consult:  Coumadin Indication: h/o AFib  No Known Allergies  Patient Measurements: Weight: 187 lb 3.2 oz (84.913 kg)  Vital Signs: Temp: 98.4 F (36.9 C) (01/31 0601) Temp src: Oral (01/31 0601) BP: 114/68 mmHg (01/31 0601) Pulse Rate: 97  (01/31 0601)  Labs:  Dean [name] 11/06/11 0615 11/05/11 1217 11/04/11 0635  HGB -- -- 9.8*  HCT -- -- 30.5*  PLT -- -- 393  APTT -- -- --  LABPROT 15.4* 15.2 16.0*  INR 1.19 1.18 1.25  HEPARINUNFRC -- -- --  CREATININE -- -- 0.94  CKTOTAL -- -- --  CKMB -- -- --  TROPONINI -- -- --   The CrCl is unknown because both a height and weight (above a minimum accepted value) are required for this calculation.  Medical History: Past Medical History  Diagnosis Date  . PAF (paroxysmal atrial fibrillation)     followed by [place] [facility]  . Aortic valve disease     followed by [facility] cardiology  . Prostate cancer     s/p seed implants  . Environmental allergies   . Asthma   . Essential thrombocytopenia     followed by Dr Bobby Rumpf (hematology)  . Neuropathy   . Insomnia   . Gout   . Obesity   . Hypertension   . Hyperlipidemia   . Osteoarthritis   . Hypoxia, sleep related     Assessment: s/p VATS and drainage of empyema 10/23/11. 16 YOM with hx of Afib on Coumadin PTA (5mg  on Tues, Fri. And 4mg  on Mon, Wed, Thurs, Sat, Sun.)  Bridge with Lovenox 40mg /d until [employer].  INR subtherapeutic.  MRSA PNA s/p VATS and drainage of empyema: Zyvox through 11/09/11.  Goal of Therapy:  INR 2-3   Plan:  - Increase warfarin to 7.5 mg po x1 tonight.  - Daily INR  Dean [name] L. Amada Jupiter, PharmD, [place] Clinical Pharmacist Pager: [phone] 11/06/2011 1:20 PM

## 2011-11-06 NOTE — Progress Notes (Signed)
Physical Therapy Session Note  Patient Details  Name: JOSIAH MANTEY MRN: [id number] Date of Birth: Nov 06, 1923  Today's Date: 11/06/2011 Time: 0830-0900 Time Calculation (min): 30 min  Precautions: Precautions Precautions: Fall Precaution Comments: contact precautions, monitor O2 sats for weaning Required Braces or Orthoses: No Restrictions Weight Bearing Restrictions: No  Short Term Goals: PT Short Term Goal 1: same as LTGs due to LOS  Vital Signs O2 = 93-96% room air during therapy session  Pain    Premedicated, no complaints.  Gait training on unit with RW for general activity tolerance and strengthening > 200' with overall supervision. Simulated car transfer at [employer] with supervision, managing lower extremities independently. Up/down curb step and ramp with overall min A to simulate home entry, cueing for safety with AD.  Therapy/Group: Individual Therapy  Canary Brim [facility] 11/06/2011, 9:01 AM

## 2011-11-06 NOTE — Progress Notes (Signed)
Social Work  Assessment and Plan  Patient Name: Dean [name]  [id number] Date: 11/06/2011  Problem List:  Patient Active Problem List  Diagnoses  . MRSA pneumonia  . Exudative pleural effusion  . Dyspnea  . Ileus  . Constipation  . Atrial fibrillation with RVR  . Abdominal pain  . Sepsis  . Obesity  . GERD (gastroesophageal reflux disease)  . Hyponatremia  . Urinary retention  . Atelectasis  . PAF (paroxysmal atrial fibrillation)  . Aortic valve disease  . Prostate cancer  . Environmental allergies  . Asthma  . Essential thrombocytopenia  . Neuropathy  . Insomnia  . Gout  . Hypertension  . Hyperlipidemia  . Osteoarthritis  . Hypoxia, sleep related  . Physical deconditioning    Past Medical History:  Past Medical History  Diagnosis Date  . PAF (paroxysmal atrial fibrillation)     followed by [place] [facility]ology  . Aortic valve disease     followed by [facility] cardiology  . Prostate cancer     s/p seed implants  . Environmental allergies   . Asthma   . Essential thrombocytopenia     followed by Dr Bobby Rumpf (hematology)  . Neuropathy   . Insomnia   . Gout   . Obesity   . Hypertension   . Hyperlipidemia   . Osteoarthritis   . Hypoxia, sleep related     Past Surgical History: No past surgical history on file.  Discharge Planning  Discharge Planning Living Arrangements: Spouse/significant other Support Systems: Spouse/significant other;Children (one dtr lives with them but works days; another out of town) Do you have any problems obtaining your medications?: No Type of Residence: Private residence [place]: No Patient expects to be discharged to:: home Expected Discharge Date: 11/08/11  Social/Family/Support Systems Social/Family/Support Systems Patient Roles: Spouse;Parent Contact Information: Sherrie Mustache, daughter; Odinn Bevier, wife Anticipated Caregiver: wife and daughter Ability/Limitations of Caregiver: supervision  level Caregiver Availability: 24/7  Employment Status Employment Status Employment Status: Retired Date 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  Admits frustrated by limitations, "because I got gardening to get to" Recent Psychosocial Issues: none Pyschiatric History: none  Patient/Family Perceptions, Expectations & Goals Pt/Family Perceptions, Expectations and Goals Pt/Family understanding of illness & functional limitations: pt and wife with general understanding of medical issues and need for CIR to rebuild overall strength.  Premorbid pt/family roles/activities: Both pt and wife continue to be active at home and in community - sharing household duties along with daughter. Anticipated changes in roles/activities/participation: initially, wife may need to assume some caregiver duties and overall management of home (along with assistance from daughter) Pt/family expectations/goals: "Get my strength back ... get back to my garden"  [employer]: None Premorbid Home Care/DME Agencies: None Transportation available at discharge: yes  Discharge Research scientist (life sciences) Resources: Education officer, museum (specify) Web designer) Financial Resources: Social Security Financial Screen Referred: No Living Expenses: Own Money Management: Patient;Spouse Home Management: pt and family Patient/Family Preliminary Plans: home with wife and daughter - daughter does work full-time - wife in good health and can assist if needed  Clinical Impression:  Pleasant, oriented gentleman here due  to physical deconditioning following pneumonia.  Likely to have a short stay.  Good support from wife and daughter at home.  No emotional distress noted/ reported, but will monitor.  Follow for d/c needs.  Jordan Hawks, [place] 11/06/2011

## 2011-11-06 NOTE — Progress Notes (Signed)
Physical Therapy Note  Patient Details  Name: EBENEZER BRUINSMA MRN: [id number] Date of Birth: [birth year] Today's Date: 11/06/2011  1600-1655 (55 minutes) group  Pain- no complaint of pain Pt participated in group functional mobility/gait training to improve tolerance to activity and gait safety/endurance. Pt ambulated 160 feet with RW close supervision for safety; 160 feet X 2 without AD min to close supervision to improve protective balance reactions. Nustep (crosstrainer) X 5 minutes  Level 3 with reported Rt abdominal pain.   Peggy Monk,JIM 11/06/2011, 4:59 PM

## 2011-11-06 NOTE — Progress Notes (Signed)
Patient is alert and oriented x 4.  Right chest steri strips to old chest tube site. Left Buttocks with allevyn, and left thigh with two red areas with allevyn in place. Able to ambulate with walker supervision. Pain managed with Lidoderm patch to right flank. Will continue with plan of care.

## 2011-11-06 NOTE — Progress Notes (Signed)
Occupational Therapy Session Notes  Patient Details  Name: Dean [name] MRN: [id number] Date of Birth: [birth year]  Today's Date: 11/06/2011  Precautions: Precautions Precautions: Fall Precaution Comments: contact precautions, monitor O2 sats for weaning Required Braces or Orthoses: No Restrictions Weight Bearing Restrictions: No  Short Term Goals: OT Short Term Goal 1: Patient will be mod I with bathing at shower level OT Short Term Goal 2: Patient will be supervision with shower transfers OT Short Term Goal 3: Patient will be mod I with dressing OT Short Term Goal 4: Patient will be mod I with toileting OT Short Term Goal 5: Patient will be mod I with grooming  ADL - See FIM  Session #1 [id number] - 15 Minutes; Patient missed 30 minutes of skilled therapy Individual Therapy No complaints of pain Upon entering room, patient in bathroom seated on elevated toilet seat. Supervision for toileting (perineal hygiene & clothing management) and supervision for toilet transfer off elevated toilet seat. Therapist then encouraged patient to complete bathing at shower level, patient refused stating "I've got to lay down, I'm exhausted". Therapist continued to encourage patient to participate, patient continued to refuse. Bed mobility back to bed. Therapists next session 1130-1200, patient agreeable to participate then.   Session #2 1130-1200 - 30 Minutes Individual Therapy No complaints of pain Engaged in functional ambulation/mobility using rolling walker throughout hallway and in ADL apartment. Completed simulated walk-in shower transfer with threshold using rolling walker to back up to shower and transferring onto shower chair. Patient at an overall distant supervision level for transfers and functional mobility.   Session #3 [id number] - 30 Minutes Individual Therapy No complaints of pain Treatment focus on functional mobility/ambulation using rollator. Rollator used as Horticulturist, commercial for discharge/home use. Engaged in standard toilet transfer secondary to patient initially stated he didn't think he would need a BSC over his standard toilet seat at home. After practicing transfer, patient required moderate assistance to transfer off of toilet. Recommend BSC for discharge, patient agreeable.   Timo Hartwig 11/06/2011, 11:22 AM

## 2011-11-06 NOTE — Progress Notes (Signed)
Patient ID: Dean [name], male   DOB: 09-08-1924, 78 y.o.   MRN: [id number] Patient ID: Dean [name], male   DOB: July 16, 1924, 78 y.o.   MRN: [id number] Subjective/Complaints: Review of Systems  Constitutional: Positive for malaise/fatigue.  Respiratory: Positive for cough.   Psychiatric/Behavioral: The patient has insomnia.   All other systems reviewed and are negative.  1/31--Sleeping better. Complains of dry nose.    Objective: Vital Signs: Blood pressure 114/68, pulse 97, temperature 98.4 F (36.9 C), temperature source Oral, resp. rate 18, weight 84.913 kg (187 lb 3.2 oz), SpO2 93.00%. No results found.  Basename 11/04/11 0635 11/03/11 0844  WBC 5.3 6.1  HGB 9.8* 10.3*  HCT 30.5* 31.8*  PLT 393 409*    Basename 11/04/11 0635 11/03/11 0844  NA 139 137  K 3.5 3.4*  CL 101 98  CO2 31 30  GLUCOSE 105* 105*  BUN 13 11  CREATININE 0.94 1.04  CALCIUM 8.4 8.2*   CBG (last 3)  No results found for this basename: GLUCAP:3 in the last 72 hours  Wt Readings from Last 3 Encounters:  11/05/11 84.913 kg (187 lb 3.2 oz)  10/31/11 84.3 kg (185 lb 13.6 oz)  10/31/11 84.3 kg (185 lb 13.6 oz)    Physical Exam:  General appearance: alert, cooperative and mild distress Head: Normocephalic, without obvious abnormality, atraumatic Eyes: conjunctivae/corneas clear. PERRL, EOM's intact. Fundi benign. Ears: normal TM's and external ear canals both ears Nose: Nares normal. Septum midline. Mucosa normal. No drainage or sinus tenderness. Throat: lips, mucosa, and tongue normal; teeth and gums normal Neck: no adenopathy, no carotid bruit, no JVD, supple, symmetrical, trachea midline and thyroid not enlarged, symmetric, no tenderness/mass/nodules Back: symmetric, no curvature. ROM normal. No CVA tenderness. Resp: diminished breath sounds RLL otherwise breathing unlabored.  Wearing o2 Cardio: regular rate and rhythm, S1, S2 normal, no murmur, click, rub or gallop GI: soft, non-tender;  bowel sounds normal; no masses,  no organomegaly Extremities: extremities normal, atraumatic, no cyanosis or edema except legs 1+ bilaterally Pulses: 2+ and symmetric Skin: Skin color, texture, turgor normal. No rashes or lesions Neurologic: Grossly normal Incision/Wound: chest wounds clean and intact with steristrips Exam 1/31  Assessment/Plan: 1. Functional deficits secondary to deconditioning from empyema/pna--s/p VATS which require 3+ hours per day of interdisciplinary therapy in a comprehensive inpatient rehab setting. Physiatrist is providing close team supervision and 24 hour management of active medical problems listed below. Physiatrist and rehab team continue to assess barriers to discharge/monitor patient progress toward functional and medical goals. FIM: FIM - Bathing Bathing Steps Patient Completed: Chest;Right Arm;Left Arm;Front perineal area;Buttocks;Abdomen;Right upper leg;Left upper leg;Right lower leg (including foot);Left lower leg (including foot) Bathing: 5: Supervision: Safety issues/verbal cues  FIM - Upper Body Dressing/Undressing Upper body dressing/undressing steps patient completed: Thread/unthread right sleeve of pullover shirt/dresss;Put head through opening of pull over shirt/dress;Pull shirt over trunk;Thread/unthread left sleeve of pullover shirt/dress Upper body dressing/undressing: 5: Supervision: Safety issues/verbal cues FIM - Lower Body Dressing/Undressing Lower body dressing/undressing steps patient completed: Thread/unthread right pants leg;Pull pants up/down;Don/Doff right sock;Don/Doff left sock Lower body dressing/undressing: 4: Min-Patient completed 75 plus % of tasks  FIM - Toileting Toileting steps completed by patient: Adjust clothing prior to toileting;Adjust clothing after toileting;Performs perineal hygiene Toileting: 0: Activity did not occur  FIM - Radio producer Devices: Elevated toilet seat Toilet  Transfers: 0-Activity did not occur  FIM - Control and instrumentation engineer Devices: Arm rests Bed/Chair Transfer: 5: Supine > Sit:  Supervision (verbal cues/safety issues);5: Sit > Supine: Supervision (verbal cues/safety issues);5: Bed > Chair or W/C: Supervision (verbal cues/safety issues)  FIM - Locomotion: Wheelchair Locomotion: Wheelchair: 1: Total Assistance/staff pushes wheelchair (Pt<25%) FIM - Locomotion: Ambulation Ambulation/Gait Assistance: 4: Min assist (progressed to supervision) Locomotion: Ambulation: 2: Travels 50 - 149 ft with minimal assistance (Pt.>75%)  Comprehension Comprehension Mode: Auditory Comprehension: 7-Follows complex conversation/direction: With no assist  Expression Expression Mode: Verbal Expression: 7-Expresses complex ideas: With no assist  Social Interaction Social Interaction: 7-Interacts appropriately with others - No medications needed.  Problem Solving Problem Solving: 7-Solves complex problems: Recognizes & self-corrects  Memory Memory: 7-Complete Independence: No helper  1. DVT Prophylaxis/Anticoagulation: Pharmaceutical: Coumdin . Lovenox till INR therapeutic.  2. Pain Management: Prn percocet for chest wall/rib pain. Can also utilize ice and/or heat.  Lidoderm patches helpful.. States that current regimen is helping pain.  3. Mood: monitor for now.  4. MRSA empyema/PNA: Continue zyvox thorough 02/3. Contact precautions. Guaifenesin for cough 5. Afib with RVR: Monitor HR with bid checks. Continue cardizem-off amiodarone. HR controlled 6. Fluid overload with third spacing: Low salt diet. Off diuretics.   daily weights stable at 84kg.  7. Hypokalemia: Likely due to diuretics. Continue supplement and monitor routinely. Need to keep supplemented to avoid arrthymias as well as recurrence of ileus. Has good appetite. Recheck tomorrow. 8. Essential thrombocytopenia: resume hydrea.  9. Gout: Resume allopurinol.  9. Cervicalgia:  uses Voltaren gel prn.  10. Asthma with nocturnal hypoxia: Continue nebs qid. Oxygen per [place] to keep sats >90%. Wean oxygen if able.  Saline spray for nose   LOS (Days) 3 A FACE TO FACE EVALUATION WAS PERFORMED  Marialuisa Basara T 11/06/2011, 7:21 AM

## 2011-11-07 DIAGNOSIS — J159 Unspecified bacterial pneumonia: Secondary | ICD-10-CM

## 2011-11-07 DIAGNOSIS — R5381 Other malaise: Secondary | ICD-10-CM

## 2011-11-07 DIAGNOSIS — J860 Pyothorax with fistula: Secondary | ICD-10-CM

## 2011-11-07 DIAGNOSIS — Z5189 Encounter for other specified aftercare: Secondary | ICD-10-CM

## 2011-11-07 LAB — BASIC METABOLIC PANEL
BUN: 11 mg/dL (ref 6–23)
Calcium: 8.5 mg/dL (ref 8.4–10.5)
Creatinine, Ser: 0.94 mg/dL (ref 0.50–1.35)
GFR calc non Af Amer: 74 mL/min — ABNORMAL LOW (ref >90)
Glucose, Bld: 92 mg/dL (ref 70–99)

## 2011-11-07 LAB — CBC
MCH: 31.2 pg (ref 26.0–34.0)
MCHC: 32.1 g/dL (ref 30.0–36.0)
Platelets: 325 10*3/uL (ref 150–400)
RDW: 15.3 % (ref 11.5–15.5)

## 2011-11-07 MED ORDER — WARFARIN SODIUM 5 MG PO TABS: 7.5000 mg | ORAL_TABLET | Freq: Once | ORAL | Status: DC

## 2011-11-07 MED ORDER — CALCIUM POLYCARBOPHIL 625 MG PO TABS: 1250.0000 mg | ORAL_TABLET | Freq: Every day | ORAL | Status: AC

## 2011-11-07 MED ORDER — LINEZOLID 600 MG PO TABS: 600.0000 mg | ORAL_TABLET | Freq: Two times a day (BID) | ORAL | Status: AC

## 2011-11-07 MED ORDER — WARFARIN SODIUM 5 MG PO TABS: 5.0000 mg | ORAL_TABLET | Freq: Once | ORAL | Status: DC

## 2011-11-07 MED ORDER — SACCHAROMYCES BOULARDII 250 MG PO CAPS: 500.0000 mg | ORAL_CAPSULE | Freq: Two times a day (BID) | ORAL | Status: AC

## 2011-11-07 MED ORDER — LIDOCAINE 5 % EX PTCH: MEDICATED_PATCH | CUTANEOUS | Status: DC

## 2011-11-07 MED ORDER — OXYCODONE-ACETAMINOPHEN 5-325 MG PO TABS: 1.0000 | ORAL_TABLET | Freq: Three times a day (TID) | ORAL | Status: AC | PRN

## 2011-11-07 MED ORDER — WARFARIN SODIUM 7.5 MG PO TABS
7.5000 mg | ORAL_TABLET | Freq: Once | ORAL | Status: AC
  Administered 2011-11-07: 7.5 mg via ORAL
  Filled 2011-11-07: qty 1

## 2011-11-07 NOTE — Discharge Planning (Signed)
Social Work  Discharge Note  The overall goal for the admission was met for:   Discharge location: Yes - home with wife  Length of Stay: Yes - 5 days (w/ 11/08/11 d/c)  Discharge activity level: Yes - modified independent  Home/community participation: Yes  Services provided included: MD, RD, PT, OT, RN, CM, TR, Pharmacy and [place]: Medicare and Private Insurance: [place]  Follow-up services arranged: Home Health: RN, PT, and OT via [facility], DME: rolling walker, 3n1 commode and tub bench via [facility] and Patient/Family has no preference for HH/DME agencies  Comments (or additional information):  Patient/Family verbalized understanding of follow-up arrangements: Yes  Individual responsible for coordination of the follow-up plan: patient  Confirmed correct DME delivered: Yuri Flener 11/07/2011    Izaya Netherton

## 2011-11-07 NOTE — Progress Notes (Signed)
Pt reported difficulty breathing, and requested nebulizer trmt. 02 sat at 97%. Respiratory therapist called to administer nebulizer trmt. On coming nurse to continue to monitor.

## 2011-11-07 NOTE — Progress Notes (Signed)
Subjective/Complaints: Review of Systems  Constitutional: Positive for malaise/fatigue.  Respiratory: Positive for cough.   Psychiatric/Behavioral: The patient has insomnia.   All other systems reviewed and are negative.  2/1--no new problems. Slept well    Objective: Vital Signs: Blood pressure 125/71, pulse 96, temperature 97.8 F (36.6 C), temperature source Oral, resp. rate 20, weight 84.913 kg (187 lb 3.2 oz), SpO2 96.00%. No results found. No results found for this basename: WBC:2,HGB:2,HCT:2,PLT:2 in the last 72 hours No results found for this basename: NA:2,K:2,CL:2,CO2:2,GLUCOSE:2,BUN:2,CREATININE:2,CALCIUM:2 in the last 72 hours CBG (last 3)  No results found for this basename: GLUCAP:3 in the last 72 hours  Wt Readings from Last 3 Encounters:  11/05/11 84.913 kg (187 lb 3.2 oz)  10/31/11 84.3 kg (185 lb 13.6 oz)  10/31/11 84.3 kg (185 lb 13.6 oz)    Physical Exam:  General appearance: alert, cooperative and mild distress Head: Normocephalic, without obvious abnormality, atraumatic Eyes: conjunctivae/corneas clear. PERRL, EOM's intact. Fundi benign. Ears: normal TM's and external ear canals both ears Nose: Nares normal. Septum midline. Mucosa normal. No drainage or sinus tenderness. Throat: lips, mucosa, and tongue normal; teeth and gums normal Neck: no adenopathy, no carotid bruit, no JVD, supple, symmetrical, trachea midline and thyroid not enlarged, symmetric, no tenderness/mass/nodules Back: symmetric, no curvature. ROM normal. No CVA tenderness. Resp: diminished breath sounds RLL otherwise breathing unlabored.  Wearing o2 [place] Cardio: regular rate and rhythm, S1, S2 normal, no murmur, click, rub or gallop GI: soft, non-tender; bowel sounds normal; no masses,  no organomegaly Extremities: extremities normal, atraumatic, no cyanosis or edema except legs trace to 1+ bilaterally Pulses: 2+ and symmetric Skin: Skin color, texture, turgor normal. No rashes or  lesions Neurologic: Grossly normal Incision/Wound: chest wounds clean and intact with steristrips Exam 2/1  Assessment/Plan: 1. Functional deficits secondary to deconditioning from empyema/pna--s/p VATS which require 3+ hours per day of interdisciplinary therapy in a comprehensive inpatient rehab setting. Physiatrist is providing close team supervision and 24 hour management of active medical problems listed below. Physiatrist and rehab team continue to assess barriers to discharge/monitor patient progress toward functional and medical goals. FIM: FIM - Bathing Bathing Steps Patient Completed: Chest;Right Arm;Left Arm;Front perineal area;Buttocks;Abdomen;Right upper leg;Left upper leg;Right lower leg (including foot);Left lower leg (including foot) Bathing: 0: Activity did not occur  FIM - Upper Body Dressing/Undressing Upper body dressing/undressing steps patient completed: Thread/unthread right sleeve of pullover shirt/dresss;Put head through opening of pull over shirt/dress;Pull shirt over trunk;Thread/unthread left sleeve of pullover shirt/dress Upper body dressing/undressing: 0: Activity did not occur FIM - Lower Body Dressing/Undressing Lower body dressing/undressing steps patient completed: Thread/unthread right pants leg;Pull pants up/down;Don/Doff right sock;Don/Doff left sock Lower body dressing/undressing: 0: Activity did not occur  FIM - Toileting Toileting steps completed by patient: Adjust clothing prior to toileting;Performs perineal hygiene;Adjust clothing after toileting Toileting Assistive Devices: Grab bar or rail for support Toileting: 5: Supervision: Safety issues/verbal cues  FIM - Radio producer Devices: Elevated toilet seat Toilet Transfers: 5-To toilet/BSC: Supervision (verbal cues/safety issues)  FIM - Control and instrumentation engineer Devices: Arm rests;Walker Bed/Chair Transfer: 0: Activity did not occur  FIM -  Locomotion: Wheelchair Locomotion: Wheelchair: 0: Activity did not occur FIM - Locomotion: Ambulation Locomotion: Ambulation Assistive Devices: Administrator Ambulation/Gait Assistance: 4: Min assist (progressed to supervision) Locomotion: Ambulation: 5: Travels 150 ft or more with supervision/safety issues  Comprehension Comprehension Mode: Auditory Comprehension: 7-Follows complex conversation/direction: With no assist  Expression Expression Mode: Verbal Expression: 7-Expresses  complex ideas: With no assist  Social Interaction Social Interaction: 7-Interacts appropriately with others - No medications needed.  Problem Solving Problem Solving: 7-Solves complex problems: Recognizes & self-corrects  Memory Memory: 7-Complete Independence: No helper  1. DVT Prophylaxis/Anticoagulation: Pharmaceutical: Coumdin . Lovenox till INR therapeutic.  2. Pain Management: Prn percocet for chest wall/rib pain. Can also utilize ice and/or heat.  Lidoderm patches helpful.. States that current regimen is helping pain.  3. Mood: monitor for now.  4. MRSA empyema/PNA: Continue zyvox thorough 02/3. Contact precautions. Guaifenesin for cough 5. Afib with RVR: Monitor HR with bid checks. Continue cardizem-off amiodarone. HR controlled 6. Fluid overload with third spacing: Low salt diet. Off diuretics.   daily weights stable at 84kg.  7. Hypokalemia: Likely due to diuretics. Continue supplement and monitor routinely. Need to keep supplemented to avoid arrthymias as well as recurrence of ileus. Has good appetite. bmet today 8. Essential thrombocytopenia: resume hydrea.  9. Gout: Resume allopurinol.  9. Cervicalgia: uses Voltaren gel prn.  10. Asthma with nocturnal hypoxia: Continue nebs qid. Oxygen per [place] to keep sats >90%. Wean oxygen if able.  Saline spray for nose  -would plan on home  oxygen   LOS (Days) 4 A FACE TO FACE EVALUATION WAS PERFORMED  Dean [name] 11/07/2011, 6:54 AM

## 2011-11-07 NOTE — Progress Notes (Signed)
Occupational Therapy Session Notes &  Discharge Summary  Patient Details  Name: Dean [name] MRN: [id number] Date of Birth: Nov 13, 1923 Today's Date: 11/07/2011  SESSION NOTES  Session #1 [id number] - 88 Minutes Individual Therapy No complaints of pain Engaged in ADL retraining at shower level. Focused skilled intervention on functional ambulation/mobility throughout room using rolling walker, increasing overall activity tolerance/endurance, UB/LB bathing in sit to stand position using tub transfer bench, UB/LB dressing in sit -> stand position edge of bed, and grooming tasks standing at sink. Patient's wife and daughter were present towards end of session. Educated wife on safety throughout house, patients functional mobility status, edema & swelling throughout bilateral LEs, and shower & toilet transfers. Recommend BSC (3-in-1) and Tub Transfer Bench for home use. Patient and Patient's wife agreeable.   Session #2 [id number] - 30 Minutes Individual Therapy Patient with complaints of "soreness", declined any pain medication Treatment focus on simulated tub shower transfer onto tub transfer bench. Patient able to complete transfer with supervision in/out of tub. Made patient modified independent within room for last evening/ night on CIR. Patient excited to discharge home tomorrow, "it's been hard not being able to rest for 30 days!".    DISCHARGE SUMMARY  Patient has met 9 of 9 long term goals due to improved activity tolerance, improved balance, functional use of  RIGHT upper and LEFT upper extremity and improved coordination.  Patient's care partner is independent to provide the necessary intermittent supervision prn at discharge. Patient plans to discharge -> home with wife.  Recommendation:  Recommend additional Home Health OT for functional use of bilateral use of UEs and to increase overall activity tolerance/endurance & strength.   Equipment: Equipment provided: [facility] and Tub Surveyor, mining agrees with progress made and goals achieved: Yes  [name] 11/07/2011, 9:33 AM

## 2011-11-07 NOTE — Progress Notes (Signed)
ANTICOAGULATION CONSULT NOTE  Pharmacy Consult:  Coumadin Indication: h/o AFib  No Known Allergies  Patient Measurements: Weight: 187 lb 3.2 oz (84.913 kg)  Vital Signs: Temp: 97.8 F (36.6 C) (02/01 0528) Temp src: Oral (02/01 0528) BP: 125/71 mmHg (02/01 0528) Pulse Rate: 96  (02/01 0528)  Labs:  Basename 11/07/11 0630 11/06/11 0615 11/05/11 1217  HGB 9.2* -- --  HCT 28.7* -- --  PLT 325 -- --  APTT -- -- --  LABPROT 17.9* 15.4* 15.2  INR 1.45 1.19 1.18  HEPARINUNFRC -- -- --  CREATININE 0.94 -- --  CKTOTAL -- -- --  CKMB -- -- --  TROPONINI -- -- --   The CrCl is unknown because both a height and weight (above a minimum accepted value) are required for this calculation.  Medical History: Past Medical History  Diagnosis Date  . PAF (paroxysmal atrial fibrillation)     followed by [place] cardiology  . Aortic valve disease     followed by [facility]  . Prostate cancer     s/p seed implants  . Environmental allergies   . Asthma   . Essential thrombocytopenia     followed by Dr Bobby Rumpf (hematology)  . Neuropathy   . Insomnia   . Gout   . Obesity   . Hypertension   . Hyperlipidemia   . Osteoarthritis   . Hypoxia, sleep related     Assessment: s/p VATS and drainage of empyema 10/23/11. 65 YOM with hx of Afib on Coumadin PTA (5mg  on Tues, Fri and 4mg  on Mon, Wed, Thurs, Sat, Sun.)  Bridge with Lovenox 40mg /d until [employer].  INR subtherapeutic, no bleeding noted.   Goal of Therapy:  INR 2-3    Plan:  - Continue warfarin 7.5 mg po x1 tonight (expect INR to increase further tomorrow, so may need to decrease dose) - Daily INR    [facility], Pharm D Candidate Mendel Corning D 11/07/2011 2:23 PM

## 2011-11-07 NOTE — Progress Notes (Signed)
Physical Therapy Note  Patient Details  Name: Dean [name] MRN: [id number] Date of Birth: 27-Sep-1924 Today's Date: 11/07/2011  PT Treatment Note TIME IN/OUT  1349-1459 60 minutes  Skilled intervention: Gait training- 10 m walk test 20.2 sec (neighborhood ambulator) with RW; gait mod I with divided attention task/conversation  Therapeutic exercise performed with LE to increase strength for functional mobility with 3# weights standing hip flexion, abduction, knee flexion, seated LAQ.  UE exercise with 2 lb weight for rotator cuff, bicep, rhomboids and deltoids, shoulder retractions with passive ROM to very tight scapula, hot pack for pain control d/t c/o soreness, declined pain meds but requested back to bed after session to ease pain , next therapist informed  Up/down 12 steps with rails for LE strengthening, HR 114 and O2 on RA 96% thruout session Frequent seated rest breaks required d/t impaired activity tolerance.  Pt reports neck and shoulder problems that he was going to request PT for prior to this hospitalization, declined neck A/ROM   Individual therapy     Othelia Pulling 11/07/2011, 2:51 PM

## 2011-11-07 NOTE — Progress Notes (Signed)
Physical Therapy Discharge Summary & Treatment Note  Patient Details  Name: Dean [name] MRN: [id number] Date of Birth: 10-18-1923 Today's Date: 11/07/2011  Treatment #1: 1015-1055 (40 minutes) Denies pain. Room air O2 sats remained > 93% with activity. Family education complete with pt's wife and daughter regarding safety, bed mobility, basic transfers, gait, car transfers, and steps with rails and with RW for curb step for home entry. Pt at overall modified independent with RW and supervision with steps.   Patient has met 8 of 8 long term goals due to improved activity tolerance, improved balance and increased strength.  Patient to discharge at an ambulatory level Modified Independent with RW.   Patient's care partner is independent to provide the necessary supervision assistance at discharge. Family education with pt's wife and daughter successfully completed.   Recommendation:  Patient will benefit from ongoing skilled PT services in home health setting to continue to advance safe functional mobility, address ongoing impairments in activity tolerance, strength, and minimize fall risk.  Equipment: Equipment provided: RW  Patient/family agrees with progress made and goals achieved: Yes  Allayne Gitelman 11/07/2011, 4:32 PM

## 2011-11-08 LAB — PROTIME-INR: Prothrombin Time: 19.6 seconds — ABNORMAL HIGH (ref 11.6–15.2)

## 2011-11-08 MED ORDER — WARFARIN SODIUM 7.5 MG PO TABS
7.5000 mg | ORAL_TABLET | Freq: Once | ORAL | Status: AC
  Administered 2011-11-08: 7.5 mg via ORAL
  Filled 2011-11-08: qty 1

## 2011-11-08 NOTE — Progress Notes (Signed)
@   1945 patient complained of feeling SOB, O2 sat 97% with O2 @ 2l/m via [place]. Respiratory giving patient nebulizer treatment. Coletta Memos

## 2011-11-08 NOTE — Progress Notes (Signed)
ANTICOAGULATION CONSULT NOTE - Follow Up Consult  Pharmacy Consult for Warfarin Indication: atrial fibrillation  No Known Allergies  Patient Measurements: Weight: 187 lb 3.2 oz (84.913 kg) Heparin Dosing Weight:    Vital Signs: Temp: 97.7 F (36.5 C) (02/02 0700) Temp src: Oral (02/02 0700) BP: 106/71 mmHg (02/02 0700) Pulse Rate: 82  (02/02 0700)  Labs:  Basename 11/08/11 0545 11/07/11 0630 11/06/11 0615  HGB -- 9.2* --  HCT -- 28.7* --  PLT -- 325 --  APTT -- -- --  LABPROT 19.6* 17.9* 15.4*  INR 1.63* 1.45 1.19  HEPARINUNFRC -- -- --  CREATININE -- 0.94 --  CKTOTAL -- -- --  CKMB -- -- --  TROPONINI -- -- --   The CrCl is unknown because both a height and weight (above a minimum accepted value) are required for this calculation.   Medications:  Scheduled:    . allopurinol  300 mg Oral Daily  . diltiazem  180 mg Oral Daily  . enoxaparin  40 mg Subcutaneous Daily  . hydroxyurea  500 mg Oral Q48H  . levalbuterol  0.63 mg Nebulization BID  . lidocaine  1 patch Transdermal Q24H  . linezolid  600 mg Oral Q12H  . pantoprazole  40 mg Oral QHS  . polycarbophil  1,250 mg Oral Daily  . pregabalin  50 mg Oral 2 days  . saccharomyces boulardii  500 mg Oral BID  . simvastatin  20 mg Oral q1800  . Tamsulosin HCl  0.4 mg Oral Daily  . warfarin  7.5 mg Oral ONCE-1800   Infusions:    Assessment: s/p VATS and drainage of empyema 10/23/11. 36 YOM with hx of Afib on Coumadin PTA (5mg  on Tues, Fri and 4mg  on Mon, Wed, Thurs, Sat, Sun.)   Anticoagulation: INR trending up 1.63, lovenox 40mg /d until INR at goal (2-3), no bleeding noted. Hgb continues to decline.  Infectious Disease: MRSA PNA/empyema s/p VATS: plan for 3wks zyvox - through 11/10/11, afebrile, WBC 4.6. Afebrile. Have to watch for linezolid induced myelosuppression.   Cardiovascular: chronic warfarin PTA for afib with RVR (home dose 4 mg M/W/R/S/S, 5 mg T/F). VSS - on diltiazem, Zocor  Endocrinology: no h/o DM -  CBGs well controlled  Gastrointestinal / Nutrition: regular diet, on PPI  Neurology: pain management ok: prn percocet, lidoderm patch, Lyrica  Nephrology: renal function stable, lytes WNL  Pulmonary: asthma with nocturnal hypoxia, 95% on 2L [place] - on Xopenex BID  Hematology / Oncology: esential thrombocytopenia - resumed Hydrea. hgb 9.2, plts ok 325  PTA Medication Issues: 81 mg ASA not resumed  Best Practices: Lovenox, Coumadin, PPI PO   Goal of Therapy:  INR 2-3   Plan:  - Continue warfarin 7.5 mg po x1 tonight  - Daily INR   Wayland Salinas 11/08/2011,7:23 AM

## 2011-11-08 NOTE — Discharge Summary (Signed)
Dean [name], Dean [name] NO.:  [id number]  MEDICAL RECORD NO.:  [id number]  LOCATION:  [id number]                         FACILITY:  [facility]  PHYSICIAN:  Dean [name], M.D.DATE OF BIRTH:  1923-12-11  DATE OF ADMISSION:  11/03/2011 DATE OF DISCHARGE:                              DISCHARGE SUMMARY   DISCHARGE DIAGNOSES: 1. Methicillin-resistant Staphylococcus aureus pneumonia with empyema     requiring VATS. 2. Deconditioning due to prior procedure. 3. Atrial fibrillation. 4. Fluid overload, resolving. 5. Hypokalemia, resolved. 6. Essential thrombocytopenia. 7. Anemia.  HISTORY OF PRESENT ILLNESS:  [name] is an 78 year old male with history of PAF asthma initially admitted to [facility] on October 08, 2011, and was diagnosed with MRSA pneumonia with progressive pleural effusion.  He underwent thoracocentesis on October 14, 2011, and was ultimately transferred to [facility] on October 16, 2011, for treatment of worsening right-sided airspace disease with increased AFib with RVR and progressive leukocytosis.  He was treated with Cardizem and IV amiodarone and continued on IV antibiotics.  A 2D echo done showed ejection fraction 55% with preserved systolic function and mild aortic stenosis.  The patient developed large right pleural effusion with complete right lung collapse requiring chest tube incision by Dr. Titus [name] on October 20, 2011.  He was placed on linezolid and imipenem for sepsis due to PNA and probably possible colitis.  He required BiPAP for respiratory support.  The patient with persistent moderate to large loculated pleural effusion collection suggesting empyema and required right VATS with decortication and drainage of empyema on October 23, 2011, by Dr. Cyndia [name].  Chest tubes discontinued on October 28, 2011. Postprocedure ileus has resolved and diet is slowly being advanced. Fluid overload treated with diuretics.  Recommendations  were to treat his MRSA PNA and empyema with 3 weeks of Zyvox.  Therapies were initiated and the patient is noted to be deconditioned.  Rehab was consulted for progressive therapies.  REVIEW OF SYSTEMS:  Positive for hearing loss and neck pain.  Positive for cough, shortness of breath.  Positive for right chest wall pain with deep breaths as well as peripheral edema.  Negative for abdominal pain or constipation. Negative for visual deficits.  Negative for depression or anxiety.  Does report has issues with hesitancy.  PAST MEDICAL HISTORY:  Positive for PAF, aortic valve disease, prostate cancer treated with seed implants, environmental allergies, asthma with nocturnal hypoxia, neuropathy, insomnia, gout, obesity, hypertension, hyperlipidemia and OA.  PAST SURGICAL HISTORY:  Lap chole excision of basal cell cancers, excision of cataracts with intraocular implants, tonsillectomy and adenoidectomy excision of mass from back and excision of nasal polyps.  FAMILY HISTORY:  Positive for coronary artery disease in his father and Alzheimer disease in mother.  SOCIAL HISTORY:  The patient is married.  He is a retired Dealer.  He reports he never smoked.  He does not use any smokeless tobacco.  Does not use any alcohol or drugs.  Wife in good health and can provide assistance past discharge.  ALLERGIES:  No known drug allergies.  HOME: 1 level with 1 step at entry.  No rails.  FUNCTIONAL HISTORY:  The patient was independent with  basic ADLs, independent with home making transfers as well as gait.  He was still driving.  FUNCTIONAL STATUS:  The patient is mod to max assist for bed mobility, + 2 total assist 60% for transfers, +2 total assist 80% for taking pivoting steps with poor posture and decreased stride length.  PHYSICAL EXAMINATION:  VITALS:  Blood pressure 102/65, pulse 81, temperature 98.1 and respirations 18. GENERAL:  The patient is a well-nourished, well-developed,  elderly male, alert and oriented x3.  HEENT:  Atraumatic, normocephalic.  Pupils equal, round and reactive to light.  Nares patent. NECK:  Supple with normal range of motion. CARDIOVASCULAR:  Normal rate and appears to have regular rhythm. PULMONARY:  The patient with normal effort.  Decreased breath sounds right lower lobe and right middle lobe. ABDOMEN:  Soft, nontender, mild distention noted. MUSCULOSKELETAL:  Edema 1+ bilateral lower greater than bilateral upper extremity with some tenting noted. NEUROLOGIC:  The patient is alert and oriented x3.  He is hard of hearing, otherwise cranial nerves grossly intact.  Strength in upper extremity is grossly 4/5 proximal to 4+/5 distally.  Lower extremity strength is grossly 2- to 2+/5 at the hip flexures, 3/5 knee extension, 4/5 ankle dorsiflexion and plantar flexion.  Reflexes 1+.  Sensation grossly intact in all 4 limbs with some diminishment, probably due to edema in legs. SKIN:  Warm and dry.  Right thoracotomy incision is noted to be healing well without any drainage.  HOSPITAL COURSE:  [name] was admitted to rehab on November 03, 2011, for inpatient therapies to consist of PT, OT at least 3 hours 5 days a week.  Past admission, physiatrist, rehab, RN and therapy team have worked together to provide customized collaborative interdisciplinary care.  The patient was maintained on a low-salt diet to help with his fluid overload issue.  He was maintained on a Zyvox for treatment of his empyema and is to continue on this through November 08, 2011.  Labs were done past admission for followup.  No evidence of leukocytosis noted.  Check of lytes showed hypokalemia to have resolved. Most recent labs done prior to discharge on November 07, 2011, reveals sodium 139, potassium 3.6, chloride 103, CO2 27, BUN 11, creatinine 0.94, glucose 92.  The followup BNP shows improvement at 8844.3.  CBC checked revealed H and H at 9.2 and 28.7 and  white count at 4.6. Platelets stable at 325.  Past admission therapy evaluation was done revealed the patient with significant deconditioning affecting his basic mobility and self-care. He required min assist with all self-care needs due to decreased cardiorespiratory endurance.  Physical therapy evaluation revealed the patient requiring min assist for mobility.  PT, OT has worked with the patient with focus on functional ambulation and mobility as well as increasing overall activity tolerance and endurance.  By time of discharge, the patient was modified independent for ADL tasks.  He does require supervision for tub and shower transfers.  His overall activity tolerance has greatly improved.  Currently, the patient is ambulating at modified independent level.  He is able to ambulate 160 feet x2 without assistive device with min assist to close supervision to improve protective balance reaction.  He is able to ambulate up to 160 feet with rolling walker safely.  Further followup home health physical therapy to continue for progression past discharge.  On November 08, 2011, the patient is discharged to home.  DISCHARGE MEDICATIONS: 1. Lidocaine patch to right chest wall on at 8 a.m., off at  8 p.m.     daily. 2. Zyvox 600 mg p.o. b.i.d. 3. Percocet 5-325, #30, 1-2 p.o. q.8 h. p.r.n. moderate-to-severe     pain. 4. FiberCon 2 tabs p.o. per day. 5. Florastor 250 mg 2 capsules p.o. b.i.d. 6. Coumadin 5 mg 1.5 p.o. q.p.m. for now. 7. Tylenol 500 mg p.o. every other day. 8. Albuterol nebs b.i.d. 9. Allopurinol 300 mg p.o. per day. 10.Coated aspirin 81 mg p.o. per day. 11.Astelin nasal spray 1 squirt in each nostril b.i.d. 12.Pulmicort 0.25 mg nebulized b.i.d. 13.Voltaren gel topically as needed. 14.Cardizem CD 180 mg p.o. per day. 15.Hydrea 500 mg p.o. every other day. 16.Fish oil tabs 1 p.o. per day. 17.Lyrica 50 mg p.o. every other day. 18.Zocor 20 mg p.o. per day. 19.Flomax 0.4 mg  p.o. per day. 20.Ambien 5 mg p.o. at bedtime p.r.n.  DIET INSTRUCTION:  Low salt, with diabetic restrictions.  ACTIVITY LEVEL:  At intermittent supervision.  Walk with walker.  No alcohol.  No lifting, driving or strenuous activity for a couple of weeks.  SPECIAL INSTRUCTIONS:  [facility] to provide PT, OT and RN.  FOLLOWUP:  The patient to follow up with Dr. Lin Landsman in 2 weeks for post hospital checkup and for management of Coumadin.  Follow up with Dr. Gilford Raid for postop checkup in next couple of weeks.  Follow up with Dr. Naaman Plummer as needed.     Reesa Chew, P.A.   ______________________________ Dean [name], M.D.    PL/MEDQ  D:  11/07/2011  T:  11/08/2011  Job:  [id number]  cc:   Gilford Raid, M.D. Lovette Cliche II, M.D. Brand Males, MD

## 2011-11-08 NOTE — Progress Notes (Signed)
Patient ID: Dean [name], male   DOB: 1924/09/13, 78 y.o.   MRN: [id number] Subjective/Complaints: Review of Systems  Constitutional: Positive for malaise/fatigue.  Respiratory: Positive for cough.   Psychiatric/Behavioral: The patient has insomnia.   All other systems reviewed and are negative.  2/2.  Patient doing quite well today. His only complaint is nasal stuffiness. He is anxious for discharge. INR slightly subtherapeutic at 1.63. Capillary blood sugars remain nicely controlled. He is ambulatory with a walker. Blood pressure remains well-controlled and a low normal range .  Apparently one of his medications is a $400 co-pay.  We'll investigate further and try to find a less expensive alternative. Examination unchanged. ENT examination unremarkable mucous membranes pink. Chest clear to auscultation rashes and revealed a grade 3/6 systolic murmur. No peripheral edema    Objective: Vital Signs: Blood pressure 106/71, pulse 82, temperature 97.7 F (36.5 C), temperature source Oral, resp. rate 18, weight 187 lb 3.2 oz (84.913 kg), SpO2 96.00%. No results found.  Basename 11/07/11 0630  WBC 4.6  HGB 9.2*  HCT 28.7*  PLT 325    Basename 11/07/11 0630  NA 139  K 3.6  CL 103  CO2 27  GLUCOSE 92  BUN 11  CREATININE 0.94  CALCIUM 8.5   CBG (last 3)  No results found for this basename: GLUCAP:3 in the last 72 hours  Wt Readings from Last 3 Encounters:  11/05/11 187 lb 3.2 oz (84.913 kg)  10/31/11 185 lb 13.6 oz (84.3 kg)  10/31/11 185 lb 13.6 oz (84.3 kg)    BP Readings from Last 3 Encounters:  11/08/11 106/71  11/03/11 108/64  11/03/11 108/64    Physical Exam:  General appearance: alert, cooperative and mild distress Head: Normocephalic, without obvious abnormality, atraumatic Eyes: conjunctivae/corneas clear. PERRL, EOM's intact. Fundi benign. Ears: normal TM's and external ear canals both ears Nose: Nares normal. Septum midline. Mucosa normal. No drainage or  sinus tenderness. Throat: lips, mucosa, and tongue normal; teeth and gums normal Neck: no adenopathy, no carotid bruit, no JVD, supple, symmetrical, trachea midline and thyroid not enlarged, symmetric, no tenderness/mass/nodules Back: symmetric, no curvature. ROM normal. No CVA tenderness. Resp: diminished breath sounds RLL otherwise breathing unlabored.  Wearing o2 [place] Cardio: regular rate and rhythm, S1, S2 normal, no murmur, click, rub or gallop GI: soft, non-tender; bowel sounds normal; no masses,  no organomegaly Extremities: extremities normal, atraumatic, no cyanosis or edema except legs trace to 1+ bilaterally Pulses: 2+ and symmetric Skin: Skin color, texture, turgor normal. No rashes or lesions Neurologic: Grossly normal Incision/Wound: chest wounds clean and intact with steristrips Exam 2/1  Assessment/Plan: 1. Functional deficits secondary to deconditioning from empyema/pna--s/p VATS which require 3+ hours per day of interdisciplinary therapy in a comprehensive inpatient rehab setting. Physiatrist is providing close team supervision and 24 hour management of active medical problems listed below. Physiatrist and rehab team continue to assess barriers to discharge/monitor patient progress toward functional and medical goals. FIM: FIM - Bathing Bathing Steps Patient Completed: Chest;Right Arm;Left Arm;Abdomen;Buttocks;Right upper leg;Left upper leg;Left lower leg (including foot);Front perineal area;Right lower leg (including foot) Bathing: 6: Assistive device (Comment)  FIM - Upper Body Dressing/Undressing Upper body dressing/undressing steps patient completed: Thread/unthread right sleeve of pullover shirt/dresss;Thread/unthread left sleeve of pullover shirt/dress;Put head through opening of pull over shirt/dress;Pull shirt over trunk Upper body dressing/undressing: 7: Complete Independence: No helper FIM - Lower Body Dressing/Undressing Lower body dressing/undressing steps  patient completed: Thread/unthread right pants leg;Thread/unthread left pants leg;Pull pants up/down  Lower body dressing/undressing: 6: Assistive device (Comment)  FIM - Toileting Toileting steps completed by patient: Adjust clothing prior to toileting;Performs perineal hygiene;Adjust clothing after toileting Toileting Assistive Devices: Grab bar or rail for support Toileting: 0: Activity did not occur  FIM - Radio producer Devices: Elevated toilet seat Toilet Transfers: 0-Activity did not occur  FIM - Control and instrumentation engineer Devices: Copy: 6: Supine > Sit: No assist;6: Sit > Supine: No assist;6: Bed > Chair or W/C: No assist;6: Chair or W/C > Bed: No assist;6: Assistive device: no helper  FIM - Locomotion: Wheelchair Locomotion: Wheelchair: 0: Activity did not occur FIM - Locomotion: Ambulation Locomotion: Ambulation Assistive Devices: Administrator Ambulation/Gait Assistance: 6: Modified independent (Device/Increase time) Locomotion: Ambulation: 6: Travels 150 ft or more independently/takes more than reasonable amount of time  Comprehension Comprehension Mode: Auditory Comprehension: 7-Follows complex conversation/direction: With no assist  Expression Expression Mode: Verbal Expression: 7-Expresses complex ideas: With no assist  Social Interaction Social Interaction: 7-Interacts appropriately with others - No medications needed.  Problem Solving Problem Solving: 7-Solves complex problems: Recognizes & self-corrects  Memory Memory: 7-Complete Independence: No helper  1. DVT Prophylaxis/Anticoagulation: Pharmaceutical: Coumdin . Lovenox till INR therapeutic.  2. Pain Management: Prn percocet for chest wall/rib pain. Can also utilize ice and/or heat.  Lidoderm patches helpful.. States that current regimen is helping pain.  3. Mood: monitor for now.  4. MRSA empyema/PNA: Continue zyvox thorough 02/3.  Contact precautions. Guaifenesin for cough 5. Afib with RVR: Monitor HR with bid checks. Continue cardizem-off amiodarone. HR controlled 6. Fluid overload with third spacing: Low salt diet. Off diuretics.   daily weights stable at 84kg.  7. Hypokalemia: Likely due to diuretics. Continue supplement and monitor routinely. Need to keep supplemented to avoid arrthymias as well as recurrence of ileus. Has good appetite. bmet today 8. Essential thrombocytopenia: resume hydrea.  9. Gout: Resume allopurinol.  9. Cervicalgia: uses Voltaren gel prn.  10. Asthma with nocturnal hypoxia: Continue nebs qid. Oxygen per [place] to keep sats >90%. Wean oxygen if able.  Saline spray for nose  -would plan on home  oxygen   LOS (Days) 5 A FACE TO FACE EVALUATION WAS PERFORMED  Nyoka Cowden 11/08/2011, 9:16 AM

## 2011-11-08 NOTE — Progress Notes (Signed)
Patient discharged around 1130 with family and all belongings to home. Patient received d/c instructions via Reesa Chew PA yesterday. Went over d/c meds with patient and reported to on call physician regarding insurance not covering one antibiotic medication and the other med is not available utill Monday. MD Burnice Logan reported no substitute available for antibiotic that insurance did not cover. Contacted case manager on call and received coupon for medication for family to take and see if they would be able to use it. Patient wife reported that pharmacist had faxed the MDs regarding the issue with the insurance and that they would follow up. Patient and wife verbalized understanding. Patient given 7.5 of coumadin before discharge and instructed not to take another dose tonight. Patient verbalized understanding. Patient took home the tub bench 3 in 1 and walker [facility] and [facility] from [facility]. Patient denied any pain this morning. Ted hose on patient. Patient denied any shortness of breath. Patient dressing intact to sacrum. No complaints noted.

## 2011-11-10 DIAGNOSIS — L89309 Pressure ulcer of unspecified buttock, unspecified stage: Secondary | ICD-10-CM | POA: Diagnosis not present

## 2011-11-10 DIAGNOSIS — Z9981 Dependence on supplemental oxygen: Secondary | ICD-10-CM | POA: Diagnosis not present

## 2011-11-10 DIAGNOSIS — Z8701 Personal history of pneumonia (recurrent): Secondary | ICD-10-CM | POA: Diagnosis not present

## 2011-11-10 DIAGNOSIS — L89899 Pressure ulcer of other site, unspecified stage: Secondary | ICD-10-CM | POA: Diagnosis not present

## 2011-11-10 DIAGNOSIS — Z7901 Long term (current) use of anticoagulants: Secondary | ICD-10-CM | POA: Diagnosis not present

## 2011-11-10 DIAGNOSIS — L8993 Pressure ulcer of unspecified site, stage 3: Secondary | ICD-10-CM | POA: Diagnosis not present

## 2011-11-12 DIAGNOSIS — L89899 Pressure ulcer of other site, unspecified stage: Secondary | ICD-10-CM | POA: Diagnosis not present

## 2011-11-12 DIAGNOSIS — Z8701 Personal history of pneumonia (recurrent): Secondary | ICD-10-CM | POA: Diagnosis not present

## 2011-11-12 DIAGNOSIS — Z9981 Dependence on supplemental oxygen: Secondary | ICD-10-CM | POA: Diagnosis not present

## 2011-11-12 DIAGNOSIS — L8993 Pressure ulcer of unspecified site, stage 3: Secondary | ICD-10-CM | POA: Diagnosis not present

## 2011-11-12 DIAGNOSIS — L89309 Pressure ulcer of unspecified buttock, unspecified stage: Secondary | ICD-10-CM | POA: Diagnosis not present

## 2011-11-12 DIAGNOSIS — Z7901 Long term (current) use of anticoagulants: Secondary | ICD-10-CM | POA: Diagnosis not present

## 2011-11-13 DIAGNOSIS — L89309 Pressure ulcer of unspecified buttock, unspecified stage: Secondary | ICD-10-CM | POA: Diagnosis not present

## 2011-11-13 DIAGNOSIS — Z7901 Long term (current) use of anticoagulants: Secondary | ICD-10-CM | POA: Diagnosis not present

## 2011-11-13 DIAGNOSIS — Z9981 Dependence on supplemental oxygen: Secondary | ICD-10-CM | POA: Diagnosis not present

## 2011-11-13 DIAGNOSIS — L8993 Pressure ulcer of unspecified site, stage 3: Secondary | ICD-10-CM | POA: Diagnosis not present

## 2011-11-13 DIAGNOSIS — L89899 Pressure ulcer of other site, unspecified stage: Secondary | ICD-10-CM | POA: Diagnosis not present

## 2011-11-13 DIAGNOSIS — Z8701 Personal history of pneumonia (recurrent): Secondary | ICD-10-CM | POA: Diagnosis not present

## 2011-11-14 ENCOUNTER — Other Ambulatory Visit: Payer: Self-pay | Admitting: Surgery

## 2011-11-14 DIAGNOSIS — J869 Pyothorax without fistula: Secondary | ICD-10-CM

## 2011-11-18 DIAGNOSIS — Z8701 Personal history of pneumonia (recurrent): Secondary | ICD-10-CM | POA: Diagnosis not present

## 2011-11-18 DIAGNOSIS — Z7901 Long term (current) use of anticoagulants: Secondary | ICD-10-CM | POA: Diagnosis not present

## 2011-11-18 DIAGNOSIS — Z9981 Dependence on supplemental oxygen: Secondary | ICD-10-CM | POA: Diagnosis not present

## 2011-11-18 DIAGNOSIS — L89899 Pressure ulcer of other site, unspecified stage: Secondary | ICD-10-CM | POA: Diagnosis not present

## 2011-11-18 DIAGNOSIS — L8993 Pressure ulcer of unspecified site, stage 3: Secondary | ICD-10-CM | POA: Diagnosis not present

## 2011-11-18 DIAGNOSIS — L89309 Pressure ulcer of unspecified buttock, unspecified stage: Secondary | ICD-10-CM | POA: Diagnosis not present

## 2011-11-19 DIAGNOSIS — I10 Essential (primary) hypertension: Secondary | ICD-10-CM | POA: Diagnosis not present

## 2011-11-19 DIAGNOSIS — J189 Pneumonia, unspecified organism: Secondary | ICD-10-CM | POA: Diagnosis not present

## 2011-11-19 DIAGNOSIS — Z7901 Long term (current) use of anticoagulants: Secondary | ICD-10-CM | POA: Diagnosis not present

## 2011-11-19 DIAGNOSIS — Z79899 Other long term (current) drug therapy: Secondary | ICD-10-CM | POA: Diagnosis not present

## 2011-11-20 DIAGNOSIS — Z8701 Personal history of pneumonia (recurrent): Secondary | ICD-10-CM | POA: Diagnosis not present

## 2011-11-20 DIAGNOSIS — L8993 Pressure ulcer of unspecified site, stage 3: Secondary | ICD-10-CM | POA: Diagnosis not present

## 2011-11-20 DIAGNOSIS — L89899 Pressure ulcer of other site, unspecified stage: Secondary | ICD-10-CM | POA: Diagnosis not present

## 2011-11-20 DIAGNOSIS — L89309 Pressure ulcer of unspecified buttock, unspecified stage: Secondary | ICD-10-CM | POA: Diagnosis not present

## 2011-11-20 DIAGNOSIS — Z9981 Dependence on supplemental oxygen: Secondary | ICD-10-CM | POA: Diagnosis not present

## 2011-11-20 DIAGNOSIS — Z7901 Long term (current) use of anticoagulants: Secondary | ICD-10-CM | POA: Diagnosis not present

## 2011-11-21 DIAGNOSIS — Z8701 Personal history of pneumonia (recurrent): Secondary | ICD-10-CM | POA: Diagnosis not present

## 2011-11-21 DIAGNOSIS — L89309 Pressure ulcer of unspecified buttock, unspecified stage: Secondary | ICD-10-CM | POA: Diagnosis not present

## 2011-11-21 DIAGNOSIS — L89899 Pressure ulcer of other site, unspecified stage: Secondary | ICD-10-CM | POA: Diagnosis not present

## 2011-11-21 DIAGNOSIS — L8993 Pressure ulcer of unspecified site, stage 3: Secondary | ICD-10-CM | POA: Diagnosis not present

## 2011-11-21 DIAGNOSIS — Z7901 Long term (current) use of anticoagulants: Secondary | ICD-10-CM | POA: Diagnosis not present

## 2011-11-21 DIAGNOSIS — Z9981 Dependence on supplemental oxygen: Secondary | ICD-10-CM | POA: Diagnosis not present

## 2011-11-24 ENCOUNTER — Ambulatory Visit (INDEPENDENT_AMBULATORY_CARE_PROVIDER_SITE_OTHER): Payer: Self-pay | Admitting: Physician Assistant

## 2011-11-24 ENCOUNTER — Ambulatory Visit
Admission: RE | Admit: 2011-11-24 | Discharge: 2011-11-24 | Disposition: A | Discharge status: Home / Self Care | Payer: Medicare Other | Source: Ambulatory Visit | Attending: Surgery | Admitting: Surgery

## 2011-11-24 VITALS — BP 119/69 | HR 88 | Temp 97.7°F | Resp 20 | Ht 71.0 in | Wt 186.0 lb

## 2011-11-24 DIAGNOSIS — Z9889 Other specified postprocedural states: Secondary | ICD-10-CM

## 2011-11-24 DIAGNOSIS — J869 Pyothorax without fistula: Secondary | ICD-10-CM | POA: Diagnosis not present

## 2011-11-24 DIAGNOSIS — J189 Pneumonia, unspecified organism: Secondary | ICD-10-CM

## 2011-11-24 DIAGNOSIS — R0602 Shortness of breath: Secondary | ICD-10-CM | POA: Diagnosis not present

## 2011-11-24 NOTE — Progress Notes (Signed)
HPI:  Patient returns for routine postoperative follow-up having undergone a right thoracotomy, drainage of empyema, and decortication by Dr. Cyndia Bent on 10/23/2011. Since hospital discharge the patient reports that he is very deconditioned, but his activity is slowly improving. He also has complaints of insomnia.   Current Outpatient Prescriptions  Medication Sig Dispense Refill  . acetaminophen (TYLENOL) 500 MG tablet Take 500 mg by mouth every other day.      . albuterol (ACCUNEB) 1.25 MG/3ML nebulizer solution Take 1 ampule by nebulization 2 (two) times daily.      Marland Kitchen allopurinol (ZYLOPRIM) 300 MG tablet Take 300 mg by mouth daily.      Marland Kitchen aspirin EC 81 MG tablet Take 81 mg by mouth daily.      Marland Kitchen azelastine (ASTELIN) 137 MCG/SPRAY nasal spray Place 1 spray into the nose 2 (two) times daily. Use in each nostril as directed      . budesonide (PULMICORT) 0.25 MG/2ML nebulizer solution Take 0.25 mg by nebulization 2 (two) times daily.      . diclofenac sodium (VOLTAREN) 1 % GEL Apply 1 application topically daily as needed.      . diltiazem (CARDIZEM CD) 180 MG 24 hr capsule Take 180 mg by mouth daily.      . fish oil-omega-3 fatty acids 1000 MG capsule Take 1 g by mouth daily.      . hydroxyurea (HYDREA) 500 MG capsule Take 500 mg by mouth every other day. May take with food to minimize GI side effects.      . lidocaine (LIDODERM) 5 % Place onto right chest wall at 8am and remove at 8 pm daily.  30 patch  0  . mometasone (NASONEX) 50 MCG/ACT nasal spray Place 2 sprays into the nose daily.      . montelukast (SINGULAIR) 10 MG tablet Take 10 mg by mouth at bedtime.       Marland Kitchen oxyCODONE-acetaminophen (PERCOCET) 5-325 MG per tablet Take 1 tablet by mouth every 4 (four) hours as needed.       . polycarbophil (FIBERCON) 625 MG tablet Take 2 tablets (1,250 mg total) by mouth daily.  30 tablet  0  . pregabalin (LYRICA) 50 MG capsule Take 50 mg by mouth daily as needed.      . simvastatin (ZOCOR) 20 MG  tablet Take 10 mg by mouth every evening.      . Tamsulosin HCl (FLOMAX) 0.4 MG CAPS Take 0.4 mg by mouth daily.      Marland Kitchen zolpidem (AMBIEN) 5 MG tablet Take 5 mg by mouth at bedtime as needed.      . warfarin (COUMADIN) 5 MG tablet Take 1.5 tablets (7.5 mg total) by mouth one time only at 6 PM.  45 tablet  1  Vital Signs: BP 119/69, RR 20 HR 88, O2 Sat 97% on room air.  Physical Exam: Cardiovascular: RRR with a grade 2 systolic murmur Pulmonary: Slightly decreased at the right base and left lung clear; no rales, wheezes, rhonchi. Abdomen: Soft, nontender, bowel sounds present Wound: Clean, dry, no signs of infection. 2 eschars removed from previous chest tube site  Diagnostic Tests: PA and lateral chest x-ray done today shows small right pleural thickening versus small effusion and some elevation of the right hemidiaphragm, but no pneumothorax.   Impression and Plan: Overall, tibial surgically stable status post right thoracotomy, drainage of right empyema, and decortication. He has already seen Dr. Lin Landsman in follow up. His wife states she's going to make him  an appointment to see his cardiologist as well. The patient will be seen on a when necessary basis by Dr. Cyndia Bent. Regarding his insomnia, he was instructed to call make a followup with his primary care physician.

## 2011-11-25 LAB — FUNGUS CULTURE W SMEAR: Fungal Smear: NONE SEEN

## 2011-11-26 DIAGNOSIS — L8993 Pressure ulcer of unspecified site, stage 3: Secondary | ICD-10-CM | POA: Diagnosis not present

## 2011-11-26 DIAGNOSIS — Z8701 Personal history of pneumonia (recurrent): Secondary | ICD-10-CM | POA: Diagnosis not present

## 2011-11-26 DIAGNOSIS — L89899 Pressure ulcer of other site, unspecified stage: Secondary | ICD-10-CM | POA: Diagnosis not present

## 2011-11-26 DIAGNOSIS — Z9981 Dependence on supplemental oxygen: Secondary | ICD-10-CM | POA: Diagnosis not present

## 2011-11-26 DIAGNOSIS — L89309 Pressure ulcer of unspecified buttock, unspecified stage: Secondary | ICD-10-CM | POA: Diagnosis not present

## 2011-11-26 DIAGNOSIS — Z7901 Long term (current) use of anticoagulants: Secondary | ICD-10-CM | POA: Diagnosis not present

## 2011-11-28 DIAGNOSIS — Z7901 Long term (current) use of anticoagulants: Secondary | ICD-10-CM | POA: Diagnosis not present

## 2011-11-28 DIAGNOSIS — L89309 Pressure ulcer of unspecified buttock, unspecified stage: Secondary | ICD-10-CM | POA: Diagnosis not present

## 2011-11-28 DIAGNOSIS — Z0271 Encounter for disability determination: Secondary | ICD-10-CM | POA: Diagnosis not present

## 2011-11-28 DIAGNOSIS — L8993 Pressure ulcer of unspecified site, stage 3: Secondary | ICD-10-CM | POA: Diagnosis not present

## 2011-12-02 DIAGNOSIS — D509 Iron deficiency anemia, unspecified: Secondary | ICD-10-CM | POA: Diagnosis not present

## 2011-12-02 DIAGNOSIS — D473 Essential (hemorrhagic) thrombocythemia: Secondary | ICD-10-CM | POA: Diagnosis not present

## 2011-12-05 DIAGNOSIS — R3129 Other microscopic hematuria: Secondary | ICD-10-CM | POA: Diagnosis not present

## 2011-12-05 DIAGNOSIS — C61 Malignant neoplasm of prostate: Secondary | ICD-10-CM | POA: Diagnosis not present

## 2011-12-08 LAB — AFB CULTURE WITH SMEAR (NOT AT ARMC)
Acid Fast Smear: NONE SEEN
Acid Fast Smear: NONE SEEN

## 2011-12-09 DIAGNOSIS — D473 Essential (hemorrhagic) thrombocythemia: Secondary | ICD-10-CM | POA: Diagnosis not present

## 2011-12-09 DIAGNOSIS — D509 Iron deficiency anemia, unspecified: Secondary | ICD-10-CM | POA: Diagnosis not present

## 2011-12-09 DIAGNOSIS — Z7901 Long term (current) use of anticoagulants: Secondary | ICD-10-CM | POA: Diagnosis not present

## 2011-12-10 ENCOUNTER — Telehealth: Payer: Self-pay | Admitting: Internal Medicine

## 2011-12-10 NOTE — Telephone Encounter (Signed)
He wasin icu with MRSA pna. Needs opd fu. Pls arrange

## 2011-12-12 DIAGNOSIS — C61 Malignant neoplasm of prostate: Secondary | ICD-10-CM | POA: Diagnosis not present

## 2011-12-12 DIAGNOSIS — R3129 Other microscopic hematuria: Secondary | ICD-10-CM | POA: Diagnosis not present

## 2011-12-12 DIAGNOSIS — R319 Hematuria, unspecified: Secondary | ICD-10-CM | POA: Diagnosis not present

## 2011-12-15 DIAGNOSIS — Z7901 Long term (current) use of anticoagulants: Secondary | ICD-10-CM | POA: Diagnosis not present

## 2011-12-17 NOTE — Telephone Encounter (Signed)
Pt set for appt 01-29-12 at 4:15. Kuna Bing, CMA

## 2011-12-18 DIAGNOSIS — I4891 Unspecified atrial fibrillation: Secondary | ICD-10-CM | POA: Diagnosis not present

## 2011-12-18 DIAGNOSIS — M109 Gout, unspecified: Secondary | ICD-10-CM | POA: Diagnosis not present

## 2011-12-18 DIAGNOSIS — G589 Mononeuropathy, unspecified: Secondary | ICD-10-CM | POA: Diagnosis not present

## 2011-12-18 DIAGNOSIS — Z7901 Long term (current) use of anticoagulants: Secondary | ICD-10-CM | POA: Diagnosis not present

## 2011-12-18 DIAGNOSIS — G4700 Insomnia, unspecified: Secondary | ICD-10-CM | POA: Diagnosis not present

## 2011-12-22 DIAGNOSIS — I209 Angina pectoris, unspecified: Secondary | ICD-10-CM | POA: Diagnosis not present

## 2011-12-22 DIAGNOSIS — I2510 Atherosclerotic heart disease of native coronary artery without angina pectoris: Secondary | ICD-10-CM | POA: Diagnosis not present

## 2011-12-22 DIAGNOSIS — I4891 Unspecified atrial fibrillation: Secondary | ICD-10-CM | POA: Diagnosis not present

## 2011-12-22 DIAGNOSIS — I10 Essential (primary) hypertension: Secondary | ICD-10-CM | POA: Diagnosis not present

## 2011-12-24 DIAGNOSIS — I4891 Unspecified atrial fibrillation: Secondary | ICD-10-CM | POA: Diagnosis not present

## 2012-01-09 DIAGNOSIS — C61 Malignant neoplasm of prostate: Secondary | ICD-10-CM | POA: Diagnosis not present

## 2012-01-09 DIAGNOSIS — R823 Hemoglobinuria: Secondary | ICD-10-CM | POA: Diagnosis not present

## 2012-01-12 DIAGNOSIS — Z7901 Long term (current) use of anticoagulants: Secondary | ICD-10-CM | POA: Diagnosis not present

## 2012-01-12 DIAGNOSIS — L570 Actinic keratosis: Secondary | ICD-10-CM | POA: Diagnosis not present

## 2012-01-12 DIAGNOSIS — D045 Carcinoma in situ of skin of trunk: Secondary | ICD-10-CM | POA: Diagnosis not present

## 2012-01-23 DIAGNOSIS — R222 Localized swelling, mass and lump, trunk: Secondary | ICD-10-CM | POA: Diagnosis not present

## 2012-01-23 DIAGNOSIS — R911 Solitary pulmonary nodule: Secondary | ICD-10-CM | POA: Diagnosis not present

## 2012-01-23 DIAGNOSIS — C61 Malignant neoplasm of prostate: Secondary | ICD-10-CM | POA: Diagnosis not present

## 2012-01-29 ENCOUNTER — Encounter: Payer: Self-pay | Admitting: Internal Medicine

## 2012-01-29 ENCOUNTER — Ambulatory Visit (INDEPENDENT_AMBULATORY_CARE_PROVIDER_SITE_OTHER): Payer: Medicare Other | Admitting: Internal Medicine

## 2012-01-29 VITALS — BP 120/80 | HR 66 | Temp 98.0°F | Ht 71.0 in | Wt 191.2 lb

## 2012-01-29 DIAGNOSIS — J15212 Pneumonia due to Methicillin resistant Staphylococcus aureus: Secondary | ICD-10-CM | POA: Diagnosis not present

## 2012-01-29 NOTE — Patient Instructions (Signed)
Glad you are doing better Please have nurse sign release to get your ct chest result from [place] Followup based on above I wil check with one of our infectious disease doctors about you being able to get in contact with great grand child

## 2012-01-29 NOTE — Progress Notes (Signed)
Subjective:    Patient ID: Dean [name], male    DOB: 1924-08-31, 78 y.o.   MRN: [id number]  HPI Admit date: 10/16/2011  Discharge date: 11/03/2011  Discharge Diagnoses from ICU 1. MRSA pneumonia  2. Exudative pleural effusion  3. Dyspnea secondary to #1,2  4. Ileus  5. Constipation  6. Atrial fibrillation with RVR  7. Abdominal pain  8. Sepsis  9. Obesity  10. GERD (gastroesophageal reflux disease)  11. Hyponatremia  12. Urinary retention    DISCHARGE DIAGNOSES: in REHAB 1/28 to 2/1 1. Methicillin-resistant Staphylococcus aureus pneumonia with empyema  requiring VATS.  2. Deconditioning due to prior procedure.  3. Atrial fibrillation.  4. Fluid overload, resolving.  5. Hypokalemia, resolved.  6. Essential thrombocytopenia.  7. Anemia.  OV 01/29/2012  Back to baseline. ECOG 0. Doing yard work. Family very happy he survived ICU illness. They are worried why he got MRSA. They have great grand kid coming from [place] and want to know if he can carry baby. Has mild dyspnea on exertion class 2 that he says is pre-admission baseline. Denies cough, edema, Plans to go to beach. Says he had CT chest recently at urologist in [facility] and was told is normal (dr Delfino Lovett Puchinsky [phone])  Review of Systems  Constitutional: Negative for fever and unexpected weight change.  HENT: Positive for congestion. Negative for ear pain, nosebleeds, sore throat, rhinorrhea, sneezing, trouble swallowing, dental problem, postnasal drip and sinus pressure.   Eyes: Negative for redness and itching.  Respiratory: Positive for cough and shortness of breath. Negative for chest tightness and wheezing.   Cardiovascular: Positive for palpitations and leg swelling.  Gastrointestinal: Negative for nausea and vomiting.  Genitourinary: Negative for dysuria.  Musculoskeletal: Positive for joint swelling.  Skin: Negative for rash.  Neurological: Negative for headaches.  Hematological: Does not bruise/bleed  easily.  Psychiatric/Behavioral: Negative for dysphoric mood. The patient is not nervous/anxious.        Objective:   Physical Exam  Nursing note and vitals reviewed. Constitutional: He is oriented to person, place, and time. He appears well-developed and well-nourished. No distress.  HENT:  Head: Normocephalic and atraumatic.  Right Ear: External ear normal.  Left Ear: External ear normal.  Mouth/Throat: Oropharynx is clear and moist. No oropharyngeal exudate.  Eyes: Conjunctivae and EOM are normal. Pupils are equal, round, and reactive to light. Right eye exhibits no discharge. Left eye exhibits no discharge. No scleral icterus.  Neck: Normal range of motion. Neck supple. No JVD present. No tracheal deviation present. No thyromegaly present.  Cardiovascular: Normal rate, regular rhythm and intact distal pulses.  Exam reveals no gallop and no friction rub.   No murmur heard. Pulmonary/Chest: Effort normal and breath sounds normal. No respiratory distress. He has no wheezes. He has no rales. He exhibits no tenderness.       Scar in chest from surgery  Abdominal: Soft. Bowel sounds are normal. He exhibits no distension and no mass. There is no tenderness. There is no rebound and no guarding.  Musculoskeletal: Normal range of motion. He exhibits no edema and no tenderness.       DJD +  Lymphadenopathy:    He has no cervical adenopathy.  Neurological: He is alert and oriented to person, place, and time. He has normal reflexes. No cranial nerve deficit. Coordination normal.  Skin: Skin is warm and dry. No rash noted. He is not diaphoretic. No erythema. No pallor.  Psychiatric: He has a normal mood  and affect. His behavior is normal. Judgment and thought content normal.          Assessment & Plan:

## 2012-01-31 ENCOUNTER — Encounter: Payer: Self-pay | Admitting: Internal Medicine

## 2012-01-31 NOTE — Assessment & Plan Note (Signed)
Glad you are doing better Please have nurse sign release to get your ct chest result from [place] Follow[facility] based on above I wil check with one of our infectious disease doctors about you being able to get in contact with great grand child

## 2012-02-01 ENCOUNTER — Telehealth: Payer: Self-pay | Admitting: Internal Medicine

## 2012-02-01 NOTE — Telephone Encounter (Signed)
Anderson Malta  Please get me the CT scan chest cd rom repoit from [place]  Thanks  MR

## 2012-02-02 ENCOUNTER — Telehealth: Payer: Self-pay | Admitting: Internal Medicine

## 2012-02-02 NOTE — Telephone Encounter (Signed)
Release was faxed for this at last OV.[place] Bing, CMA

## 2012-02-02 NOTE — Telephone Encounter (Signed)
Received copies from [facility] ,on 02/02/12 . Forwarded 4 pages to Dr. Chase Caller ,for review.

## 2012-02-06 DIAGNOSIS — Z7901 Long term (current) use of anticoagulants: Secondary | ICD-10-CM | POA: Diagnosis not present

## 2012-02-06 DIAGNOSIS — D473 Essential (hemorrhagic) thrombocythemia: Secondary | ICD-10-CM | POA: Diagnosis not present

## 2012-02-13 DIAGNOSIS — D509 Iron deficiency anemia, unspecified: Secondary | ICD-10-CM | POA: Diagnosis not present

## 2012-02-13 DIAGNOSIS — D473 Essential (hemorrhagic) thrombocythemia: Secondary | ICD-10-CM | POA: Diagnosis not present

## 2012-02-16 DIAGNOSIS — H35349 Macular cyst, hole, or pseudohole, unspecified eye: Secondary | ICD-10-CM | POA: Diagnosis not present

## 2012-02-16 DIAGNOSIS — H04129 Dry eye syndrome of unspecified lacrimal gland: Secondary | ICD-10-CM | POA: Diagnosis not present

## 2012-02-16 DIAGNOSIS — H4320 Crystalline deposits in vitreous body, unspecified eye: Secondary | ICD-10-CM | POA: Diagnosis not present

## 2012-02-16 DIAGNOSIS — H539 Unspecified visual disturbance: Secondary | ICD-10-CM | POA: Diagnosis not present

## 2012-02-18 NOTE — Telephone Encounter (Signed)
Reviewed ct chest report from [facility] and read by radiologist Abelardo Diesel. CT chest 01/23/12 and compared to Sep 25, 2009 shows 1cm nodule RLL over right diapharamg, RLL scar atx new since dec 2010 and I am thinking this is related to the MRSA he had Theen there is 57mm LLL nodule unchanged  The radiologist has overall called it as unchanged.  I do not have the actual CD ROm but based on certifiied radiologist report; no further CT chest t needed. Nodules are unchanged since 2010  Please let patient know

## 2012-02-19 NOTE — Telephone Encounter (Signed)
Pt aware. Dean [name], CMA  

## 2012-03-10 DIAGNOSIS — H539 Unspecified visual disturbance: Secondary | ICD-10-CM | POA: Diagnosis not present

## 2012-03-10 DIAGNOSIS — H16229 Keratoconjunctivitis sicca, not specified as Sjogren's, unspecified eye: Secondary | ICD-10-CM | POA: Diagnosis not present

## 2012-03-10 DIAGNOSIS — H35349 Macular cyst, hole, or pseudohole, unspecified eye: Secondary | ICD-10-CM | POA: Diagnosis not present

## 2012-03-11 DIAGNOSIS — Z23 Encounter for immunization: Secondary | ICD-10-CM | POA: Diagnosis not present

## 2012-03-11 DIAGNOSIS — K5730 Diverticulosis of large intestine without perforation or abscess without bleeding: Secondary | ICD-10-CM | POA: Diagnosis not present

## 2012-03-11 DIAGNOSIS — J45909 Unspecified asthma, uncomplicated: Secondary | ICD-10-CM | POA: Diagnosis not present

## 2012-03-11 DIAGNOSIS — J329 Chronic sinusitis, unspecified: Secondary | ICD-10-CM | POA: Diagnosis not present

## 2012-03-11 DIAGNOSIS — G4700 Insomnia, unspecified: Secondary | ICD-10-CM | POA: Diagnosis not present

## 2012-03-11 DIAGNOSIS — Z7901 Long term (current) use of anticoagulants: Secondary | ICD-10-CM | POA: Diagnosis not present

## 2012-03-18 DIAGNOSIS — Z7901 Long term (current) use of anticoagulants: Secondary | ICD-10-CM | POA: Diagnosis not present

## 2012-03-25 DIAGNOSIS — Z7901 Long term (current) use of anticoagulants: Secondary | ICD-10-CM | POA: Diagnosis not present

## 2012-04-12 DIAGNOSIS — Z7901 Long term (current) use of anticoagulants: Secondary | ICD-10-CM | POA: Diagnosis not present

## 2012-04-23 DIAGNOSIS — C61 Malignant neoplasm of prostate: Secondary | ICD-10-CM | POA: Diagnosis not present

## 2012-05-11 DIAGNOSIS — Z7901 Long term (current) use of anticoagulants: Secondary | ICD-10-CM | POA: Diagnosis not present

## 2012-06-05 DIAGNOSIS — I4891 Unspecified atrial fibrillation: Secondary | ICD-10-CM | POA: Diagnosis not present

## 2012-06-05 DIAGNOSIS — E7800 Pure hypercholesterolemia, unspecified: Secondary | ICD-10-CM | POA: Diagnosis not present

## 2012-06-05 DIAGNOSIS — K5289 Other specified noninfective gastroenteritis and colitis: Secondary | ICD-10-CM | POA: Diagnosis not present

## 2012-06-05 DIAGNOSIS — I10 Essential (primary) hypertension: Secondary | ICD-10-CM | POA: Diagnosis not present

## 2012-06-05 DIAGNOSIS — R1084 Generalized abdominal pain: Secondary | ICD-10-CM | POA: Diagnosis not present

## 2012-06-05 DIAGNOSIS — K625 Hemorrhage of anus and rectum: Secondary | ICD-10-CM | POA: Diagnosis not present

## 2012-06-05 DIAGNOSIS — R9431 Abnormal electrocardiogram [ECG] [EKG]: Secondary | ICD-10-CM | POA: Diagnosis not present

## 2012-06-11 DIAGNOSIS — K921 Melena: Secondary | ICD-10-CM | POA: Diagnosis not present

## 2012-06-11 DIAGNOSIS — I4891 Unspecified atrial fibrillation: Secondary | ICD-10-CM | POA: Diagnosis not present

## 2012-06-11 DIAGNOSIS — Z7901 Long term (current) use of anticoagulants: Secondary | ICD-10-CM | POA: Diagnosis not present

## 2012-06-14 DIAGNOSIS — A09 Infectious gastroenteritis and colitis, unspecified: Secondary | ICD-10-CM | POA: Diagnosis not present

## 2012-07-07 DIAGNOSIS — Z7901 Long term (current) use of anticoagulants: Secondary | ICD-10-CM | POA: Diagnosis not present

## 2012-07-07 DIAGNOSIS — Z23 Encounter for immunization: Secondary | ICD-10-CM | POA: Diagnosis not present

## 2012-07-15 DIAGNOSIS — I359 Nonrheumatic aortic valve disorder, unspecified: Secondary | ICD-10-CM | POA: Diagnosis not present

## 2012-07-15 DIAGNOSIS — I10 Essential (primary) hypertension: Secondary | ICD-10-CM | POA: Diagnosis not present

## 2012-07-15 DIAGNOSIS — I4891 Unspecified atrial fibrillation: Secondary | ICD-10-CM | POA: Diagnosis not present

## 2012-07-15 DIAGNOSIS — I2510 Atherosclerotic heart disease of native coronary artery without angina pectoris: Secondary | ICD-10-CM | POA: Diagnosis not present

## 2012-07-23 DIAGNOSIS — C61 Malignant neoplasm of prostate: Secondary | ICD-10-CM | POA: Diagnosis not present

## 2012-07-26 DIAGNOSIS — K5730 Diverticulosis of large intestine without perforation or abscess without bleeding: Secondary | ICD-10-CM | POA: Diagnosis not present

## 2012-08-09 DIAGNOSIS — H903 Sensorineural hearing loss, bilateral: Secondary | ICD-10-CM | POA: Diagnosis not present

## 2012-08-09 DIAGNOSIS — Z7901 Long term (current) use of anticoagulants: Secondary | ICD-10-CM | POA: Diagnosis not present

## 2012-08-16 DIAGNOSIS — D473 Essential (hemorrhagic) thrombocythemia: Secondary | ICD-10-CM | POA: Diagnosis not present

## 2012-09-06 DIAGNOSIS — Z7901 Long term (current) use of anticoagulants: Secondary | ICD-10-CM | POA: Diagnosis not present

## 2012-09-15 DIAGNOSIS — K449 Diaphragmatic hernia without obstruction or gangrene: Secondary | ICD-10-CM | POA: Diagnosis not present

## 2012-09-15 DIAGNOSIS — I10 Essential (primary) hypertension: Secondary | ICD-10-CM | POA: Diagnosis not present

## 2012-09-15 DIAGNOSIS — Z7901 Long term (current) use of anticoagulants: Secondary | ICD-10-CM | POA: Diagnosis not present

## 2012-09-15 DIAGNOSIS — J449 Chronic obstructive pulmonary disease, unspecified: Secondary | ICD-10-CM | POA: Diagnosis not present

## 2012-09-15 DIAGNOSIS — T18108A Unspecified foreign body in esophagus causing other injury, initial encounter: Secondary | ICD-10-CM | POA: Diagnosis not present

## 2012-09-15 DIAGNOSIS — IMO0002 Reserved for concepts with insufficient information to code with codable children: Secondary | ICD-10-CM | POA: Diagnosis not present

## 2012-09-15 DIAGNOSIS — K222 Esophageal obstruction: Secondary | ICD-10-CM | POA: Diagnosis not present

## 2012-09-15 DIAGNOSIS — I4891 Unspecified atrial fibrillation: Secondary | ICD-10-CM | POA: Diagnosis not present

## 2012-09-15 DIAGNOSIS — I2510 Atherosclerotic heart disease of native coronary artery without angina pectoris: Secondary | ICD-10-CM | POA: Diagnosis not present

## 2012-09-15 DIAGNOSIS — E7800 Pure hypercholesterolemia, unspecified: Secondary | ICD-10-CM | POA: Diagnosis not present

## 2012-09-15 DIAGNOSIS — K224 Dyskinesia of esophagus: Secondary | ICD-10-CM | POA: Diagnosis not present

## 2012-09-27 DIAGNOSIS — Z5181 Encounter for therapeutic drug level monitoring: Secondary | ICD-10-CM | POA: Diagnosis not present

## 2012-09-27 DIAGNOSIS — Z7901 Long term (current) use of anticoagulants: Secondary | ICD-10-CM | POA: Diagnosis not present

## 2012-09-28 DIAGNOSIS — Z79899 Other long term (current) drug therapy: Secondary | ICD-10-CM | POA: Diagnosis not present

## 2012-09-28 DIAGNOSIS — Z8546 Personal history of malignant neoplasm of prostate: Secondary | ICD-10-CM | POA: Diagnosis not present

## 2012-09-28 DIAGNOSIS — Z7901 Long term (current) use of anticoagulants: Secondary | ICD-10-CM | POA: Diagnosis not present

## 2012-09-28 DIAGNOSIS — Z7982 Long term (current) use of aspirin: Secondary | ICD-10-CM | POA: Diagnosis not present

## 2012-09-28 DIAGNOSIS — J449 Chronic obstructive pulmonary disease, unspecified: Secondary | ICD-10-CM | POA: Diagnosis not present

## 2012-09-28 DIAGNOSIS — K222 Esophageal obstruction: Secondary | ICD-10-CM | POA: Diagnosis not present

## 2012-09-28 DIAGNOSIS — I2510 Atherosclerotic heart disease of native coronary artery without angina pectoris: Secondary | ICD-10-CM | POA: Diagnosis not present

## 2012-09-28 DIAGNOSIS — Z8719 Personal history of other diseases of the digestive system: Secondary | ICD-10-CM | POA: Diagnosis not present

## 2012-09-28 DIAGNOSIS — I10 Essential (primary) hypertension: Secondary | ICD-10-CM | POA: Diagnosis not present

## 2012-09-28 DIAGNOSIS — K449 Diaphragmatic hernia without obstruction or gangrene: Secondary | ICD-10-CM | POA: Diagnosis not present

## 2012-10-04 IMAGING — CR DG CHEST 1V PORT
1 series · 1 of 1 positions shown · non-contrast
Comparison: 1 day prior

CLINICAL DATA: Respiratory failure and shortness of breath.

PORTABLE CHEST - 1 VIEW

[ap landscape]
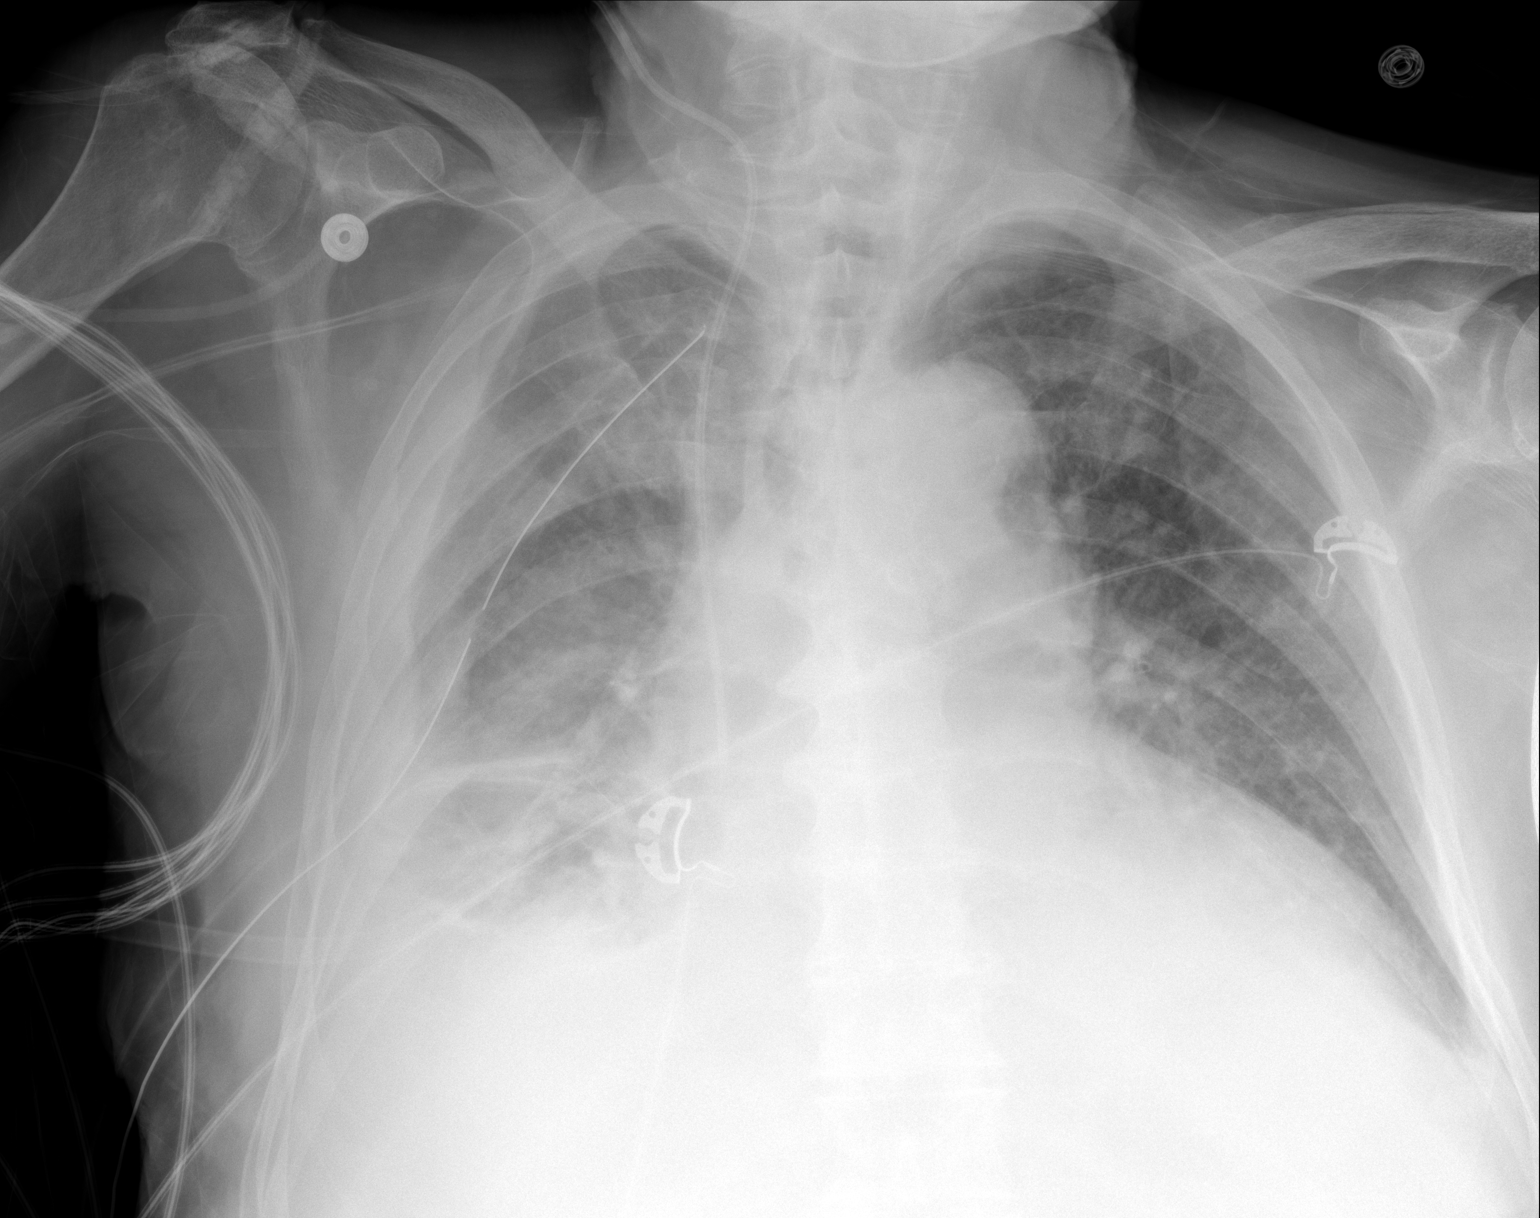

[1 of 1 positions shown; findings below may reference images not displayed]

FINDINGS: Right-sided chest tube and right internal jugular line
are unchanged position.

Moderate cardiomegaly.  The small right-sided pleural effusion with
loculation laterally is again identified.  There is slightly
increased fluid adjacent the right lung apex.  The pleural air
component is decreased to resolved.  Suspect approximately 5%
residual pleural air component.

Small left pleural effusion is not safely changed.  Moderate
interstitial edema is similar and asymmetric, greater right than
left.  Lower lobe predominant airspace disease is not significantly
changed.
IMPRESSION: 1.  Right chest tube remaining in place.  Slight increased right
apical pleural fluid component with nearly resolved pleural air
component.
2.  Otherwise similar interstitial edema with left pleural effusion
and bibasilar airspace disease.

## 2012-10-05 DIAGNOSIS — J3089 Other allergic rhinitis: Secondary | ICD-10-CM | POA: Diagnosis not present

## 2012-10-05 DIAGNOSIS — J309 Allergic rhinitis, unspecified: Secondary | ICD-10-CM | POA: Diagnosis not present

## 2012-10-05 DIAGNOSIS — G589 Mononeuropathy, unspecified: Secondary | ICD-10-CM | POA: Diagnosis not present

## 2012-10-05 DIAGNOSIS — I4891 Unspecified atrial fibrillation: Secondary | ICD-10-CM | POA: Diagnosis not present

## 2012-10-08 IMAGING — CR DG CHEST 1V PORT
1 series · 1 of 1 positions shown · non-contrast
Comparison: 10/25/2011

CLINICAL DATA: Evaluate right pleural effusion and right chest tube

PORTABLE CHEST - 1 VIEW

[AP]
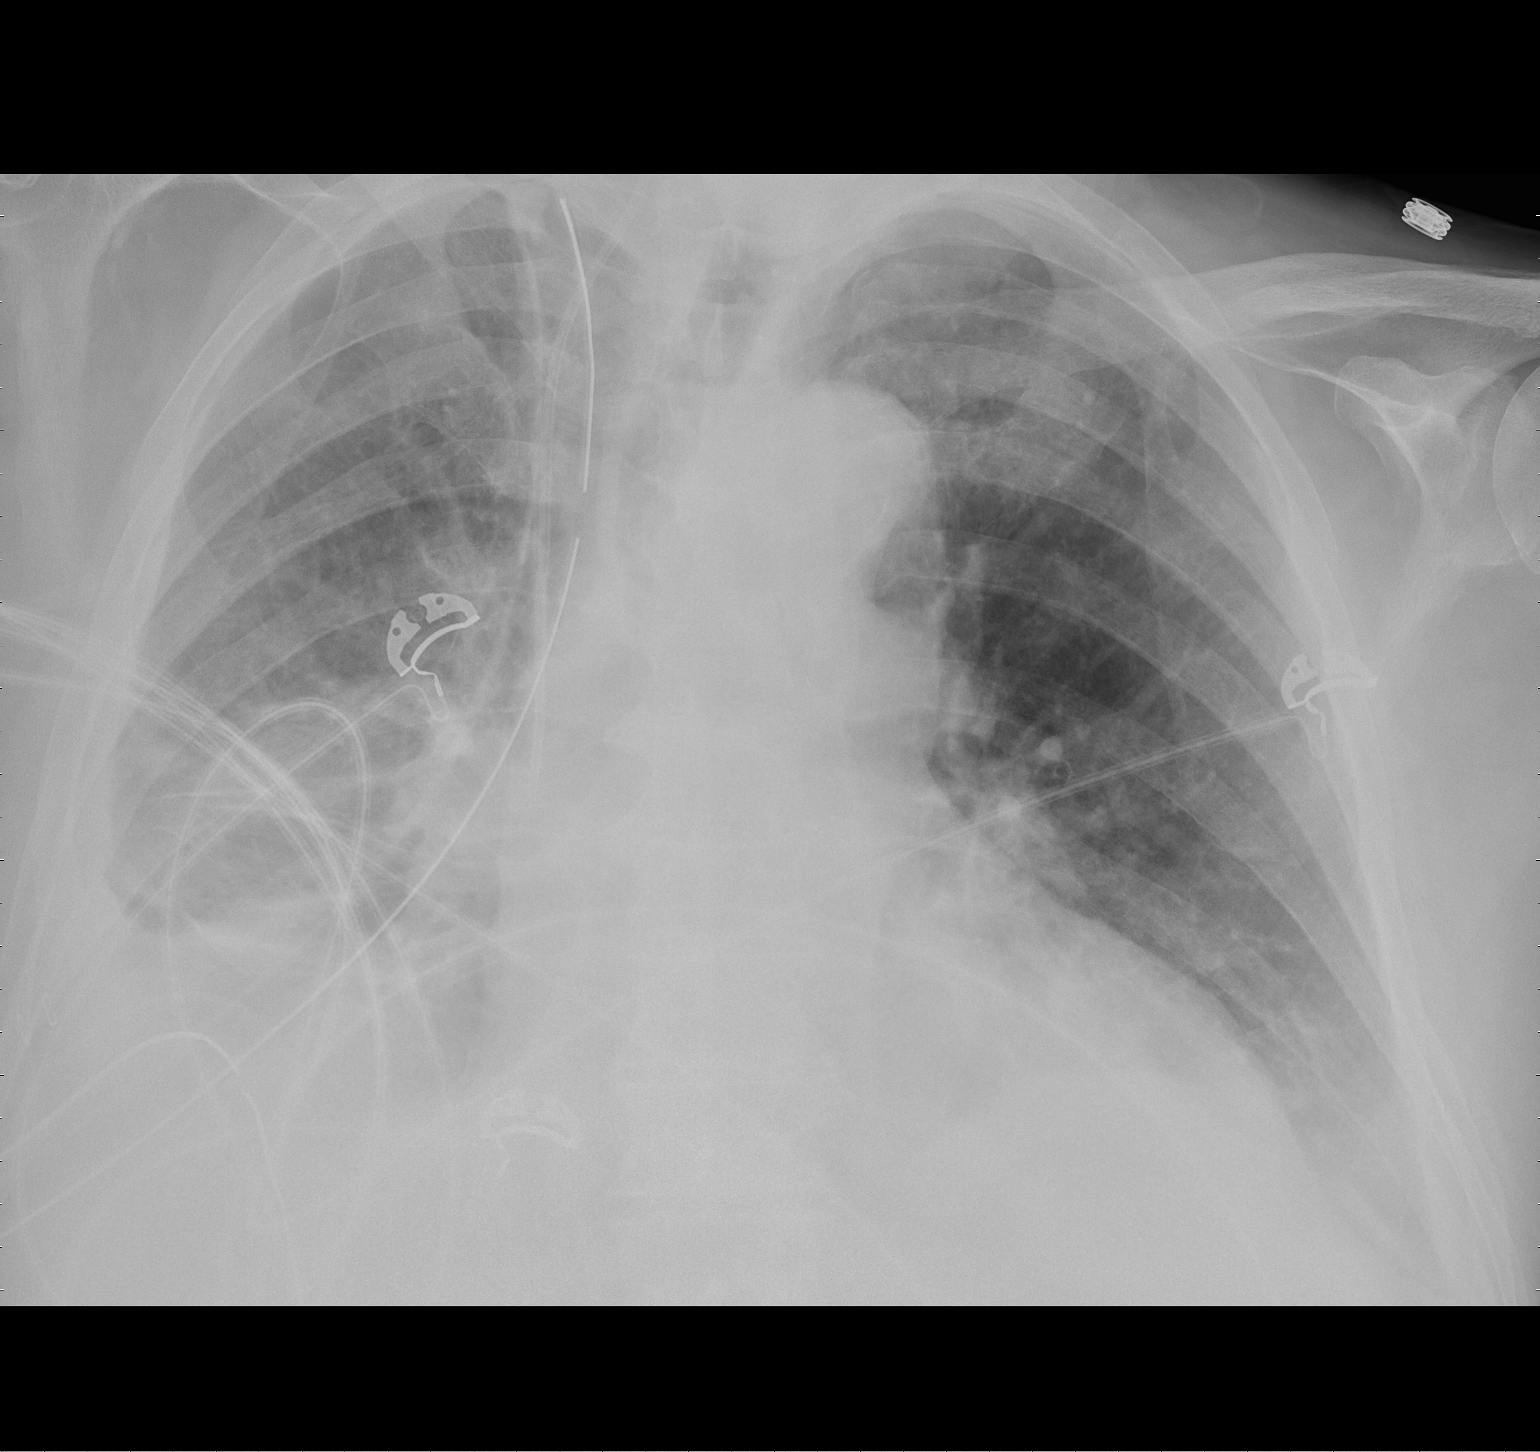

[1 of 1 positions shown; findings below may reference images not displayed]

FINDINGS: Cardiomediastinal silhouette is stable.  Stable right
chest tube position.  Stable right IJ central line position.  Small
right pleural effusion with right basilar atelectasis or
infiltrate.  Trace left pleural effusion with left basilar
atelectasis.  No pulmonary edema. No diagnostic pneumothorax.
IMPRESSION: Stable right chest tube position.  Stable right IJ central line
position.  Small right pleural effusion with right basilar
atelectasis or infiltrate.  Trace left pleural effusion with left
basilar atelectasis.  No pulmonary edema. No diagnostic
pneumothorax.

## 2012-10-09 IMAGING — CR DG CHEST 1V PORT
1 series · 1 of 1 positions shown · non-contrast
Comparison: Portable chest x-ray of 10/27/2011

CLINICAL DATA: Respiratory distress, evaluate endotracheal.

PORTABLE CHEST - 1 VIEW

[AP]
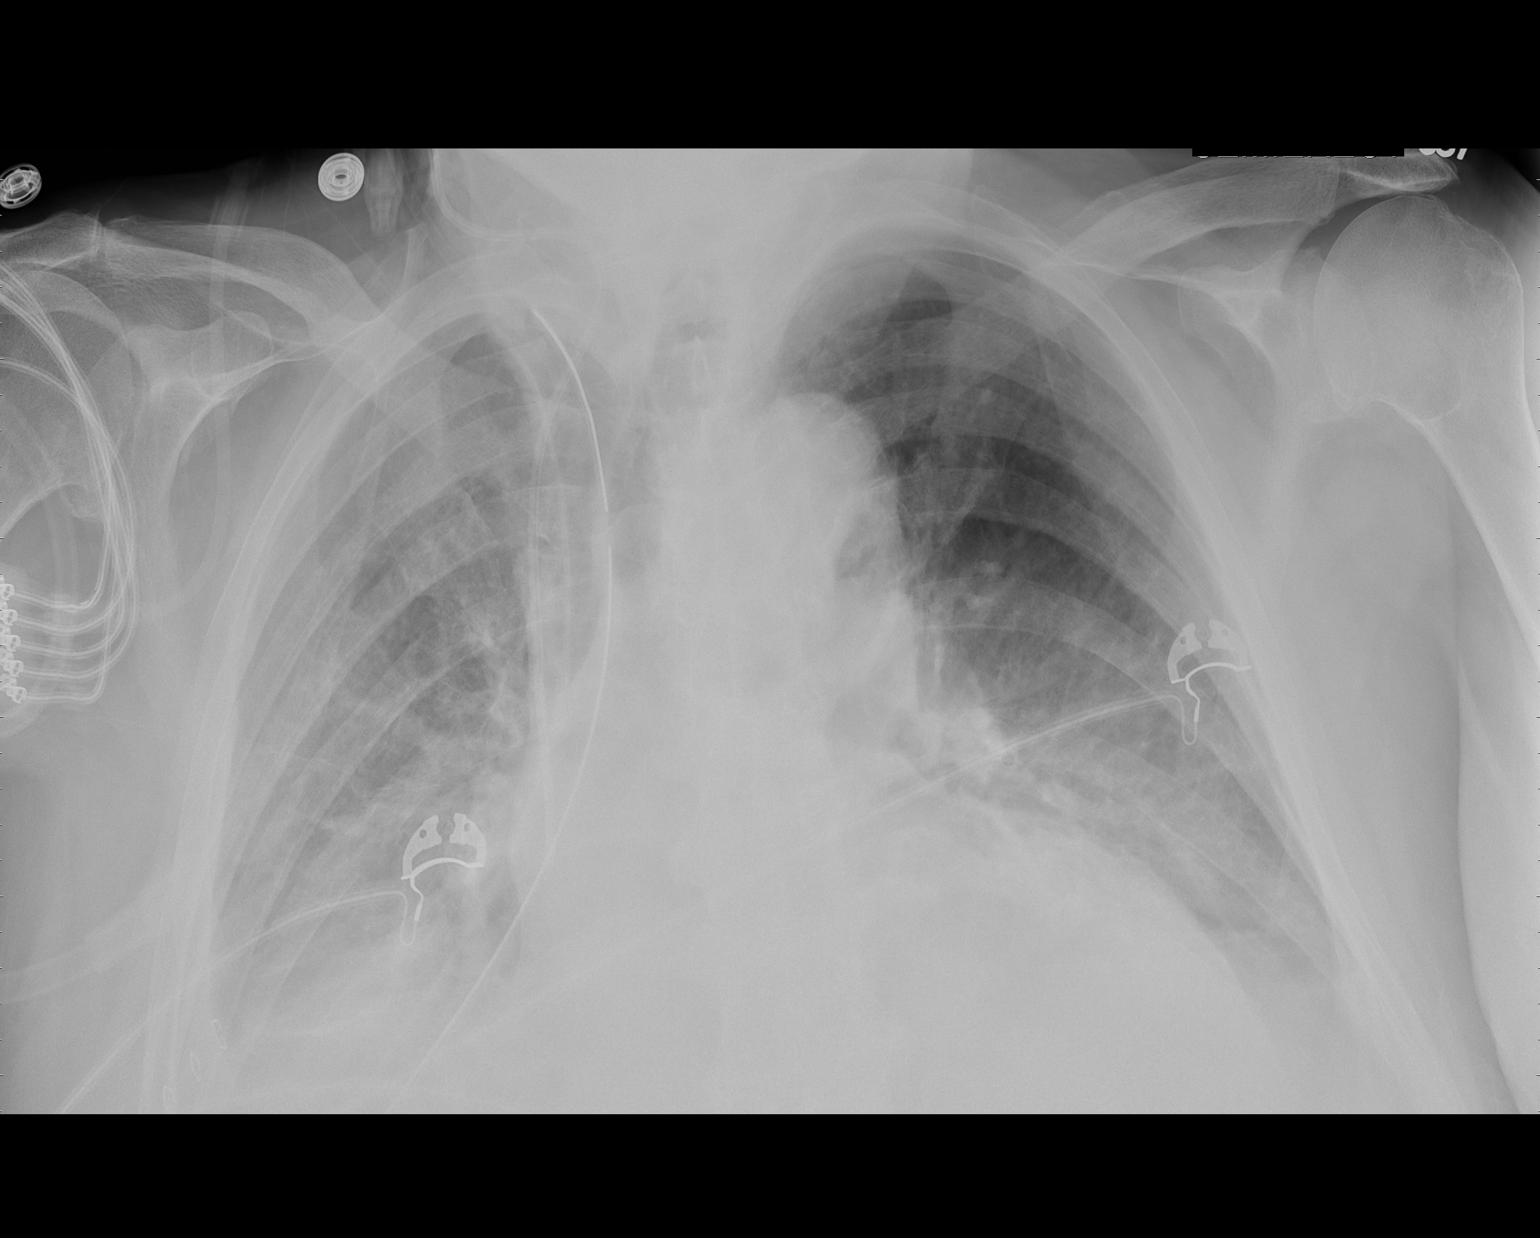

[1 of 1 positions shown; findings below may reference images not displayed]

FINDINGS: The right chest tube is unchanged in position. No
definite pneumothorax is seen.  Aeration has diminished slightly
with increase in basilar atelectasis with probable right effusion.
There is cardiomegaly present with question of mild pulmonary
vascular congestion.
IMPRESSION: Diminished aeration.  Right chest tube remains.  No definite
pneumothorax

## 2012-10-11 DIAGNOSIS — Z7901 Long term (current) use of anticoagulants: Secondary | ICD-10-CM | POA: Diagnosis not present

## 2012-10-12 IMAGING — CR DG CHEST 1V PORT
1 series · 1 of 1 positions shown · non-contrast
Comparison: 10/30/2011.

CLINICAL DATA: Right base atelectasis.

PORTABLE CHEST - 1 VIEW

[AP]
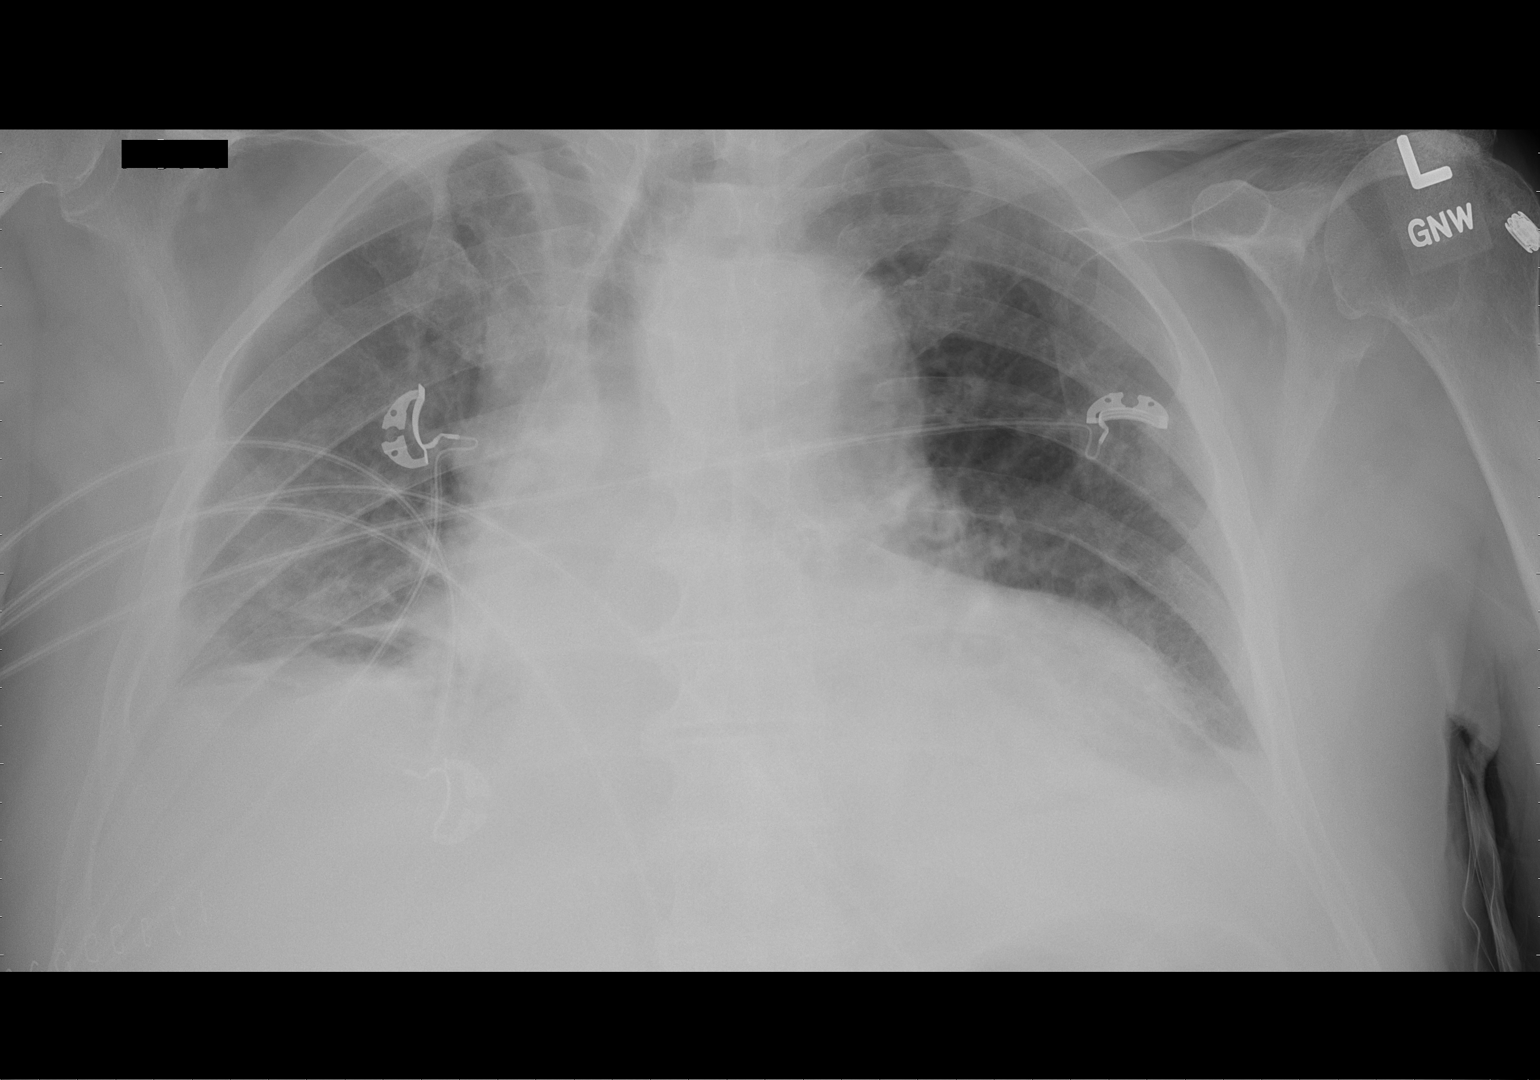

[1 of 1 positions shown; findings below may reference images not displayed]

FINDINGS: Right-sided pleural effusion with right base
consolidation noted.  This may represent pleural fluid with
atelectasis.  Infiltrate or mass not excluded.  Overall appearance
is similar to that of the prior exam.

Surgical staples right lateral chest wall.

Cardiomegaly.  Tortuous aorta.  Pulmonary vascular congestion.
Left base atelectasis.  There may be a tiny left-sided pleural
effusion.

Linear structures left apex probably represents overlying
structures rather than pneumothorax.
IMPRESSION: No significant change.

## 2012-10-13 IMAGING — CR DG CHEST 1V PORT
1 series · 1 of 1 positions shown · non-contrast
Comparison: 10/31/2011; 10/30/2011; 10/29/2011

CLINICAL DATA: Evaluate endotracheal tube

PORTABLE CHEST - 1 VIEW

[view not recorded]
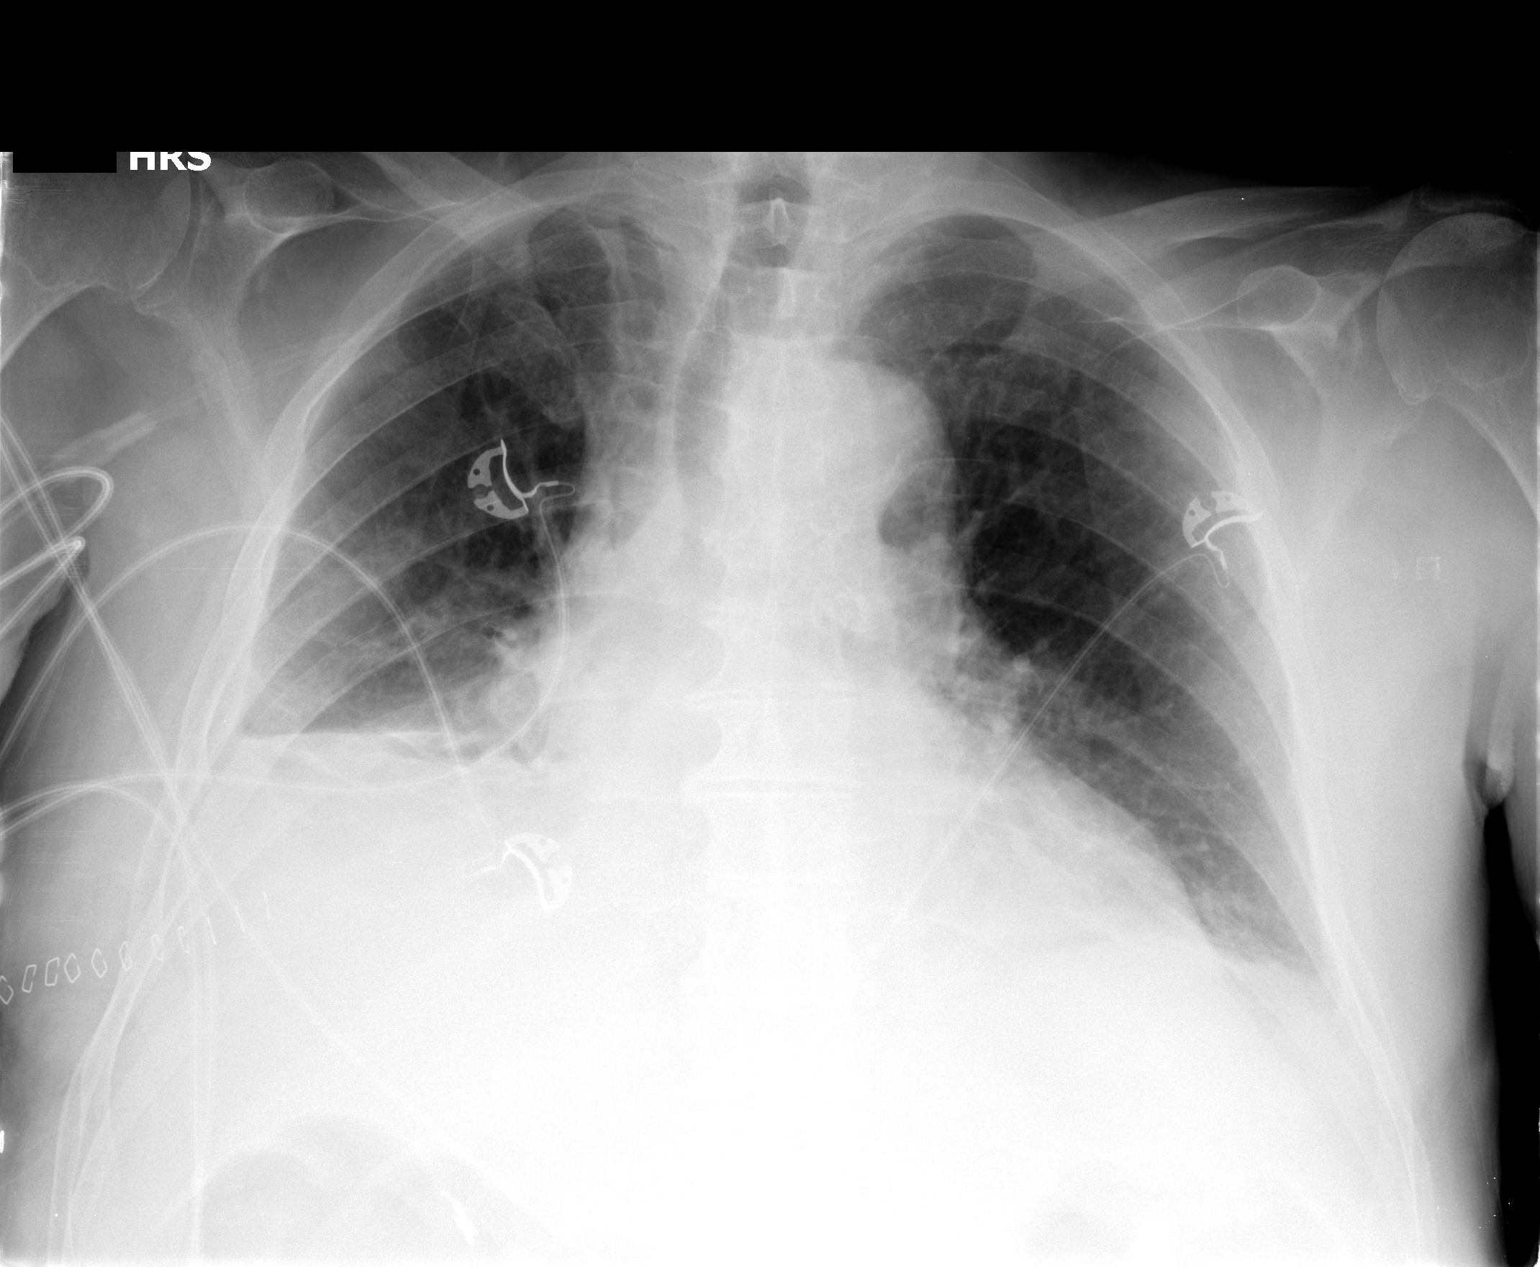

[1 of 1 positions shown; findings below may reference images not displayed]

FINDINGS: Grossly unchanged enlarged cardiac silhouette and
mediastinal contours with persistent obscuration of the heart in
right heart border secondary to grossly unchanged small right-sided
pleural effusion. Unchanged small left-sided pleural effusion.
Grossly unchanged right mid and lower lung as well as left lower
lung heterogeneous opacities.  Improved conspicuity of the
pulmonary vasculature.  No pneumothorax.  Unchanged bones.
IMPRESSION: 1.  Stable examination with findings of bilateral pleural effusions
and basilar opacities, right greater than left.

2. No support apparatus is identified on this examination.
Clinical correlation is advised.

## 2012-10-18 DIAGNOSIS — Z7901 Long term (current) use of anticoagulants: Secondary | ICD-10-CM | POA: Diagnosis not present

## 2012-10-22 DIAGNOSIS — C61 Malignant neoplasm of prostate: Secondary | ICD-10-CM | POA: Diagnosis not present

## 2012-10-29 DIAGNOSIS — Z7901 Long term (current) use of anticoagulants: Secondary | ICD-10-CM | POA: Diagnosis not present

## 2012-11-11 DIAGNOSIS — J3489 Other specified disorders of nose and nasal sinuses: Secondary | ICD-10-CM | POA: Diagnosis not present

## 2012-11-11 DIAGNOSIS — Z7901 Long term (current) use of anticoagulants: Secondary | ICD-10-CM | POA: Diagnosis not present

## 2012-11-17 DIAGNOSIS — D539 Nutritional anemia, unspecified: Secondary | ICD-10-CM | POA: Diagnosis not present

## 2012-11-17 DIAGNOSIS — M109 Gout, unspecified: Secondary | ICD-10-CM | POA: Diagnosis not present

## 2012-11-17 DIAGNOSIS — I10 Essential (primary) hypertension: Secondary | ICD-10-CM | POA: Diagnosis not present

## 2012-11-17 DIAGNOSIS — N400 Enlarged prostate without lower urinary tract symptoms: Secondary | ICD-10-CM | POA: Diagnosis not present

## 2012-11-17 DIAGNOSIS — Z79899 Other long term (current) drug therapy: Secondary | ICD-10-CM | POA: Diagnosis not present

## 2012-12-06 DIAGNOSIS — I499 Cardiac arrhythmia, unspecified: Secondary | ICD-10-CM | POA: Diagnosis not present

## 2012-12-06 DIAGNOSIS — M109 Gout, unspecified: Secondary | ICD-10-CM | POA: Diagnosis not present

## 2012-12-06 DIAGNOSIS — R5381 Other malaise: Secondary | ICD-10-CM | POA: Diagnosis not present

## 2012-12-06 DIAGNOSIS — R5383 Other fatigue: Secondary | ICD-10-CM | POA: Diagnosis not present

## 2012-12-13 DIAGNOSIS — Z7901 Long term (current) use of anticoagulants: Secondary | ICD-10-CM | POA: Diagnosis not present

## 2012-12-13 DIAGNOSIS — Z79899 Other long term (current) drug therapy: Secondary | ICD-10-CM | POA: Diagnosis not present

## 2012-12-31 DIAGNOSIS — J342 Deviated nasal septum: Secondary | ICD-10-CM | POA: Diagnosis not present

## 2012-12-31 DIAGNOSIS — H9190 Unspecified hearing loss, unspecified ear: Secondary | ICD-10-CM | POA: Diagnosis not present

## 2012-12-31 DIAGNOSIS — H6120 Impacted cerumen, unspecified ear: Secondary | ICD-10-CM | POA: Diagnosis not present

## 2013-01-10 DIAGNOSIS — M1990 Unspecified osteoarthritis, unspecified site: Secondary | ICD-10-CM | POA: Diagnosis not present

## 2013-01-10 DIAGNOSIS — Z7901 Long term (current) use of anticoagulants: Secondary | ICD-10-CM | POA: Diagnosis not present

## 2013-01-10 DIAGNOSIS — K1230 Oral mucositis (ulcerative), unspecified: Secondary | ICD-10-CM | POA: Diagnosis not present

## 2013-01-10 DIAGNOSIS — K121 Other forms of stomatitis: Secondary | ICD-10-CM | POA: Diagnosis not present

## 2013-01-11 DIAGNOSIS — L821 Other seborrheic keratosis: Secondary | ICD-10-CM | POA: Diagnosis not present

## 2013-01-11 DIAGNOSIS — L570 Actinic keratosis: Secondary | ICD-10-CM | POA: Diagnosis not present

## 2013-01-24 DIAGNOSIS — E785 Hyperlipidemia, unspecified: Secondary | ICD-10-CM | POA: Diagnosis not present

## 2013-01-24 DIAGNOSIS — I2510 Atherosclerotic heart disease of native coronary artery without angina pectoris: Secondary | ICD-10-CM | POA: Diagnosis not present

## 2013-01-24 DIAGNOSIS — I10 Essential (primary) hypertension: Secondary | ICD-10-CM | POA: Diagnosis not present

## 2013-01-24 DIAGNOSIS — I4891 Unspecified atrial fibrillation: Secondary | ICD-10-CM | POA: Diagnosis not present

## 2013-01-25 DIAGNOSIS — C61 Malignant neoplasm of prostate: Secondary | ICD-10-CM | POA: Diagnosis not present

## 2013-02-09 DIAGNOSIS — Z7901 Long term (current) use of anticoagulants: Secondary | ICD-10-CM | POA: Diagnosis not present

## 2013-02-11 DIAGNOSIS — N35919 Unspecified urethral stricture, male, unspecified site: Secondary | ICD-10-CM | POA: Diagnosis not present

## 2013-02-28 DIAGNOSIS — D473 Essential (hemorrhagic) thrombocythemia: Secondary | ICD-10-CM | POA: Diagnosis not present

## 2013-03-11 DIAGNOSIS — Z7901 Long term (current) use of anticoagulants: Secondary | ICD-10-CM | POA: Diagnosis not present

## 2013-03-24 DIAGNOSIS — Z7901 Long term (current) use of anticoagulants: Secondary | ICD-10-CM | POA: Diagnosis not present

## 2013-03-25 DIAGNOSIS — C61 Malignant neoplasm of prostate: Secondary | ICD-10-CM | POA: Diagnosis not present

## 2013-04-06 DIAGNOSIS — Z7901 Long term (current) use of anticoagulants: Secondary | ICD-10-CM | POA: Diagnosis not present

## 2013-04-11 DIAGNOSIS — H35349 Macular cyst, hole, or pseudohole, unspecified eye: Secondary | ICD-10-CM | POA: Diagnosis not present

## 2013-04-11 DIAGNOSIS — H16229 Keratoconjunctivitis sicca, not specified as Sjogren's, unspecified eye: Secondary | ICD-10-CM | POA: Diagnosis not present

## 2013-04-11 DIAGNOSIS — H539 Unspecified visual disturbance: Secondary | ICD-10-CM | POA: Diagnosis not present

## 2013-04-11 DIAGNOSIS — H353190 Nonexudative age-related macular degeneration, unspecified eye, stage unspecified: Secondary | ICD-10-CM | POA: Diagnosis not present

## 2013-05-06 DIAGNOSIS — Z7901 Long term (current) use of anticoagulants: Secondary | ICD-10-CM | POA: Diagnosis not present

## 2013-06-10 DIAGNOSIS — Z7901 Long term (current) use of anticoagulants: Secondary | ICD-10-CM | POA: Diagnosis not present

## 2013-06-24 DIAGNOSIS — C61 Malignant neoplasm of prostate: Secondary | ICD-10-CM | POA: Diagnosis not present

## 2013-07-06 DIAGNOSIS — Z23 Encounter for immunization: Secondary | ICD-10-CM | POA: Diagnosis not present

## 2013-07-06 DIAGNOSIS — Z7901 Long term (current) use of anticoagulants: Secondary | ICD-10-CM | POA: Diagnosis not present

## 2013-07-22 DIAGNOSIS — IMO0002 Reserved for concepts with insufficient information to code with codable children: Secondary | ICD-10-CM | POA: Diagnosis not present

## 2013-07-22 DIAGNOSIS — R9431 Abnormal electrocardiogram [ECG] [EKG]: Secondary | ICD-10-CM | POA: Diagnosis not present

## 2013-07-22 DIAGNOSIS — Z7901 Long term (current) use of anticoagulants: Secondary | ICD-10-CM | POA: Diagnosis not present

## 2013-07-22 DIAGNOSIS — R1110 Vomiting, unspecified: Secondary | ICD-10-CM | POA: Diagnosis not present

## 2013-07-22 DIAGNOSIS — K222 Esophageal obstruction: Secondary | ICD-10-CM | POA: Diagnosis not present

## 2013-07-22 DIAGNOSIS — T18108A Unspecified foreign body in esophagus causing other injury, initial encounter: Secondary | ICD-10-CM | POA: Diagnosis not present

## 2013-07-22 DIAGNOSIS — R791 Abnormal coagulation profile: Secondary | ICD-10-CM | POA: Diagnosis not present

## 2013-07-22 DIAGNOSIS — T17308A Unspecified foreign body in larynx causing other injury, initial encounter: Secondary | ICD-10-CM | POA: Diagnosis not present

## 2013-07-22 DIAGNOSIS — J984 Other disorders of lung: Secondary | ICD-10-CM | POA: Diagnosis not present

## 2013-07-25 DIAGNOSIS — J45901 Unspecified asthma with (acute) exacerbation: Secondary | ICD-10-CM | POA: Diagnosis not present

## 2013-07-25 DIAGNOSIS — G589 Mononeuropathy, unspecified: Secondary | ICD-10-CM | POA: Diagnosis not present

## 2013-07-27 DIAGNOSIS — R0602 Shortness of breath: Secondary | ICD-10-CM | POA: Diagnosis not present

## 2013-07-27 DIAGNOSIS — E785 Hyperlipidemia, unspecified: Secondary | ICD-10-CM | POA: Diagnosis not present

## 2013-07-27 DIAGNOSIS — I4891 Unspecified atrial fibrillation: Secondary | ICD-10-CM | POA: Diagnosis not present

## 2013-07-27 DIAGNOSIS — I2510 Atherosclerotic heart disease of native coronary artery without angina pectoris: Secondary | ICD-10-CM | POA: Diagnosis not present

## 2013-07-27 DIAGNOSIS — R0789 Other chest pain: Secondary | ICD-10-CM | POA: Diagnosis not present

## 2013-07-27 DIAGNOSIS — I4510 Unspecified right bundle-branch block: Secondary | ICD-10-CM | POA: Diagnosis not present

## 2013-07-27 DIAGNOSIS — Z7901 Long term (current) use of anticoagulants: Secondary | ICD-10-CM | POA: Diagnosis not present

## 2013-07-27 DIAGNOSIS — D473 Essential (hemorrhagic) thrombocythemia: Secondary | ICD-10-CM | POA: Diagnosis not present

## 2013-07-27 DIAGNOSIS — I10 Essential (primary) hypertension: Secondary | ICD-10-CM | POA: Diagnosis not present

## 2013-07-27 DIAGNOSIS — J9819 Other pulmonary collapse: Secondary | ICD-10-CM | POA: Diagnosis not present

## 2013-07-27 DIAGNOSIS — R079 Chest pain, unspecified: Secondary | ICD-10-CM | POA: Diagnosis not present

## 2013-07-27 DIAGNOSIS — R110 Nausea: Secondary | ICD-10-CM | POA: Diagnosis not present

## 2013-07-27 DIAGNOSIS — J449 Chronic obstructive pulmonary disease, unspecified: Secondary | ICD-10-CM | POA: Diagnosis not present

## 2013-07-27 DIAGNOSIS — K219 Gastro-esophageal reflux disease without esophagitis: Secondary | ICD-10-CM | POA: Diagnosis not present

## 2013-07-27 DIAGNOSIS — Z79899 Other long term (current) drug therapy: Secondary | ICD-10-CM | POA: Diagnosis not present

## 2013-07-28 DIAGNOSIS — R0789 Other chest pain: Secondary | ICD-10-CM | POA: Diagnosis not present

## 2013-08-08 DIAGNOSIS — M542 Cervicalgia: Secondary | ICD-10-CM | POA: Diagnosis not present

## 2013-08-08 DIAGNOSIS — Z0000 Encounter for general adult medical examination without abnormal findings: Secondary | ICD-10-CM | POA: Diagnosis not present

## 2013-08-15 DIAGNOSIS — I10 Essential (primary) hypertension: Secondary | ICD-10-CM | POA: Diagnosis not present

## 2013-08-15 DIAGNOSIS — I4891 Unspecified atrial fibrillation: Secondary | ICD-10-CM | POA: Diagnosis not present

## 2013-08-15 DIAGNOSIS — I2510 Atherosclerotic heart disease of native coronary artery without angina pectoris: Secondary | ICD-10-CM | POA: Diagnosis not present

## 2013-08-15 DIAGNOSIS — I359 Nonrheumatic aortic valve disorder, unspecified: Secondary | ICD-10-CM | POA: Diagnosis not present

## 2013-08-15 DIAGNOSIS — E785 Hyperlipidemia, unspecified: Secondary | ICD-10-CM | POA: Diagnosis not present

## 2013-08-23 DIAGNOSIS — R040 Epistaxis: Secondary | ICD-10-CM | POA: Diagnosis not present

## 2013-08-23 DIAGNOSIS — Z7901 Long term (current) use of anticoagulants: Secondary | ICD-10-CM | POA: Diagnosis not present

## 2013-08-27 DIAGNOSIS — I4891 Unspecified atrial fibrillation: Secondary | ICD-10-CM | POA: Diagnosis not present

## 2013-08-27 DIAGNOSIS — E785 Hyperlipidemia, unspecified: Secondary | ICD-10-CM | POA: Diagnosis not present

## 2013-08-27 DIAGNOSIS — K219 Gastro-esophageal reflux disease without esophagitis: Secondary | ICD-10-CM | POA: Diagnosis not present

## 2013-08-27 DIAGNOSIS — R079 Chest pain, unspecified: Secondary | ICD-10-CM | POA: Diagnosis not present

## 2013-08-29 DIAGNOSIS — I4891 Unspecified atrial fibrillation: Secondary | ICD-10-CM | POA: Diagnosis not present

## 2013-08-29 DIAGNOSIS — Z7901 Long term (current) use of anticoagulants: Secondary | ICD-10-CM | POA: Diagnosis not present

## 2013-08-29 DIAGNOSIS — J342 Deviated nasal septum: Secondary | ICD-10-CM | POA: Diagnosis not present

## 2013-08-29 DIAGNOSIS — R040 Epistaxis: Secondary | ICD-10-CM | POA: Diagnosis not present

## 2013-09-22 DIAGNOSIS — Z7901 Long term (current) use of anticoagulants: Secondary | ICD-10-CM | POA: Diagnosis not present

## 2013-09-30 DIAGNOSIS — R300 Dysuria: Secondary | ICD-10-CM | POA: Diagnosis not present

## 2013-09-30 DIAGNOSIS — C61 Malignant neoplasm of prostate: Secondary | ICD-10-CM | POA: Diagnosis not present

## 2013-10-04 DIAGNOSIS — M1990 Unspecified osteoarthritis, unspecified site: Secondary | ICD-10-CM | POA: Diagnosis not present

## 2013-10-04 DIAGNOSIS — G479 Sleep disorder, unspecified: Secondary | ICD-10-CM | POA: Diagnosis not present

## 2013-10-04 DIAGNOSIS — I4891 Unspecified atrial fibrillation: Secondary | ICD-10-CM | POA: Diagnosis not present

## 2013-10-04 DIAGNOSIS — Z7901 Long term (current) use of anticoagulants: Secondary | ICD-10-CM | POA: Diagnosis not present

## 2013-10-11 DIAGNOSIS — J019 Acute sinusitis, unspecified: Secondary | ICD-10-CM | POA: Diagnosis not present

## 2013-10-13 DIAGNOSIS — M5412 Radiculopathy, cervical region: Secondary | ICD-10-CM | POA: Diagnosis not present

## 2013-10-13 DIAGNOSIS — M5030 Other cervical disc degeneration, unspecified cervical region: Secondary | ICD-10-CM | POA: Diagnosis not present

## 2013-10-13 DIAGNOSIS — M4802 Spinal stenosis, cervical region: Secondary | ICD-10-CM | POA: Diagnosis not present

## 2013-10-15 DIAGNOSIS — G4730 Sleep apnea, unspecified: Secondary | ICD-10-CM | POA: Diagnosis not present

## 2013-10-15 DIAGNOSIS — G479 Sleep disorder, unspecified: Secondary | ICD-10-CM | POA: Diagnosis not present

## 2013-10-24 DIAGNOSIS — H903 Sensorineural hearing loss, bilateral: Secondary | ICD-10-CM | POA: Diagnosis not present

## 2013-10-24 DIAGNOSIS — J328 Other chronic sinusitis: Secondary | ICD-10-CM | POA: Diagnosis not present

## 2013-10-27 DIAGNOSIS — Z7901 Long term (current) use of anticoagulants: Secondary | ICD-10-CM | POA: Diagnosis not present

## 2013-11-15 DIAGNOSIS — J328 Other chronic sinusitis: Secondary | ICD-10-CM | POA: Diagnosis not present

## 2013-11-15 DIAGNOSIS — J309 Allergic rhinitis, unspecified: Secondary | ICD-10-CM | POA: Diagnosis not present

## 2013-11-15 DIAGNOSIS — J343 Hypertrophy of nasal turbinates: Secondary | ICD-10-CM | POA: Diagnosis not present

## 2013-12-02 DIAGNOSIS — Z7901 Long term (current) use of anticoagulants: Secondary | ICD-10-CM | POA: Diagnosis not present

## 2013-12-06 DIAGNOSIS — C61 Malignant neoplasm of prostate: Secondary | ICD-10-CM | POA: Diagnosis not present

## 2013-12-06 DIAGNOSIS — R300 Dysuria: Secondary | ICD-10-CM | POA: Diagnosis not present

## 2013-12-09 DIAGNOSIS — M4802 Spinal stenosis, cervical region: Secondary | ICD-10-CM | POA: Diagnosis not present

## 2013-12-09 DIAGNOSIS — Z79899 Other long term (current) drug therapy: Secondary | ICD-10-CM | POA: Diagnosis not present

## 2013-12-09 DIAGNOSIS — M5412 Radiculopathy, cervical region: Secondary | ICD-10-CM | POA: Diagnosis not present

## 2013-12-09 DIAGNOSIS — M5030 Other cervical disc degeneration, unspecified cervical region: Secondary | ICD-10-CM | POA: Diagnosis not present

## 2013-12-27 DIAGNOSIS — R0602 Shortness of breath: Secondary | ICD-10-CM | POA: Diagnosis not present

## 2013-12-27 DIAGNOSIS — G479 Sleep disorder, unspecified: Secondary | ICD-10-CM | POA: Diagnosis not present

## 2013-12-27 DIAGNOSIS — G2581 Restless legs syndrome: Secondary | ICD-10-CM | POA: Diagnosis not present

## 2013-12-27 DIAGNOSIS — G4734 Idiopathic sleep related nonobstructive alveolar hypoventilation: Secondary | ICD-10-CM | POA: Diagnosis not present

## 2013-12-27 DIAGNOSIS — Z7901 Long term (current) use of anticoagulants: Secondary | ICD-10-CM | POA: Diagnosis not present

## 2013-12-27 DIAGNOSIS — R0609 Other forms of dyspnea: Secondary | ICD-10-CM | POA: Diagnosis not present

## 2013-12-27 DIAGNOSIS — G4700 Insomnia, unspecified: Secondary | ICD-10-CM | POA: Diagnosis not present

## 2013-12-29 DIAGNOSIS — N35919 Unspecified urethral stricture, male, unspecified site: Secondary | ICD-10-CM | POA: Diagnosis not present

## 2014-01-09 DIAGNOSIS — D539 Nutritional anemia, unspecified: Secondary | ICD-10-CM | POA: Diagnosis not present

## 2014-01-09 DIAGNOSIS — Z79899 Other long term (current) drug therapy: Secondary | ICD-10-CM | POA: Diagnosis not present

## 2014-01-09 DIAGNOSIS — J3489 Other specified disorders of nose and nasal sinuses: Secondary | ICD-10-CM | POA: Diagnosis not present

## 2014-01-09 DIAGNOSIS — R5383 Other fatigue: Secondary | ICD-10-CM | POA: Diagnosis not present

## 2014-01-09 DIAGNOSIS — M899 Disorder of bone, unspecified: Secondary | ICD-10-CM | POA: Diagnosis not present

## 2014-01-09 DIAGNOSIS — M109 Gout, unspecified: Secondary | ICD-10-CM | POA: Diagnosis not present

## 2014-01-09 DIAGNOSIS — Z23 Encounter for immunization: Secondary | ICD-10-CM | POA: Diagnosis not present

## 2014-01-09 DIAGNOSIS — E559 Vitamin D deficiency, unspecified: Secondary | ICD-10-CM | POA: Diagnosis not present

## 2014-01-09 DIAGNOSIS — E7800 Pure hypercholesterolemia, unspecified: Secondary | ICD-10-CM | POA: Diagnosis not present

## 2014-01-09 DIAGNOSIS — R5381 Other malaise: Secondary | ICD-10-CM | POA: Diagnosis not present

## 2014-01-17 DIAGNOSIS — M109 Gout, unspecified: Secondary | ICD-10-CM | POA: Diagnosis not present

## 2014-01-17 DIAGNOSIS — D649 Anemia, unspecified: Secondary | ICD-10-CM | POA: Diagnosis not present

## 2014-01-17 DIAGNOSIS — D539 Nutritional anemia, unspecified: Secondary | ICD-10-CM | POA: Diagnosis not present

## 2014-01-18 DIAGNOSIS — M4802 Spinal stenosis, cervical region: Secondary | ICD-10-CM | POA: Diagnosis not present

## 2014-01-18 DIAGNOSIS — M5030 Other cervical disc degeneration, unspecified cervical region: Secondary | ICD-10-CM | POA: Diagnosis not present

## 2014-01-18 DIAGNOSIS — M5412 Radiculopathy, cervical region: Secondary | ICD-10-CM | POA: Diagnosis not present

## 2014-01-20 DIAGNOSIS — L821 Other seborrheic keratosis: Secondary | ICD-10-CM | POA: Diagnosis not present

## 2014-01-20 DIAGNOSIS — L570 Actinic keratosis: Secondary | ICD-10-CM | POA: Diagnosis not present

## 2014-01-30 DIAGNOSIS — D500 Iron deficiency anemia secondary to blood loss (chronic): Secondary | ICD-10-CM | POA: Diagnosis not present

## 2014-01-30 DIAGNOSIS — Z86010 Personal history of colonic polyps: Secondary | ICD-10-CM | POA: Diagnosis not present

## 2014-01-30 DIAGNOSIS — K5730 Diverticulosis of large intestine without perforation or abscess without bleeding: Secondary | ICD-10-CM | POA: Diagnosis not present

## 2014-01-30 DIAGNOSIS — K222 Esophageal obstruction: Secondary | ICD-10-CM | POA: Diagnosis not present

## 2014-02-06 DIAGNOSIS — Z7901 Long term (current) use of anticoagulants: Secondary | ICD-10-CM | POA: Diagnosis not present

## 2014-02-07 DIAGNOSIS — Z79899 Other long term (current) drug therapy: Secondary | ICD-10-CM | POA: Diagnosis not present

## 2014-02-07 DIAGNOSIS — D509 Iron deficiency anemia, unspecified: Secondary | ICD-10-CM | POA: Diagnosis not present

## 2014-02-07 DIAGNOSIS — K449 Diaphragmatic hernia without obstruction or gangrene: Secondary | ICD-10-CM | POA: Diagnosis not present

## 2014-02-07 DIAGNOSIS — K648 Other hemorrhoids: Secondary | ICD-10-CM | POA: Diagnosis not present

## 2014-02-07 DIAGNOSIS — J45909 Unspecified asthma, uncomplicated: Secondary | ICD-10-CM | POA: Diagnosis not present

## 2014-02-07 DIAGNOSIS — K222 Esophageal obstruction: Secondary | ICD-10-CM | POA: Diagnosis not present

## 2014-02-07 DIAGNOSIS — Z8711 Personal history of peptic ulcer disease: Secondary | ICD-10-CM | POA: Diagnosis not present

## 2014-02-07 DIAGNOSIS — K5730 Diverticulosis of large intestine without perforation or abscess without bleeding: Secondary | ICD-10-CM | POA: Diagnosis not present

## 2014-02-07 DIAGNOSIS — E7800 Pure hypercholesterolemia, unspecified: Secondary | ICD-10-CM | POA: Diagnosis not present

## 2014-02-07 DIAGNOSIS — Z86010 Personal history of colonic polyps: Secondary | ICD-10-CM | POA: Diagnosis not present

## 2014-02-07 DIAGNOSIS — Z8546 Personal history of malignant neoplasm of prostate: Secondary | ICD-10-CM | POA: Diagnosis not present

## 2014-02-07 DIAGNOSIS — I10 Essential (primary) hypertension: Secondary | ICD-10-CM | POA: Diagnosis not present

## 2014-02-07 DIAGNOSIS — Z7901 Long term (current) use of anticoagulants: Secondary | ICD-10-CM | POA: Diagnosis not present

## 2014-02-07 DIAGNOSIS — K219 Gastro-esophageal reflux disease without esophagitis: Secondary | ICD-10-CM | POA: Diagnosis not present

## 2014-02-07 DIAGNOSIS — M129 Arthropathy, unspecified: Secondary | ICD-10-CM | POA: Diagnosis not present

## 2014-02-07 DIAGNOSIS — D649 Anemia, unspecified: Secondary | ICD-10-CM | POA: Diagnosis not present

## 2014-02-07 DIAGNOSIS — Z7982 Long term (current) use of aspirin: Secondary | ICD-10-CM | POA: Diagnosis not present

## 2014-02-07 DIAGNOSIS — I519 Heart disease, unspecified: Secondary | ICD-10-CM | POA: Diagnosis not present

## 2014-02-08 DIAGNOSIS — C4401 Basal cell carcinoma of skin of lip: Secondary | ICD-10-CM | POA: Diagnosis not present

## 2014-03-02 DIAGNOSIS — Z7901 Long term (current) use of anticoagulants: Secondary | ICD-10-CM | POA: Diagnosis not present

## 2014-03-02 DIAGNOSIS — Z79899 Other long term (current) drug therapy: Secondary | ICD-10-CM | POA: Diagnosis not present

## 2014-03-06 DIAGNOSIS — D649 Anemia, unspecified: Secondary | ICD-10-CM | POA: Diagnosis not present

## 2014-03-09 DIAGNOSIS — R1310 Dysphagia, unspecified: Secondary | ICD-10-CM | POA: Diagnosis not present

## 2014-03-09 DIAGNOSIS — K222 Esophageal obstruction: Secondary | ICD-10-CM | POA: Diagnosis not present

## 2014-03-09 DIAGNOSIS — D509 Iron deficiency anemia, unspecified: Secondary | ICD-10-CM | POA: Diagnosis not present

## 2014-03-09 DIAGNOSIS — K5730 Diverticulosis of large intestine without perforation or abscess without bleeding: Secondary | ICD-10-CM | POA: Diagnosis not present

## 2014-03-15 DIAGNOSIS — M4802 Spinal stenosis, cervical region: Secondary | ICD-10-CM | POA: Diagnosis not present

## 2014-03-15 DIAGNOSIS — G2581 Restless legs syndrome: Secondary | ICD-10-CM | POA: Diagnosis not present

## 2014-03-15 DIAGNOSIS — M5030 Other cervical disc degeneration, unspecified cervical region: Secondary | ICD-10-CM | POA: Diagnosis not present

## 2014-03-16 DIAGNOSIS — D509 Iron deficiency anemia, unspecified: Secondary | ICD-10-CM | POA: Diagnosis not present

## 2014-03-16 DIAGNOSIS — I2510 Atherosclerotic heart disease of native coronary artery without angina pectoris: Secondary | ICD-10-CM | POA: Diagnosis not present

## 2014-03-16 DIAGNOSIS — I4891 Unspecified atrial fibrillation: Secondary | ICD-10-CM | POA: Diagnosis not present

## 2014-03-16 DIAGNOSIS — I359 Nonrheumatic aortic valve disorder, unspecified: Secondary | ICD-10-CM | POA: Diagnosis not present

## 2014-03-16 DIAGNOSIS — E785 Hyperlipidemia, unspecified: Secondary | ICD-10-CM | POA: Diagnosis not present

## 2014-03-16 DIAGNOSIS — I10 Essential (primary) hypertension: Secondary | ICD-10-CM | POA: Diagnosis not present

## 2014-03-16 DIAGNOSIS — Z7901 Long term (current) use of anticoagulants: Secondary | ICD-10-CM | POA: Diagnosis not present

## 2014-03-17 DIAGNOSIS — D473 Essential (hemorrhagic) thrombocythemia: Secondary | ICD-10-CM | POA: Diagnosis not present

## 2014-03-17 DIAGNOSIS — Z09 Encounter for follow-up examination after completed treatment for conditions other than malignant neoplasm: Secondary | ICD-10-CM | POA: Diagnosis not present

## 2014-03-21 DIAGNOSIS — M1710 Unilateral primary osteoarthritis, unspecified knee: Secondary | ICD-10-CM | POA: Diagnosis not present

## 2014-03-21 DIAGNOSIS — IMO0002 Reserved for concepts with insufficient information to code with codable children: Secondary | ICD-10-CM | POA: Diagnosis not present

## 2014-03-21 DIAGNOSIS — M25569 Pain in unspecified knee: Secondary | ICD-10-CM | POA: Diagnosis not present

## 2014-03-29 DIAGNOSIS — Z7901 Long term (current) use of anticoagulants: Secondary | ICD-10-CM | POA: Diagnosis not present

## 2014-04-10 DIAGNOSIS — D649 Anemia, unspecified: Secondary | ICD-10-CM | POA: Diagnosis not present

## 2014-04-21 DIAGNOSIS — C61 Malignant neoplasm of prostate: Secondary | ICD-10-CM | POA: Diagnosis not present

## 2014-04-27 DIAGNOSIS — Z7901 Long term (current) use of anticoagulants: Secondary | ICD-10-CM | POA: Diagnosis not present

## 2014-05-11 DIAGNOSIS — L02419 Cutaneous abscess of limb, unspecified: Secondary | ICD-10-CM | POA: Diagnosis not present

## 2014-05-11 DIAGNOSIS — M7989 Other specified soft tissue disorders: Secondary | ICD-10-CM | POA: Diagnosis not present

## 2014-05-11 DIAGNOSIS — M25569 Pain in unspecified knee: Secondary | ICD-10-CM | POA: Diagnosis not present

## 2014-05-11 DIAGNOSIS — M25469 Effusion, unspecified knee: Secondary | ICD-10-CM | POA: Diagnosis not present

## 2014-05-25 DIAGNOSIS — D649 Anemia, unspecified: Secondary | ICD-10-CM | POA: Diagnosis not present

## 2014-05-26 DIAGNOSIS — J45909 Unspecified asthma, uncomplicated: Secondary | ICD-10-CM | POA: Diagnosis not present

## 2014-05-31 DIAGNOSIS — Z7901 Long term (current) use of anticoagulants: Secondary | ICD-10-CM | POA: Diagnosis not present

## 2014-06-14 DIAGNOSIS — R141 Gas pain: Secondary | ICD-10-CM | POA: Diagnosis not present

## 2014-06-14 DIAGNOSIS — R143 Flatulence: Secondary | ICD-10-CM | POA: Diagnosis not present

## 2014-06-14 DIAGNOSIS — T18108A Unspecified foreign body in esophagus causing other injury, initial encounter: Secondary | ICD-10-CM | POA: Diagnosis not present

## 2014-06-14 DIAGNOSIS — K5730 Diverticulosis of large intestine without perforation or abscess without bleeding: Secondary | ICD-10-CM | POA: Diagnosis not present

## 2014-06-14 DIAGNOSIS — K222 Esophageal obstruction: Secondary | ICD-10-CM | POA: Diagnosis not present

## 2014-06-16 DIAGNOSIS — D649 Anemia, unspecified: Secondary | ICD-10-CM | POA: Diagnosis not present

## 2014-06-21 DIAGNOSIS — K5730 Diverticulosis of large intestine without perforation or abscess without bleeding: Secondary | ICD-10-CM | POA: Diagnosis not present

## 2014-06-21 DIAGNOSIS — J3089 Other allergic rhinitis: Secondary | ICD-10-CM | POA: Diagnosis not present

## 2014-06-21 DIAGNOSIS — K921 Melena: Secondary | ICD-10-CM | POA: Diagnosis not present

## 2014-06-21 DIAGNOSIS — Z23 Encounter for immunization: Secondary | ICD-10-CM | POA: Diagnosis not present

## 2014-06-26 DIAGNOSIS — D649 Anemia, unspecified: Secondary | ICD-10-CM | POA: Diagnosis not present

## 2014-07-10 DIAGNOSIS — Z7901 Long term (current) use of anticoagulants: Secondary | ICD-10-CM | POA: Diagnosis not present

## 2014-07-10 DIAGNOSIS — K5730 Diverticulosis of large intestine without perforation or abscess without bleeding: Secondary | ICD-10-CM | POA: Diagnosis not present

## 2014-07-10 DIAGNOSIS — Z86010 Personal history of colonic polyps: Secondary | ICD-10-CM | POA: Diagnosis not present

## 2014-07-10 DIAGNOSIS — D509 Iron deficiency anemia, unspecified: Secondary | ICD-10-CM | POA: Diagnosis not present

## 2014-07-10 DIAGNOSIS — K222 Esophageal obstruction: Secondary | ICD-10-CM | POA: Diagnosis not present

## 2014-07-13 DIAGNOSIS — M4802 Spinal stenosis, cervical region: Secondary | ICD-10-CM | POA: Diagnosis not present

## 2014-07-13 DIAGNOSIS — G2581 Restless legs syndrome: Secondary | ICD-10-CM | POA: Diagnosis not present

## 2014-07-13 DIAGNOSIS — M5030 Other cervical disc degeneration, unspecified cervical region: Secondary | ICD-10-CM | POA: Diagnosis not present

## 2014-07-24 DIAGNOSIS — D649 Anemia, unspecified: Secondary | ICD-10-CM | POA: Diagnosis not present

## 2014-07-28 DIAGNOSIS — C61 Malignant neoplasm of prostate: Secondary | ICD-10-CM | POA: Diagnosis not present

## 2014-08-08 DIAGNOSIS — H539 Unspecified visual disturbance: Secondary | ICD-10-CM | POA: Diagnosis not present

## 2014-08-08 DIAGNOSIS — H02831 Dermatochalasis of right upper eyelid: Secondary | ICD-10-CM | POA: Diagnosis not present

## 2014-08-08 DIAGNOSIS — H3531 Nonexudative age-related macular degeneration: Secondary | ICD-10-CM | POA: Diagnosis not present

## 2014-08-08 DIAGNOSIS — H35343 Macular cyst, hole, or pseudohole, bilateral: Secondary | ICD-10-CM | POA: Diagnosis not present

## 2014-08-08 DIAGNOSIS — H02834 Dermatochalasis of left upper eyelid: Secondary | ICD-10-CM | POA: Diagnosis not present

## 2014-08-10 DIAGNOSIS — Z7901 Long term (current) use of anticoagulants: Secondary | ICD-10-CM | POA: Diagnosis not present

## 2014-08-23 DIAGNOSIS — R0602 Shortness of breath: Secondary | ICD-10-CM | POA: Diagnosis not present

## 2014-08-23 DIAGNOSIS — I480 Paroxysmal atrial fibrillation: Secondary | ICD-10-CM | POA: Diagnosis not present

## 2014-08-23 DIAGNOSIS — I10 Essential (primary) hypertension: Secondary | ICD-10-CM | POA: Diagnosis not present

## 2014-08-23 DIAGNOSIS — I2510 Atherosclerotic heart disease of native coronary artery without angina pectoris: Secondary | ICD-10-CM | POA: Diagnosis not present

## 2014-08-23 DIAGNOSIS — Z7901 Long term (current) use of anticoagulants: Secondary | ICD-10-CM | POA: Diagnosis not present

## 2014-08-23 DIAGNOSIS — E785 Hyperlipidemia, unspecified: Secondary | ICD-10-CM | POA: Diagnosis not present

## 2014-08-23 DIAGNOSIS — I350 Nonrheumatic aortic (valve) stenosis: Secondary | ICD-10-CM | POA: Diagnosis not present

## 2014-08-23 DIAGNOSIS — I359 Nonrheumatic aortic valve disorder, unspecified: Secondary | ICD-10-CM | POA: Diagnosis not present

## 2014-08-24 DIAGNOSIS — L245 Irritant contact dermatitis due to other chemical products: Secondary | ICD-10-CM | POA: Diagnosis not present

## 2014-08-24 DIAGNOSIS — D485 Neoplasm of uncertain behavior of skin: Secondary | ICD-10-CM | POA: Diagnosis not present

## 2014-09-07 DIAGNOSIS — I4891 Unspecified atrial fibrillation: Secondary | ICD-10-CM | POA: Diagnosis not present

## 2014-09-07 DIAGNOSIS — I059 Rheumatic mitral valve disease, unspecified: Secondary | ICD-10-CM | POA: Diagnosis not present

## 2014-09-07 DIAGNOSIS — I359 Nonrheumatic aortic valve disorder, unspecified: Secondary | ICD-10-CM | POA: Diagnosis not present

## 2014-09-07 DIAGNOSIS — R0602 Shortness of breath: Secondary | ICD-10-CM | POA: Diagnosis not present

## 2014-09-08 DIAGNOSIS — C44529 Squamous cell carcinoma of skin of other part of trunk: Secondary | ICD-10-CM | POA: Diagnosis not present

## 2014-09-08 DIAGNOSIS — L570 Actinic keratosis: Secondary | ICD-10-CM | POA: Diagnosis not present

## 2014-09-13 DIAGNOSIS — K5730 Diverticulosis of large intestine without perforation or abscess without bleeding: Secondary | ICD-10-CM | POA: Diagnosis not present

## 2014-09-13 DIAGNOSIS — Z7901 Long term (current) use of anticoagulants: Secondary | ICD-10-CM | POA: Diagnosis not present

## 2014-09-13 DIAGNOSIS — K222 Esophageal obstruction: Secondary | ICD-10-CM | POA: Diagnosis not present

## 2014-09-14 DIAGNOSIS — J189 Pneumonia, unspecified organism: Secondary | ICD-10-CM | POA: Diagnosis not present

## 2014-09-14 DIAGNOSIS — H10023 Other mucopurulent conjunctivitis, bilateral: Secondary | ICD-10-CM | POA: Diagnosis not present

## 2014-09-15 DIAGNOSIS — D473 Essential (hemorrhagic) thrombocythemia: Secondary | ICD-10-CM | POA: Diagnosis not present

## 2014-09-18 DIAGNOSIS — D509 Iron deficiency anemia, unspecified: Secondary | ICD-10-CM | POA: Diagnosis not present

## 2014-09-25 DIAGNOSIS — D473 Essential (hemorrhagic) thrombocythemia: Secondary | ICD-10-CM | POA: Diagnosis not present

## 2014-09-25 DIAGNOSIS — D509 Iron deficiency anemia, unspecified: Secondary | ICD-10-CM | POA: Diagnosis not present

## 2014-09-26 DIAGNOSIS — I4892 Unspecified atrial flutter: Secondary | ICD-10-CM | POA: Diagnosis not present

## 2014-09-26 DIAGNOSIS — M109 Gout, unspecified: Secondary | ICD-10-CM | POA: Diagnosis not present

## 2014-09-26 DIAGNOSIS — J302 Other seasonal allergic rhinitis: Secondary | ICD-10-CM | POA: Diagnosis not present

## 2014-09-26 DIAGNOSIS — Z7901 Long term (current) use of anticoagulants: Secondary | ICD-10-CM | POA: Diagnosis not present

## 2014-09-26 DIAGNOSIS — D649 Anemia, unspecified: Secondary | ICD-10-CM | POA: Diagnosis not present

## 2014-11-07 DIAGNOSIS — Z7901 Long term (current) use of anticoagulants: Secondary | ICD-10-CM | POA: Diagnosis not present

## 2014-11-09 DIAGNOSIS — L219 Seborrheic dermatitis, unspecified: Secondary | ICD-10-CM | POA: Diagnosis not present

## 2014-11-09 DIAGNOSIS — L570 Actinic keratosis: Secondary | ICD-10-CM | POA: Diagnosis not present

## 2014-11-09 DIAGNOSIS — L821 Other seborrheic keratosis: Secondary | ICD-10-CM | POA: Diagnosis not present

## 2014-11-11 DIAGNOSIS — K922 Gastrointestinal hemorrhage, unspecified: Secondary | ICD-10-CM | POA: Diagnosis not present

## 2014-11-11 DIAGNOSIS — D473 Essential (hemorrhagic) thrombocythemia: Secondary | ICD-10-CM | POA: Diagnosis not present

## 2014-11-11 DIAGNOSIS — I25119 Atherosclerotic heart disease of native coronary artery with unspecified angina pectoris: Secondary | ICD-10-CM | POA: Diagnosis present

## 2014-11-11 DIAGNOSIS — R531 Weakness: Secondary | ICD-10-CM | POA: Diagnosis not present

## 2014-11-11 DIAGNOSIS — Z7901 Long term (current) use of anticoagulants: Secondary | ICD-10-CM | POA: Diagnosis not present

## 2014-11-11 DIAGNOSIS — K5790 Diverticulosis of intestine, part unspecified, without perforation or abscess without bleeding: Secondary | ICD-10-CM | POA: Diagnosis not present

## 2014-11-11 DIAGNOSIS — E780 Pure hypercholesterolemia: Secondary | ICD-10-CM | POA: Diagnosis present

## 2014-11-11 DIAGNOSIS — J449 Chronic obstructive pulmonary disease, unspecified: Secondary | ICD-10-CM | POA: Diagnosis present

## 2014-11-11 DIAGNOSIS — F0390 Unspecified dementia without behavioral disturbance: Secondary | ICD-10-CM | POA: Diagnosis not present

## 2014-11-11 DIAGNOSIS — Z7982 Long term (current) use of aspirin: Secondary | ICD-10-CM | POA: Diagnosis not present

## 2014-11-11 DIAGNOSIS — Z8546 Personal history of malignant neoplasm of prostate: Secondary | ICD-10-CM | POA: Diagnosis not present

## 2014-11-11 DIAGNOSIS — K449 Diaphragmatic hernia without obstruction or gangrene: Secondary | ICD-10-CM | POA: Diagnosis present

## 2014-11-11 DIAGNOSIS — I10 Essential (primary) hypertension: Secondary | ICD-10-CM | POA: Diagnosis present

## 2014-11-11 DIAGNOSIS — D696 Thrombocytopenia, unspecified: Secondary | ICD-10-CM | POA: Diagnosis not present

## 2014-11-11 DIAGNOSIS — M1990 Unspecified osteoarthritis, unspecified site: Secondary | ICD-10-CM | POA: Diagnosis present

## 2014-11-11 DIAGNOSIS — Z85828 Personal history of other malignant neoplasm of skin: Secondary | ICD-10-CM | POA: Diagnosis not present

## 2014-11-11 DIAGNOSIS — I2510 Atherosclerotic heart disease of native coronary artery without angina pectoris: Secondary | ICD-10-CM | POA: Diagnosis not present

## 2014-11-11 DIAGNOSIS — K222 Esophageal obstruction: Secondary | ICD-10-CM | POA: Diagnosis not present

## 2014-11-11 DIAGNOSIS — I4891 Unspecified atrial fibrillation: Secondary | ICD-10-CM | POA: Diagnosis present

## 2014-11-11 DIAGNOSIS — J45909 Unspecified asthma, uncomplicated: Secondary | ICD-10-CM | POA: Diagnosis present

## 2014-11-11 DIAGNOSIS — R918 Other nonspecific abnormal finding of lung field: Secondary | ICD-10-CM | POA: Diagnosis not present

## 2014-11-11 DIAGNOSIS — D62 Acute posthemorrhagic anemia: Secondary | ICD-10-CM | POA: Diagnosis not present

## 2014-11-11 DIAGNOSIS — K250 Acute gastric ulcer with hemorrhage: Secondary | ICD-10-CM | POA: Diagnosis not present

## 2014-11-11 DIAGNOSIS — R079 Chest pain, unspecified: Secondary | ICD-10-CM | POA: Diagnosis not present

## 2014-11-11 DIAGNOSIS — I517 Cardiomegaly: Secondary | ICD-10-CM | POA: Diagnosis not present

## 2014-11-16 DIAGNOSIS — N401 Enlarged prostate with lower urinary tract symptoms: Secondary | ICD-10-CM | POA: Diagnosis not present

## 2014-11-16 DIAGNOSIS — N138 Other obstructive and reflux uropathy: Secondary | ICD-10-CM | POA: Diagnosis not present

## 2014-11-16 DIAGNOSIS — C61 Malignant neoplasm of prostate: Secondary | ICD-10-CM | POA: Diagnosis not present

## 2014-11-21 DIAGNOSIS — D649 Anemia, unspecified: Secondary | ICD-10-CM | POA: Diagnosis not present

## 2014-11-21 DIAGNOSIS — Z7901 Long term (current) use of anticoagulants: Secondary | ICD-10-CM | POA: Diagnosis not present

## 2014-11-21 DIAGNOSIS — I4891 Unspecified atrial fibrillation: Secondary | ICD-10-CM | POA: Diagnosis not present

## 2014-11-21 DIAGNOSIS — K922 Gastrointestinal hemorrhage, unspecified: Secondary | ICD-10-CM | POA: Diagnosis not present

## 2014-11-24 DIAGNOSIS — Z7901 Long term (current) use of anticoagulants: Secondary | ICD-10-CM | POA: Diagnosis not present

## 2014-11-24 DIAGNOSIS — N359 Urethral stricture, unspecified: Secondary | ICD-10-CM | POA: Diagnosis not present

## 2014-11-27 DIAGNOSIS — D473 Essential (hemorrhagic) thrombocythemia: Secondary | ICD-10-CM | POA: Diagnosis not present

## 2014-12-13 DIAGNOSIS — Z7901 Long term (current) use of anticoagulants: Secondary | ICD-10-CM | POA: Diagnosis not present

## 2014-12-15 DIAGNOSIS — C61 Malignant neoplasm of prostate: Secondary | ICD-10-CM | POA: Diagnosis not present

## 2014-12-18 DIAGNOSIS — K5790 Diverticulosis of intestine, part unspecified, without perforation or abscess without bleeding: Secondary | ICD-10-CM | POA: Diagnosis not present

## 2014-12-18 DIAGNOSIS — K222 Esophageal obstruction: Secondary | ICD-10-CM | POA: Diagnosis not present

## 2014-12-18 DIAGNOSIS — D509 Iron deficiency anemia, unspecified: Secondary | ICD-10-CM | POA: Diagnosis not present

## 2014-12-22 DIAGNOSIS — R5381 Other malaise: Secondary | ICD-10-CM | POA: Diagnosis not present

## 2014-12-22 DIAGNOSIS — R0600 Dyspnea, unspecified: Secondary | ICD-10-CM | POA: Diagnosis not present

## 2014-12-22 DIAGNOSIS — Z1389 Encounter for screening for other disorder: Secondary | ICD-10-CM | POA: Diagnosis not present

## 2014-12-22 DIAGNOSIS — Z9181 History of falling: Secondary | ICD-10-CM | POA: Diagnosis not present

## 2014-12-22 DIAGNOSIS — J302 Other seasonal allergic rhinitis: Secondary | ICD-10-CM | POA: Diagnosis not present

## 2014-12-22 DIAGNOSIS — R5383 Other fatigue: Secondary | ICD-10-CM | POA: Diagnosis not present

## 2014-12-22 DIAGNOSIS — R609 Edema, unspecified: Secondary | ICD-10-CM | POA: Diagnosis not present

## 2014-12-22 DIAGNOSIS — D519 Vitamin B12 deficiency anemia, unspecified: Secondary | ICD-10-CM | POA: Diagnosis not present

## 2014-12-25 DIAGNOSIS — D509 Iron deficiency anemia, unspecified: Secondary | ICD-10-CM | POA: Diagnosis not present

## 2014-12-25 DIAGNOSIS — M8580 Other specified disorders of bone density and structure, unspecified site: Secondary | ICD-10-CM | POA: Diagnosis not present

## 2014-12-25 DIAGNOSIS — K219 Gastro-esophageal reflux disease without esophagitis: Secondary | ICD-10-CM | POA: Diagnosis not present

## 2015-01-02 DIAGNOSIS — R0689 Other abnormalities of breathing: Secondary | ICD-10-CM | POA: Diagnosis not present

## 2015-01-02 DIAGNOSIS — I509 Heart failure, unspecified: Secondary | ICD-10-CM | POA: Diagnosis not present

## 2015-01-10 DIAGNOSIS — M4802 Spinal stenosis, cervical region: Secondary | ICD-10-CM | POA: Diagnosis not present

## 2015-01-10 DIAGNOSIS — M1990 Unspecified osteoarthritis, unspecified site: Secondary | ICD-10-CM | POA: Diagnosis not present

## 2015-01-10 DIAGNOSIS — M5030 Other cervical disc degeneration, unspecified cervical region: Secondary | ICD-10-CM | POA: Diagnosis not present

## 2015-01-11 DIAGNOSIS — I4891 Unspecified atrial fibrillation: Secondary | ICD-10-CM | POA: Diagnosis not present

## 2015-01-11 DIAGNOSIS — K922 Gastrointestinal hemorrhage, unspecified: Secondary | ICD-10-CM | POA: Diagnosis not present

## 2015-01-11 DIAGNOSIS — R0689 Other abnormalities of breathing: Secondary | ICD-10-CM | POA: Diagnosis not present

## 2015-01-11 DIAGNOSIS — I361 Nonrheumatic tricuspid (valve) insufficiency: Secondary | ICD-10-CM | POA: Diagnosis not present

## 2015-01-11 DIAGNOSIS — Z1389 Encounter for screening for other disorder: Secondary | ICD-10-CM | POA: Diagnosis not present

## 2015-01-11 DIAGNOSIS — Z79899 Other long term (current) drug therapy: Secondary | ICD-10-CM | POA: Diagnosis not present

## 2015-01-11 DIAGNOSIS — E780 Pure hypercholesterolemia: Secondary | ICD-10-CM | POA: Diagnosis not present

## 2015-01-11 DIAGNOSIS — M109 Gout, unspecified: Secondary | ICD-10-CM | POA: Diagnosis not present

## 2015-01-11 DIAGNOSIS — I509 Heart failure, unspecified: Secondary | ICD-10-CM | POA: Diagnosis not present

## 2015-01-11 DIAGNOSIS — I340 Nonrheumatic mitral (valve) insufficiency: Secondary | ICD-10-CM | POA: Diagnosis not present

## 2015-01-11 DIAGNOSIS — I471 Supraventricular tachycardia: Secondary | ICD-10-CM | POA: Diagnosis not present

## 2015-01-15 DIAGNOSIS — Z7901 Long term (current) use of anticoagulants: Secondary | ICD-10-CM | POA: Diagnosis not present

## 2015-01-16 DIAGNOSIS — I482 Chronic atrial fibrillation: Secondary | ICD-10-CM | POA: Diagnosis not present

## 2015-01-16 DIAGNOSIS — I5032 Chronic diastolic (congestive) heart failure: Secondary | ICD-10-CM | POA: Diagnosis not present

## 2015-01-16 DIAGNOSIS — I10 Essential (primary) hypertension: Secondary | ICD-10-CM | POA: Diagnosis not present

## 2015-01-17 DIAGNOSIS — Z7901 Long term (current) use of anticoagulants: Secondary | ICD-10-CM | POA: Diagnosis not present

## 2015-01-22 DIAGNOSIS — Z7901 Long term (current) use of anticoagulants: Secondary | ICD-10-CM | POA: Diagnosis not present

## 2015-01-22 DIAGNOSIS — D649 Anemia, unspecified: Secondary | ICD-10-CM | POA: Diagnosis not present

## 2015-01-25 DIAGNOSIS — J209 Acute bronchitis, unspecified: Secondary | ICD-10-CM | POA: Diagnosis not present

## 2015-01-25 DIAGNOSIS — H10023 Other mucopurulent conjunctivitis, bilateral: Secondary | ICD-10-CM | POA: Diagnosis not present

## 2015-01-25 DIAGNOSIS — L570 Actinic keratosis: Secondary | ICD-10-CM | POA: Diagnosis not present

## 2015-01-26 DIAGNOSIS — D509 Iron deficiency anemia, unspecified: Secondary | ICD-10-CM | POA: Diagnosis not present

## 2015-01-26 DIAGNOSIS — D473 Essential (hemorrhagic) thrombocythemia: Secondary | ICD-10-CM | POA: Diagnosis not present

## 2015-01-26 DIAGNOSIS — Z7901 Long term (current) use of anticoagulants: Secondary | ICD-10-CM | POA: Diagnosis not present

## 2015-01-30 DIAGNOSIS — I350 Nonrheumatic aortic (valve) stenosis: Secondary | ICD-10-CM | POA: Diagnosis not present

## 2015-01-30 DIAGNOSIS — I509 Heart failure, unspecified: Secondary | ICD-10-CM | POA: Diagnosis not present

## 2015-02-01 DIAGNOSIS — D473 Essential (hemorrhagic) thrombocythemia: Secondary | ICD-10-CM | POA: Diagnosis not present

## 2015-02-01 DIAGNOSIS — D509 Iron deficiency anemia, unspecified: Secondary | ICD-10-CM | POA: Diagnosis not present

## 2015-02-05 DIAGNOSIS — Z7901 Long term (current) use of anticoagulants: Secondary | ICD-10-CM | POA: Diagnosis not present

## 2015-02-05 DIAGNOSIS — I4891 Unspecified atrial fibrillation: Secondary | ICD-10-CM | POA: Diagnosis not present

## 2015-02-07 DIAGNOSIS — Z7901 Long term (current) use of anticoagulants: Secondary | ICD-10-CM | POA: Diagnosis not present

## 2015-02-07 DIAGNOSIS — I4891 Unspecified atrial fibrillation: Secondary | ICD-10-CM | POA: Diagnosis not present

## 2015-02-08 DIAGNOSIS — D473 Essential (hemorrhagic) thrombocythemia: Secondary | ICD-10-CM | POA: Diagnosis not present

## 2015-02-08 DIAGNOSIS — D509 Iron deficiency anemia, unspecified: Secondary | ICD-10-CM | POA: Diagnosis not present

## 2015-02-14 DIAGNOSIS — H02834 Dermatochalasis of left upper eyelid: Secondary | ICD-10-CM | POA: Diagnosis not present

## 2015-02-14 DIAGNOSIS — R0602 Shortness of breath: Secondary | ICD-10-CM | POA: Diagnosis not present

## 2015-02-14 DIAGNOSIS — Z7901 Long term (current) use of anticoagulants: Secondary | ICD-10-CM | POA: Diagnosis not present

## 2015-02-14 DIAGNOSIS — I110 Hypertensive heart disease with heart failure: Secondary | ICD-10-CM | POA: Diagnosis not present

## 2015-02-14 DIAGNOSIS — H35343 Macular cyst, hole, or pseudohole, bilateral: Secondary | ICD-10-CM | POA: Diagnosis not present

## 2015-02-14 DIAGNOSIS — H3531 Nonexudative age-related macular degeneration: Secondary | ICD-10-CM | POA: Diagnosis not present

## 2015-02-14 DIAGNOSIS — H02831 Dermatochalasis of right upper eyelid: Secondary | ICD-10-CM | POA: Diagnosis not present

## 2015-02-14 DIAGNOSIS — I4891 Unspecified atrial fibrillation: Secondary | ICD-10-CM | POA: Diagnosis not present

## 2015-02-14 DIAGNOSIS — Z7251 High risk heterosexual behavior: Secondary | ICD-10-CM | POA: Diagnosis not present

## 2015-02-20 DIAGNOSIS — I4891 Unspecified atrial fibrillation: Secondary | ICD-10-CM | POA: Diagnosis not present

## 2015-02-20 DIAGNOSIS — J449 Chronic obstructive pulmonary disease, unspecified: Secondary | ICD-10-CM | POA: Diagnosis not present

## 2015-02-20 DIAGNOSIS — I10 Essential (primary) hypertension: Secondary | ICD-10-CM | POA: Diagnosis not present

## 2015-02-26 DIAGNOSIS — I10 Essential (primary) hypertension: Secondary | ICD-10-CM | POA: Diagnosis not present

## 2015-02-26 DIAGNOSIS — J449 Chronic obstructive pulmonary disease, unspecified: Secondary | ICD-10-CM | POA: Diagnosis not present

## 2015-02-26 DIAGNOSIS — I481 Persistent atrial fibrillation: Secondary | ICD-10-CM | POA: Diagnosis not present

## 2015-02-26 DIAGNOSIS — I5032 Chronic diastolic (congestive) heart failure: Secondary | ICD-10-CM | POA: Diagnosis not present

## 2015-02-26 DIAGNOSIS — I4891 Unspecified atrial fibrillation: Secondary | ICD-10-CM | POA: Diagnosis not present

## 2015-02-26 DIAGNOSIS — D568 Other thalassemias: Secondary | ICD-10-CM | POA: Diagnosis not present

## 2015-02-26 DIAGNOSIS — R0602 Shortness of breath: Secondary | ICD-10-CM | POA: Diagnosis not present

## 2015-02-26 DIAGNOSIS — E784 Other hyperlipidemia: Secondary | ICD-10-CM | POA: Diagnosis not present

## 2015-02-26 DIAGNOSIS — H35343 Macular cyst, hole, or pseudohole, bilateral: Secondary | ICD-10-CM | POA: Diagnosis not present

## 2015-02-28 DIAGNOSIS — I4891 Unspecified atrial fibrillation: Secondary | ICD-10-CM | POA: Diagnosis not present

## 2015-02-28 DIAGNOSIS — I4892 Unspecified atrial flutter: Secondary | ICD-10-CM | POA: Diagnosis not present

## 2015-03-01 DIAGNOSIS — I4891 Unspecified atrial fibrillation: Secondary | ICD-10-CM | POA: Diagnosis not present

## 2015-03-08 DIAGNOSIS — D473 Essential (hemorrhagic) thrombocythemia: Secondary | ICD-10-CM | POA: Diagnosis not present

## 2015-03-13 DIAGNOSIS — L570 Actinic keratosis: Secondary | ICD-10-CM | POA: Diagnosis not present

## 2015-03-15 DIAGNOSIS — H35343 Macular cyst, hole, or pseudohole, bilateral: Secondary | ICD-10-CM | POA: Diagnosis not present

## 2015-03-15 DIAGNOSIS — H02834 Dermatochalasis of left upper eyelid: Secondary | ICD-10-CM | POA: Diagnosis not present

## 2015-03-15 DIAGNOSIS — H02831 Dermatochalasis of right upper eyelid: Secondary | ICD-10-CM | POA: Diagnosis not present

## 2015-03-15 DIAGNOSIS — H3531 Nonexudative age-related macular degeneration: Secondary | ICD-10-CM | POA: Diagnosis not present

## 2015-03-19 DIAGNOSIS — I481 Persistent atrial fibrillation: Secondary | ICD-10-CM | POA: Diagnosis not present

## 2015-03-19 DIAGNOSIS — Z7901 Long term (current) use of anticoagulants: Secondary | ICD-10-CM | POA: Diagnosis not present

## 2015-03-22 DIAGNOSIS — I481 Persistent atrial fibrillation: Secondary | ICD-10-CM | POA: Diagnosis not present

## 2015-03-22 DIAGNOSIS — I2510 Atherosclerotic heart disease of native coronary artery without angina pectoris: Secondary | ICD-10-CM | POA: Diagnosis not present

## 2015-03-22 DIAGNOSIS — I495 Sick sinus syndrome: Secondary | ICD-10-CM | POA: Diagnosis not present

## 2015-03-23 DIAGNOSIS — C61 Malignant neoplasm of prostate: Secondary | ICD-10-CM | POA: Diagnosis not present

## 2015-04-02 DIAGNOSIS — I4891 Unspecified atrial fibrillation: Secondary | ICD-10-CM | POA: Diagnosis not present

## 2015-04-02 DIAGNOSIS — Z7901 Long term (current) use of anticoagulants: Secondary | ICD-10-CM | POA: Diagnosis not present

## 2015-04-11 DIAGNOSIS — H35373 Puckering of macula, bilateral: Secondary | ICD-10-CM | POA: Diagnosis not present

## 2015-04-11 DIAGNOSIS — Z961 Presence of intraocular lens: Secondary | ICD-10-CM | POA: Diagnosis not present

## 2015-04-11 DIAGNOSIS — H35343 Macular cyst, hole, or pseudohole, bilateral: Secondary | ICD-10-CM | POA: Diagnosis not present

## 2015-04-11 DIAGNOSIS — H35353 Cystoid macular degeneration, bilateral: Secondary | ICD-10-CM | POA: Diagnosis not present

## 2015-04-13 DIAGNOSIS — R000 Tachycardia, unspecified: Secondary | ICD-10-CM | POA: Diagnosis not present

## 2015-04-13 DIAGNOSIS — Z7901 Long term (current) use of anticoagulants: Secondary | ICD-10-CM | POA: Diagnosis not present

## 2015-04-13 DIAGNOSIS — I4891 Unspecified atrial fibrillation: Secondary | ICD-10-CM | POA: Diagnosis not present

## 2015-04-13 DIAGNOSIS — I481 Persistent atrial fibrillation: Secondary | ICD-10-CM | POA: Diagnosis not present

## 2015-04-16 DIAGNOSIS — I350 Nonrheumatic aortic (valve) stenosis: Secondary | ICD-10-CM | POA: Diagnosis not present

## 2015-04-16 DIAGNOSIS — T82110A Breakdown (mechanical) of cardiac electrode, initial encounter: Secondary | ICD-10-CM | POA: Diagnosis not present

## 2015-04-16 DIAGNOSIS — Z950 Presence of cardiac pacemaker: Secondary | ICD-10-CM | POA: Diagnosis not present

## 2015-04-16 DIAGNOSIS — R000 Tachycardia, unspecified: Secondary | ICD-10-CM | POA: Diagnosis not present

## 2015-04-16 DIAGNOSIS — I10 Essential (primary) hypertension: Secondary | ICD-10-CM | POA: Diagnosis not present

## 2015-04-16 DIAGNOSIS — K219 Gastro-esophageal reflux disease without esophagitis: Secondary | ICD-10-CM | POA: Diagnosis not present

## 2015-04-16 DIAGNOSIS — J449 Chronic obstructive pulmonary disease, unspecified: Secondary | ICD-10-CM | POA: Diagnosis not present

## 2015-04-16 DIAGNOSIS — I481 Persistent atrial fibrillation: Secondary | ICD-10-CM | POA: Diagnosis not present

## 2015-04-16 DIAGNOSIS — I2510 Atherosclerotic heart disease of native coronary artery without angina pectoris: Secondary | ICD-10-CM | POA: Diagnosis not present

## 2015-04-16 DIAGNOSIS — J984 Other disorders of lung: Secondary | ICD-10-CM | POA: Diagnosis not present

## 2015-04-16 DIAGNOSIS — R0602 Shortness of breath: Secondary | ICD-10-CM | POA: Diagnosis not present

## 2015-04-16 DIAGNOSIS — I495 Sick sinus syndrome: Secondary | ICD-10-CM | POA: Diagnosis not present

## 2015-04-16 DIAGNOSIS — I517 Cardiomegaly: Secondary | ICD-10-CM | POA: Diagnosis not present

## 2015-04-25 DIAGNOSIS — Z45010 Encounter for checking and testing of cardiac pacemaker pulse generator [battery]: Secondary | ICD-10-CM | POA: Diagnosis not present

## 2015-04-25 DIAGNOSIS — Z950 Presence of cardiac pacemaker: Secondary | ICD-10-CM | POA: Diagnosis not present

## 2015-04-25 DIAGNOSIS — I4891 Unspecified atrial fibrillation: Secondary | ICD-10-CM | POA: Diagnosis not present

## 2015-04-25 DIAGNOSIS — R0602 Shortness of breath: Secondary | ICD-10-CM | POA: Diagnosis not present

## 2015-04-26 DIAGNOSIS — R339 Retention of urine, unspecified: Secondary | ICD-10-CM | POA: Diagnosis not present

## 2015-04-26 DIAGNOSIS — C61 Malignant neoplasm of prostate: Secondary | ICD-10-CM | POA: Diagnosis not present

## 2015-04-27 DIAGNOSIS — Z7901 Long term (current) use of anticoagulants: Secondary | ICD-10-CM | POA: Diagnosis not present

## 2015-04-30 DIAGNOSIS — N359 Urethral stricture, unspecified: Secondary | ICD-10-CM | POA: Diagnosis not present

## 2015-05-01 DIAGNOSIS — C61 Malignant neoplasm of prostate: Secondary | ICD-10-CM | POA: Diagnosis not present

## 2015-05-01 DIAGNOSIS — R339 Retention of urine, unspecified: Secondary | ICD-10-CM | POA: Diagnosis not present

## 2015-05-02 DIAGNOSIS — H35353 Cystoid macular degeneration, bilateral: Secondary | ICD-10-CM | POA: Diagnosis not present

## 2015-05-02 DIAGNOSIS — H35373 Puckering of macula, bilateral: Secondary | ICD-10-CM | POA: Diagnosis not present

## 2015-05-02 DIAGNOSIS — Z961 Presence of intraocular lens: Secondary | ICD-10-CM | POA: Diagnosis not present

## 2015-05-02 DIAGNOSIS — H35343 Macular cyst, hole, or pseudohole, bilateral: Secondary | ICD-10-CM | POA: Diagnosis not present

## 2015-05-07 DIAGNOSIS — I517 Cardiomegaly: Secondary | ICD-10-CM | POA: Diagnosis not present

## 2015-05-07 DIAGNOSIS — M79652 Pain in left thigh: Secondary | ICD-10-CM | POA: Diagnosis not present

## 2015-05-07 DIAGNOSIS — M79662 Pain in left lower leg: Secondary | ICD-10-CM | POA: Diagnosis not present

## 2015-05-07 DIAGNOSIS — M79605 Pain in left leg: Secondary | ICD-10-CM | POA: Diagnosis not present

## 2015-05-07 DIAGNOSIS — M7989 Other specified soft tissue disorders: Secondary | ICD-10-CM | POA: Diagnosis not present

## 2015-05-08 DIAGNOSIS — R0902 Hypoxemia: Secondary | ICD-10-CM | POA: Diagnosis not present

## 2015-05-08 DIAGNOSIS — N289 Disorder of kidney and ureter, unspecified: Secondary | ICD-10-CM | POA: Diagnosis present

## 2015-05-08 DIAGNOSIS — Z950 Presence of cardiac pacemaker: Secondary | ICD-10-CM | POA: Diagnosis not present

## 2015-05-08 DIAGNOSIS — M60062 Infective myositis, left lower leg: Secondary | ICD-10-CM | POA: Diagnosis not present

## 2015-05-08 DIAGNOSIS — I519 Heart disease, unspecified: Secondary | ICD-10-CM | POA: Diagnosis present

## 2015-05-08 DIAGNOSIS — N3000 Acute cystitis without hematuria: Secondary | ICD-10-CM | POA: Diagnosis not present

## 2015-05-08 DIAGNOSIS — M109 Gout, unspecified: Secondary | ICD-10-CM | POA: Diagnosis present

## 2015-05-08 DIAGNOSIS — M60004 Infective myositis, unspecified left leg: Secondary | ICD-10-CM | POA: Diagnosis not present

## 2015-05-08 DIAGNOSIS — M79605 Pain in left leg: Secondary | ICD-10-CM | POA: Diagnosis not present

## 2015-05-08 DIAGNOSIS — I2510 Atherosclerotic heart disease of native coronary artery without angina pectoris: Secondary | ICD-10-CM | POA: Diagnosis not present

## 2015-05-08 DIAGNOSIS — R338 Other retention of urine: Secondary | ICD-10-CM | POA: Diagnosis present

## 2015-05-08 DIAGNOSIS — R05 Cough: Secondary | ICD-10-CM | POA: Diagnosis not present

## 2015-05-08 DIAGNOSIS — N3001 Acute cystitis with hematuria: Secondary | ICD-10-CM | POA: Diagnosis not present

## 2015-05-08 DIAGNOSIS — I25119 Atherosclerotic heart disease of native coronary artery with unspecified angina pectoris: Secondary | ICD-10-CM | POA: Diagnosis present

## 2015-05-08 DIAGNOSIS — D72829 Elevated white blood cell count, unspecified: Secondary | ICD-10-CM | POA: Diagnosis not present

## 2015-05-08 DIAGNOSIS — M79662 Pain in left lower leg: Secondary | ICD-10-CM | POA: Diagnosis not present

## 2015-05-08 DIAGNOSIS — I517 Cardiomegaly: Secondary | ICD-10-CM | POA: Diagnosis not present

## 2015-05-08 DIAGNOSIS — D473 Essential (hemorrhagic) thrombocythemia: Secondary | ICD-10-CM | POA: Diagnosis not present

## 2015-05-08 DIAGNOSIS — Z7901 Long term (current) use of anticoagulants: Secondary | ICD-10-CM | POA: Diagnosis not present

## 2015-05-08 DIAGNOSIS — Z79899 Other long term (current) drug therapy: Secondary | ICD-10-CM | POA: Diagnosis not present

## 2015-05-08 DIAGNOSIS — J4530 Mild persistent asthma, uncomplicated: Secondary | ICD-10-CM | POA: Diagnosis present

## 2015-05-08 DIAGNOSIS — M60862 Other myositis, left lower leg: Secondary | ICD-10-CM | POA: Diagnosis not present

## 2015-05-08 DIAGNOSIS — I10 Essential (primary) hypertension: Secondary | ICD-10-CM | POA: Diagnosis not present

## 2015-05-08 DIAGNOSIS — C61 Malignant neoplasm of prostate: Secondary | ICD-10-CM | POA: Diagnosis not present

## 2015-05-08 DIAGNOSIS — M7989 Other specified soft tissue disorders: Secondary | ICD-10-CM | POA: Diagnosis not present

## 2015-05-08 DIAGNOSIS — R5383 Other fatigue: Secondary | ICD-10-CM | POA: Diagnosis not present

## 2015-05-08 DIAGNOSIS — I350 Nonrheumatic aortic (valve) stenosis: Secondary | ICD-10-CM | POA: Diagnosis not present

## 2015-05-08 DIAGNOSIS — I482 Chronic atrial fibrillation: Secondary | ICD-10-CM | POA: Diagnosis present

## 2015-05-08 DIAGNOSIS — M79652 Pain in left thigh: Secondary | ICD-10-CM | POA: Diagnosis not present

## 2015-05-08 DIAGNOSIS — M1990 Unspecified osteoarthritis, unspecified site: Secondary | ICD-10-CM | POA: Diagnosis present

## 2015-05-08 DIAGNOSIS — E780 Pure hypercholesterolemia: Secondary | ICD-10-CM | POA: Diagnosis present

## 2015-05-08 DIAGNOSIS — F0390 Unspecified dementia without behavioral disturbance: Secondary | ICD-10-CM | POA: Diagnosis not present

## 2015-05-15 DIAGNOSIS — R339 Retention of urine, unspecified: Secondary | ICD-10-CM | POA: Diagnosis not present

## 2015-05-15 DIAGNOSIS — I495 Sick sinus syndrome: Secondary | ICD-10-CM | POA: Diagnosis not present

## 2015-05-15 DIAGNOSIS — I2510 Atherosclerotic heart disease of native coronary artery without angina pectoris: Secondary | ICD-10-CM | POA: Diagnosis not present

## 2015-05-15 DIAGNOSIS — I481 Persistent atrial fibrillation: Secondary | ICD-10-CM | POA: Diagnosis not present

## 2015-05-15 DIAGNOSIS — Z950 Presence of cardiac pacemaker: Secondary | ICD-10-CM | POA: Diagnosis not present

## 2015-05-22 DIAGNOSIS — N359 Urethral stricture, unspecified: Secondary | ICD-10-CM | POA: Diagnosis not present

## 2015-05-22 DIAGNOSIS — M1712 Unilateral primary osteoarthritis, left knee: Secondary | ICD-10-CM | POA: Diagnosis not present

## 2015-05-25 DIAGNOSIS — R339 Retention of urine, unspecified: Secondary | ICD-10-CM | POA: Diagnosis not present

## 2015-05-28 DIAGNOSIS — Z7901 Long term (current) use of anticoagulants: Secondary | ICD-10-CM | POA: Diagnosis not present

## 2015-05-29 DIAGNOSIS — J45909 Unspecified asthma, uncomplicated: Secondary | ICD-10-CM | POA: Diagnosis not present

## 2015-05-29 DIAGNOSIS — N289 Disorder of kidney and ureter, unspecified: Secondary | ICD-10-CM | POA: Diagnosis not present

## 2015-05-29 DIAGNOSIS — Z1389 Encounter for screening for other disorder: Secondary | ICD-10-CM | POA: Diagnosis not present

## 2015-05-29 DIAGNOSIS — D473 Essential (hemorrhagic) thrombocythemia: Secondary | ICD-10-CM | POA: Diagnosis not present

## 2015-05-29 DIAGNOSIS — I499 Cardiac arrhythmia, unspecified: Secondary | ICD-10-CM | POA: Diagnosis not present

## 2015-05-29 DIAGNOSIS — N390 Urinary tract infection, site not specified: Secondary | ICD-10-CM | POA: Diagnosis not present

## 2015-06-01 DIAGNOSIS — N289 Disorder of kidney and ureter, unspecified: Secondary | ICD-10-CM | POA: Diagnosis not present

## 2015-06-01 DIAGNOSIS — Z7901 Long term (current) use of anticoagulants: Secondary | ICD-10-CM | POA: Diagnosis not present

## 2015-06-04 DIAGNOSIS — Z79899 Other long term (current) drug therapy: Secondary | ICD-10-CM | POA: Diagnosis not present

## 2015-06-04 DIAGNOSIS — J4530 Mild persistent asthma, uncomplicated: Secondary | ICD-10-CM | POA: Diagnosis present

## 2015-06-04 DIAGNOSIS — I482 Chronic atrial fibrillation: Secondary | ICD-10-CM | POA: Diagnosis present

## 2015-06-04 DIAGNOSIS — J90 Pleural effusion, not elsewhere classified: Secondary | ICD-10-CM | POA: Diagnosis not present

## 2015-06-04 DIAGNOSIS — I519 Heart disease, unspecified: Secondary | ICD-10-CM | POA: Diagnosis present

## 2015-06-04 DIAGNOSIS — J9690 Respiratory failure, unspecified, unspecified whether with hypoxia or hypercapnia: Secondary | ICD-10-CM | POA: Diagnosis not present

## 2015-06-04 DIAGNOSIS — R29898 Other symptoms and signs involving the musculoskeletal system: Secondary | ICD-10-CM | POA: Diagnosis not present

## 2015-06-04 DIAGNOSIS — B9620 Unspecified Escherichia coli [E. coli] as the cause of diseases classified elsewhere: Secondary | ICD-10-CM | POA: Diagnosis present

## 2015-06-04 DIAGNOSIS — R531 Weakness: Secondary | ICD-10-CM | POA: Diagnosis not present

## 2015-06-04 DIAGNOSIS — R000 Tachycardia, unspecified: Secondary | ICD-10-CM | POA: Diagnosis present

## 2015-06-04 DIAGNOSIS — I083 Combined rheumatic disorders of mitral, aortic and tricuspid valves: Secondary | ICD-10-CM | POA: Diagnosis not present

## 2015-06-04 DIAGNOSIS — J811 Chronic pulmonary edema: Secondary | ICD-10-CM | POA: Diagnosis not present

## 2015-06-04 DIAGNOSIS — I639 Cerebral infarction, unspecified: Secondary | ICD-10-CM | POA: Diagnosis not present

## 2015-06-04 DIAGNOSIS — K219 Gastro-esophageal reflux disease without esophagitis: Secondary | ICD-10-CM | POA: Diagnosis not present

## 2015-06-04 DIAGNOSIS — J449 Chronic obstructive pulmonary disease, unspecified: Secondary | ICD-10-CM | POA: Diagnosis not present

## 2015-06-04 DIAGNOSIS — Z8546 Personal history of malignant neoplasm of prostate: Secondary | ICD-10-CM | POA: Diagnosis not present

## 2015-06-04 DIAGNOSIS — M6281 Muscle weakness (generalized): Secondary | ICD-10-CM | POA: Diagnosis not present

## 2015-06-04 DIAGNOSIS — N3000 Acute cystitis without hematuria: Secondary | ICD-10-CM | POA: Diagnosis not present

## 2015-06-04 DIAGNOSIS — E784 Other hyperlipidemia: Secondary | ICD-10-CM | POA: Diagnosis not present

## 2015-06-04 DIAGNOSIS — F0390 Unspecified dementia without behavioral disturbance: Secondary | ICD-10-CM | POA: Diagnosis present

## 2015-06-04 DIAGNOSIS — I4891 Unspecified atrial fibrillation: Secondary | ICD-10-CM | POA: Diagnosis not present

## 2015-06-04 DIAGNOSIS — R262 Difficulty in walking, not elsewhere classified: Secondary | ICD-10-CM | POA: Diagnosis present

## 2015-06-04 DIAGNOSIS — E780 Pure hypercholesterolemia: Secondary | ICD-10-CM | POA: Diagnosis present

## 2015-06-04 DIAGNOSIS — I2510 Atherosclerotic heart disease of native coronary artery without angina pectoris: Secondary | ICD-10-CM | POA: Diagnosis not present

## 2015-06-04 DIAGNOSIS — G8194 Hemiplegia, unspecified affecting left nondominant side: Secondary | ICD-10-CM | POA: Diagnosis not present

## 2015-06-04 DIAGNOSIS — I517 Cardiomegaly: Secondary | ICD-10-CM | POA: Diagnosis not present

## 2015-06-04 DIAGNOSIS — E860 Dehydration: Secondary | ICD-10-CM | POA: Diagnosis not present

## 2015-06-04 DIAGNOSIS — R339 Retention of urine, unspecified: Secondary | ICD-10-CM | POA: Diagnosis not present

## 2015-06-04 DIAGNOSIS — Z950 Presence of cardiac pacemaker: Secondary | ICD-10-CM | POA: Diagnosis not present

## 2015-06-04 DIAGNOSIS — I5020 Unspecified systolic (congestive) heart failure: Secondary | ICD-10-CM | POA: Diagnosis not present

## 2015-06-04 DIAGNOSIS — N390 Urinary tract infection, site not specified: Secondary | ICD-10-CM | POA: Diagnosis present

## 2015-06-04 DIAGNOSIS — R319 Hematuria, unspecified: Secondary | ICD-10-CM | POA: Diagnosis present

## 2015-06-04 DIAGNOSIS — E785 Hyperlipidemia, unspecified: Secondary | ICD-10-CM | POA: Diagnosis not present

## 2015-06-04 DIAGNOSIS — N400 Enlarged prostate without lower urinary tract symptoms: Secondary | ICD-10-CM | POA: Diagnosis present

## 2015-06-04 DIAGNOSIS — I5022 Chronic systolic (congestive) heart failure: Secondary | ICD-10-CM | POA: Diagnosis present

## 2015-06-04 DIAGNOSIS — Z7901 Long term (current) use of anticoagulants: Secondary | ICD-10-CM | POA: Diagnosis not present

## 2015-06-04 DIAGNOSIS — N179 Acute kidney failure, unspecified: Secondary | ICD-10-CM | POA: Diagnosis not present

## 2015-06-04 DIAGNOSIS — R29810 Facial weakness: Secondary | ICD-10-CM | POA: Diagnosis not present

## 2015-06-04 DIAGNOSIS — I472 Ventricular tachycardia: Secondary | ICD-10-CM | POA: Diagnosis not present

## 2015-06-04 DIAGNOSIS — I350 Nonrheumatic aortic (valve) stenosis: Secondary | ICD-10-CM | POA: Diagnosis present

## 2015-06-12 DIAGNOSIS — I472 Ventricular tachycardia: Secondary | ICD-10-CM | POA: Diagnosis not present

## 2015-06-12 DIAGNOSIS — Z9981 Dependence on supplemental oxygen: Secondary | ICD-10-CM | POA: Diagnosis not present

## 2015-06-12 DIAGNOSIS — I69354 Hemiplegia and hemiparesis following cerebral infarction affecting left non-dominant side: Secondary | ICD-10-CM | POA: Diagnosis not present

## 2015-06-12 DIAGNOSIS — I4891 Unspecified atrial fibrillation: Secondary | ICD-10-CM | POA: Diagnosis not present

## 2015-06-12 DIAGNOSIS — B9620 Unspecified Escherichia coli [E. coli] as the cause of diseases classified elsewhere: Secondary | ICD-10-CM | POA: Diagnosis not present

## 2015-06-12 DIAGNOSIS — Z7901 Long term (current) use of anticoagulants: Secondary | ICD-10-CM | POA: Diagnosis not present

## 2015-06-12 DIAGNOSIS — I5020 Unspecified systolic (congestive) heart failure: Secondary | ICD-10-CM | POA: Diagnosis not present

## 2015-06-12 DIAGNOSIS — N390 Urinary tract infection, site not specified: Secondary | ICD-10-CM | POA: Diagnosis not present

## 2015-06-14 DIAGNOSIS — D473 Essential (hemorrhagic) thrombocythemia: Secondary | ICD-10-CM | POA: Diagnosis not present

## 2015-06-15 DIAGNOSIS — I472 Ventricular tachycardia: Secondary | ICD-10-CM | POA: Diagnosis not present

## 2015-06-15 DIAGNOSIS — I69354 Hemiplegia and hemiparesis following cerebral infarction affecting left non-dominant side: Secondary | ICD-10-CM | POA: Diagnosis not present

## 2015-06-15 DIAGNOSIS — I4891 Unspecified atrial fibrillation: Secondary | ICD-10-CM | POA: Diagnosis not present

## 2015-06-15 DIAGNOSIS — B9620 Unspecified Escherichia coli [E. coli] as the cause of diseases classified elsewhere: Secondary | ICD-10-CM | POA: Diagnosis not present

## 2015-06-15 DIAGNOSIS — N390 Urinary tract infection, site not specified: Secondary | ICD-10-CM | POA: Diagnosis not present

## 2015-06-15 DIAGNOSIS — I5020 Unspecified systolic (congestive) heart failure: Secondary | ICD-10-CM | POA: Diagnosis not present

## 2015-06-20 DIAGNOSIS — I639 Cerebral infarction, unspecified: Secondary | ICD-10-CM | POA: Diagnosis not present

## 2015-06-20 DIAGNOSIS — N390 Urinary tract infection, site not specified: Secondary | ICD-10-CM | POA: Diagnosis not present

## 2015-06-20 DIAGNOSIS — Z1389 Encounter for screening for other disorder: Secondary | ICD-10-CM | POA: Diagnosis not present

## 2015-06-20 DIAGNOSIS — Z23 Encounter for immunization: Secondary | ICD-10-CM | POA: Diagnosis not present

## 2015-06-20 DIAGNOSIS — Z7901 Long term (current) use of anticoagulants: Secondary | ICD-10-CM | POA: Diagnosis not present

## 2015-06-27 DIAGNOSIS — N289 Disorder of kidney and ureter, unspecified: Secondary | ICD-10-CM | POA: Diagnosis not present

## 2015-06-27 DIAGNOSIS — Z7901 Long term (current) use of anticoagulants: Secondary | ICD-10-CM | POA: Diagnosis not present

## 2015-07-02 DIAGNOSIS — E876 Hypokalemia: Secondary | ICD-10-CM | POA: Diagnosis not present

## 2015-07-02 DIAGNOSIS — Z7901 Long term (current) use of anticoagulants: Secondary | ICD-10-CM | POA: Diagnosis not present

## 2015-07-04 DIAGNOSIS — H35373 Puckering of macula, bilateral: Secondary | ICD-10-CM | POA: Diagnosis not present

## 2015-07-04 DIAGNOSIS — Z961 Presence of intraocular lens: Secondary | ICD-10-CM | POA: Diagnosis not present

## 2015-07-04 DIAGNOSIS — H35353 Cystoid macular degeneration, bilateral: Secondary | ICD-10-CM | POA: Diagnosis not present

## 2015-07-09 DIAGNOSIS — Z7901 Long term (current) use of anticoagulants: Secondary | ICD-10-CM | POA: Diagnosis not present

## 2015-07-11 DIAGNOSIS — M4802 Spinal stenosis, cervical region: Secondary | ICD-10-CM | POA: Diagnosis not present

## 2015-07-11 DIAGNOSIS — M5030 Other cervical disc degeneration, unspecified cervical region: Secondary | ICD-10-CM | POA: Diagnosis not present

## 2015-07-11 DIAGNOSIS — M1990 Unspecified osteoarthritis, unspecified site: Secondary | ICD-10-CM | POA: Diagnosis not present

## 2015-07-13 DIAGNOSIS — Z7901 Long term (current) use of anticoagulants: Secondary | ICD-10-CM | POA: Diagnosis not present

## 2015-07-17 DIAGNOSIS — R339 Retention of urine, unspecified: Secondary | ICD-10-CM | POA: Diagnosis not present

## 2015-07-17 DIAGNOSIS — N359 Urethral stricture, unspecified: Secondary | ICD-10-CM | POA: Diagnosis not present

## 2015-07-19 DIAGNOSIS — I5020 Unspecified systolic (congestive) heart failure: Secondary | ICD-10-CM | POA: Diagnosis not present

## 2015-07-19 DIAGNOSIS — Z7901 Long term (current) use of anticoagulants: Secondary | ICD-10-CM | POA: Diagnosis not present

## 2015-07-19 DIAGNOSIS — N390 Urinary tract infection, site not specified: Secondary | ICD-10-CM | POA: Diagnosis not present

## 2015-07-25 DIAGNOSIS — D473 Essential (hemorrhagic) thrombocythemia: Secondary | ICD-10-CM | POA: Diagnosis not present

## 2015-07-26 DIAGNOSIS — I2510 Atherosclerotic heart disease of native coronary artery without angina pectoris: Secondary | ICD-10-CM | POA: Diagnosis not present

## 2015-07-26 DIAGNOSIS — Z7901 Long term (current) use of anticoagulants: Secondary | ICD-10-CM | POA: Diagnosis not present

## 2015-07-26 DIAGNOSIS — J439 Emphysema, unspecified: Secondary | ICD-10-CM | POA: Diagnosis not present

## 2015-07-26 DIAGNOSIS — M109 Gout, unspecified: Secondary | ICD-10-CM | POA: Diagnosis not present

## 2015-07-26 DIAGNOSIS — I480 Paroxysmal atrial fibrillation: Secondary | ICD-10-CM | POA: Diagnosis not present

## 2015-07-26 DIAGNOSIS — I481 Persistent atrial fibrillation: Secondary | ICD-10-CM | POA: Diagnosis not present

## 2015-07-26 DIAGNOSIS — G2581 Restless legs syndrome: Secondary | ICD-10-CM | POA: Diagnosis not present

## 2015-07-26 DIAGNOSIS — Z950 Presence of cardiac pacemaker: Secondary | ICD-10-CM | POA: Diagnosis not present

## 2015-07-26 DIAGNOSIS — Z79899 Other long term (current) drug therapy: Secondary | ICD-10-CM | POA: Diagnosis not present

## 2015-07-26 DIAGNOSIS — I440 Atrioventricular block, first degree: Secondary | ICD-10-CM | POA: Diagnosis not present

## 2015-07-26 DIAGNOSIS — I110 Hypertensive heart disease with heart failure: Secondary | ICD-10-CM | POA: Diagnosis not present

## 2015-07-26 DIAGNOSIS — I4510 Unspecified right bundle-branch block: Secondary | ICD-10-CM | POA: Diagnosis not present

## 2015-07-26 DIAGNOSIS — J45909 Unspecified asthma, uncomplicated: Secondary | ICD-10-CM | POA: Diagnosis not present

## 2015-07-26 DIAGNOSIS — M1990 Unspecified osteoarthritis, unspecified site: Secondary | ICD-10-CM | POA: Diagnosis not present

## 2015-07-26 DIAGNOSIS — I509 Heart failure, unspecified: Secondary | ICD-10-CM | POA: Diagnosis not present

## 2015-07-26 DIAGNOSIS — Z8673 Personal history of transient ischemic attack (TIA), and cerebral infarction without residual deficits: Secondary | ICD-10-CM | POA: Diagnosis not present

## 2015-08-15 DIAGNOSIS — Z7901 Long term (current) use of anticoagulants: Secondary | ICD-10-CM | POA: Diagnosis not present

## 2015-08-16 DIAGNOSIS — H35373 Puckering of macula, bilateral: Secondary | ICD-10-CM | POA: Diagnosis not present

## 2015-08-16 DIAGNOSIS — H35353 Cystoid macular degeneration, bilateral: Secondary | ICD-10-CM | POA: Diagnosis not present

## 2015-08-16 DIAGNOSIS — Z961 Presence of intraocular lens: Secondary | ICD-10-CM | POA: Diagnosis not present

## 2015-08-16 DIAGNOSIS — H35343 Macular cyst, hole, or pseudohole, bilateral: Secondary | ICD-10-CM | POA: Diagnosis not present

## 2015-08-22 DIAGNOSIS — Z7901 Long term (current) use of anticoagulants: Secondary | ICD-10-CM | POA: Diagnosis not present

## 2015-08-23 DIAGNOSIS — I481 Persistent atrial fibrillation: Secondary | ICD-10-CM | POA: Diagnosis not present

## 2015-08-23 DIAGNOSIS — Z950 Presence of cardiac pacemaker: Secondary | ICD-10-CM | POA: Diagnosis not present

## 2015-08-23 DIAGNOSIS — I2510 Atherosclerotic heart disease of native coronary artery without angina pectoris: Secondary | ICD-10-CM | POA: Diagnosis not present

## 2015-08-23 DIAGNOSIS — I495 Sick sinus syndrome: Secondary | ICD-10-CM | POA: Diagnosis not present

## 2015-08-23 DIAGNOSIS — I10 Essential (primary) hypertension: Secondary | ICD-10-CM | POA: Diagnosis not present

## 2015-08-23 DIAGNOSIS — I519 Heart disease, unspecified: Secondary | ICD-10-CM | POA: Diagnosis not present

## 2015-08-23 DIAGNOSIS — I110 Hypertensive heart disease with heart failure: Secondary | ICD-10-CM | POA: Diagnosis not present

## 2015-08-27 DIAGNOSIS — M109 Gout, unspecified: Secondary | ICD-10-CM | POA: Diagnosis not present

## 2015-08-27 DIAGNOSIS — D539 Nutritional anemia, unspecified: Secondary | ICD-10-CM | POA: Diagnosis not present

## 2015-08-27 DIAGNOSIS — E7800 Pure hypercholesterolemia, unspecified: Secondary | ICD-10-CM | POA: Diagnosis not present

## 2015-08-27 DIAGNOSIS — R5383 Other fatigue: Secondary | ICD-10-CM | POA: Diagnosis not present

## 2015-08-27 DIAGNOSIS — Z1389 Encounter for screening for other disorder: Secondary | ICD-10-CM | POA: Diagnosis not present

## 2015-09-04 DIAGNOSIS — I509 Heart failure, unspecified: Secondary | ICD-10-CM | POA: Diagnosis not present

## 2015-09-04 DIAGNOSIS — N189 Chronic kidney disease, unspecified: Secondary | ICD-10-CM | POA: Diagnosis not present

## 2015-09-04 DIAGNOSIS — M8580 Other specified disorders of bone density and structure, unspecified site: Secondary | ICD-10-CM | POA: Diagnosis not present

## 2015-09-24 DIAGNOSIS — I350 Nonrheumatic aortic (valve) stenosis: Secondary | ICD-10-CM | POA: Diagnosis not present

## 2015-09-24 DIAGNOSIS — Z7901 Long term (current) use of anticoagulants: Secondary | ICD-10-CM | POA: Diagnosis not present

## 2015-09-24 DIAGNOSIS — Z950 Presence of cardiac pacemaker: Secondary | ICD-10-CM | POA: Diagnosis not present

## 2015-09-24 DIAGNOSIS — I340 Nonrheumatic mitral (valve) insufficiency: Secondary | ICD-10-CM | POA: Diagnosis not present

## 2015-09-24 DIAGNOSIS — I2510 Atherosclerotic heart disease of native coronary artery without angina pectoris: Secondary | ICD-10-CM | POA: Diagnosis not present

## 2015-09-24 DIAGNOSIS — I480 Paroxysmal atrial fibrillation: Secondary | ICD-10-CM | POA: Diagnosis not present

## 2015-09-24 DIAGNOSIS — I110 Hypertensive heart disease with heart failure: Secondary | ICD-10-CM | POA: Diagnosis not present

## 2015-09-27 DIAGNOSIS — Z7901 Long term (current) use of anticoagulants: Secondary | ICD-10-CM | POA: Diagnosis not present

## 2015-10-04 DIAGNOSIS — M109 Gout, unspecified: Secondary | ICD-10-CM | POA: Diagnosis not present

## 2015-10-16 DIAGNOSIS — C61 Malignant neoplasm of prostate: Secondary | ICD-10-CM | POA: Diagnosis not present

## 2015-10-17 DIAGNOSIS — R6889 Other general symptoms and signs: Secondary | ICD-10-CM | POA: Diagnosis not present

## 2015-10-23 DIAGNOSIS — Z7901 Long term (current) use of anticoagulants: Secondary | ICD-10-CM | POA: Diagnosis not present

## 2015-10-23 DIAGNOSIS — R6889 Other general symptoms and signs: Secondary | ICD-10-CM | POA: Diagnosis not present

## 2015-10-29 DIAGNOSIS — T18120A Food in esophagus causing compression of trachea, initial encounter: Secondary | ICD-10-CM | POA: Diagnosis not present

## 2015-10-29 DIAGNOSIS — J9811 Atelectasis: Secondary | ICD-10-CM | POA: Diagnosis not present

## 2015-10-29 DIAGNOSIS — C61 Malignant neoplasm of prostate: Secondary | ICD-10-CM | POA: Diagnosis not present

## 2015-10-29 DIAGNOSIS — I4891 Unspecified atrial fibrillation: Secondary | ICD-10-CM | POA: Diagnosis not present

## 2015-10-29 DIAGNOSIS — J4530 Mild persistent asthma, uncomplicated: Secondary | ICD-10-CM | POA: Diagnosis not present

## 2015-10-29 DIAGNOSIS — I350 Nonrheumatic aortic (valve) stenosis: Secondary | ICD-10-CM | POA: Diagnosis not present

## 2015-10-29 DIAGNOSIS — I2510 Atherosclerotic heart disease of native coronary artery without angina pectoris: Secondary | ICD-10-CM | POA: Diagnosis not present

## 2015-10-29 DIAGNOSIS — K222 Esophageal obstruction: Secondary | ICD-10-CM | POA: Diagnosis not present

## 2015-10-29 DIAGNOSIS — T18108A Unspecified foreign body in esophagus causing other injury, initial encounter: Secondary | ICD-10-CM | POA: Diagnosis not present

## 2015-10-29 DIAGNOSIS — T18128A Food in esophagus causing other injury, initial encounter: Secondary | ICD-10-CM | POA: Diagnosis not present

## 2015-10-29 DIAGNOSIS — Z79899 Other long term (current) drug therapy: Secondary | ICD-10-CM | POA: Diagnosis not present

## 2015-10-29 DIAGNOSIS — I10 Essential (primary) hypertension: Secondary | ICD-10-CM | POA: Diagnosis not present

## 2015-10-29 DIAGNOSIS — Z7901 Long term (current) use of anticoagulants: Secondary | ICD-10-CM | POA: Diagnosis not present

## 2015-10-29 DIAGNOSIS — M795 Residual foreign body in soft tissue: Secondary | ICD-10-CM | POA: Diagnosis not present

## 2015-10-29 DIAGNOSIS — K449 Diaphragmatic hernia without obstruction or gangrene: Secondary | ICD-10-CM | POA: Diagnosis not present

## 2015-10-29 DIAGNOSIS — Z950 Presence of cardiac pacemaker: Secondary | ICD-10-CM | POA: Diagnosis not present

## 2015-10-31 DIAGNOSIS — Z7901 Long term (current) use of anticoagulants: Secondary | ICD-10-CM | POA: Diagnosis not present

## 2015-11-14 DIAGNOSIS — K222 Esophageal obstruction: Secondary | ICD-10-CM | POA: Diagnosis not present

## 2015-11-14 DIAGNOSIS — R1310 Dysphagia, unspecified: Secondary | ICD-10-CM | POA: Diagnosis not present

## 2015-11-14 DIAGNOSIS — T18120A Food in esophagus causing compression of trachea, initial encounter: Secondary | ICD-10-CM | POA: Diagnosis not present

## 2015-11-23 ENCOUNTER — Other Ambulatory Visit: Payer: Self-pay

## 2015-11-23 NOTE — Patient Outreach (Signed)
[facility]) Care Management  11/23/2015  Dean [name] [birth year] [id number]    Telephone Screen  Referral Date: 11/23/15 Referral Source: [employer] Tier 4 List Referral Reason: "3-admissions, 1-HH claim, CHF, COPD"   Outreach attempt # 1 to patient. Patient reached. He states that he is doing well. He has no CM needs or concerns at this time. He has supportive family that helps with as needed. No issues with transportation or medications. He goes to see PCP(Dr. Lin Landsman) on 11/28/15. Patient appreciative of call but voices no need for [employer] services at this time. He has [facility] contact info to reference for future needs.   Plan: RN CM will notify [facility] administrative assistant of case closure. RN CM will send MD case closure letter.  Enzo Montgomery, RN,BSN,CCM [facility] Telephonic Care Management Coordinator Direct Phone: [phone] Toll Free: [phone] Fax: [phone]

## 2015-11-28 DIAGNOSIS — I4891 Unspecified atrial fibrillation: Secondary | ICD-10-CM | POA: Diagnosis not present

## 2015-11-28 DIAGNOSIS — J302 Other seasonal allergic rhinitis: Secondary | ICD-10-CM | POA: Diagnosis not present

## 2015-11-28 DIAGNOSIS — I10 Essential (primary) hypertension: Secondary | ICD-10-CM | POA: Diagnosis not present

## 2015-11-28 DIAGNOSIS — N189 Chronic kidney disease, unspecified: Secondary | ICD-10-CM | POA: Diagnosis not present

## 2015-11-28 DIAGNOSIS — Z0000 Encounter for general adult medical examination without abnormal findings: Secondary | ICD-10-CM | POA: Diagnosis not present

## 2015-12-03 DIAGNOSIS — Z7901 Long term (current) use of anticoagulants: Secondary | ICD-10-CM | POA: Diagnosis not present

## 2015-12-04 DIAGNOSIS — K219 Gastro-esophageal reflux disease without esophagitis: Secondary | ICD-10-CM | POA: Diagnosis not present

## 2015-12-04 DIAGNOSIS — K449 Diaphragmatic hernia without obstruction or gangrene: Secondary | ICD-10-CM | POA: Diagnosis not present

## 2015-12-04 DIAGNOSIS — Z79899 Other long term (current) drug therapy: Secondary | ICD-10-CM | POA: Diagnosis not present

## 2015-12-04 DIAGNOSIS — Z8546 Personal history of malignant neoplasm of prostate: Secondary | ICD-10-CM | POA: Diagnosis not present

## 2015-12-04 DIAGNOSIS — Z9049 Acquired absence of other specified parts of digestive tract: Secondary | ICD-10-CM | POA: Diagnosis not present

## 2015-12-04 DIAGNOSIS — K222 Esophageal obstruction: Secondary | ICD-10-CM | POA: Diagnosis not present

## 2015-12-04 DIAGNOSIS — I10 Essential (primary) hypertension: Secondary | ICD-10-CM | POA: Diagnosis not present

## 2015-12-04 DIAGNOSIS — I4891 Unspecified atrial fibrillation: Secondary | ICD-10-CM | POA: Diagnosis not present

## 2015-12-04 DIAGNOSIS — J45909 Unspecified asthma, uncomplicated: Secondary | ICD-10-CM | POA: Diagnosis not present

## 2015-12-10 DIAGNOSIS — I129 Hypertensive chronic kidney disease with stage 1 through stage 4 chronic kidney disease, or unspecified chronic kidney disease: Secondary | ICD-10-CM | POA: Diagnosis not present

## 2015-12-10 DIAGNOSIS — I509 Heart failure, unspecified: Secondary | ICD-10-CM | POA: Diagnosis not present

## 2015-12-10 DIAGNOSIS — I259 Chronic ischemic heart disease, unspecified: Secondary | ICD-10-CM | POA: Diagnosis not present

## 2015-12-10 DIAGNOSIS — M8580 Other specified disorders of bone density and structure, unspecified site: Secondary | ICD-10-CM | POA: Diagnosis not present

## 2015-12-10 DIAGNOSIS — M109 Gout, unspecified: Secondary | ICD-10-CM | POA: Diagnosis not present

## 2015-12-10 DIAGNOSIS — N183 Chronic kidney disease, stage 3 (moderate): Secondary | ICD-10-CM | POA: Diagnosis not present

## 2015-12-10 DIAGNOSIS — D631 Anemia in chronic kidney disease: Secondary | ICD-10-CM | POA: Diagnosis not present

## 2015-12-10 DIAGNOSIS — N2581 Secondary hyperparathyroidism of renal origin: Secondary | ICD-10-CM | POA: Diagnosis not present

## 2015-12-10 DIAGNOSIS — D509 Iron deficiency anemia, unspecified: Secondary | ICD-10-CM | POA: Diagnosis not present

## 2015-12-10 DIAGNOSIS — I498 Other specified cardiac arrhythmias: Secondary | ICD-10-CM | POA: Diagnosis not present

## 2015-12-10 DIAGNOSIS — I639 Cerebral infarction, unspecified: Secondary | ICD-10-CM | POA: Diagnosis not present

## 2015-12-10 DIAGNOSIS — C61 Malignant neoplasm of prostate: Secondary | ICD-10-CM | POA: Diagnosis not present

## 2015-12-11 ENCOUNTER — Other Ambulatory Visit: Payer: Self-pay | Admitting: Nephrology

## 2015-12-11 DIAGNOSIS — N183 Chronic kidney disease, stage 3 unspecified: Secondary | ICD-10-CM

## 2015-12-12 ENCOUNTER — Ambulatory Visit
Admission: RE | Admit: 2015-12-12 | Discharge: 2015-12-12 | Disposition: A | Discharge status: Home / Self Care | Payer: Medicare Other | Source: Ambulatory Visit | Attending: Nephrology | Admitting: Nephrology

## 2015-12-12 DIAGNOSIS — N183 Chronic kidney disease, stage 3 unspecified: Secondary | ICD-10-CM

## 2015-12-12 DIAGNOSIS — N2889 Other specified disorders of kidney and ureter: Secondary | ICD-10-CM | POA: Diagnosis not present

## 2015-12-28 DIAGNOSIS — Z7901 Long term (current) use of anticoagulants: Secondary | ICD-10-CM | POA: Diagnosis not present

## 2016-01-02 DIAGNOSIS — M4802 Spinal stenosis, cervical region: Secondary | ICD-10-CM | POA: Diagnosis not present

## 2016-01-02 DIAGNOSIS — M5030 Other cervical disc degeneration, unspecified cervical region: Secondary | ICD-10-CM | POA: Diagnosis not present

## 2016-01-14 DIAGNOSIS — D509 Iron deficiency anemia, unspecified: Secondary | ICD-10-CM | POA: Diagnosis not present

## 2016-01-14 DIAGNOSIS — D473 Essential (hemorrhagic) thrombocythemia: Secondary | ICD-10-CM | POA: Diagnosis not present

## 2016-01-14 DIAGNOSIS — D47Z9 Other specified neoplasms of uncertain behavior of lymphoid, hematopoietic and related tissue: Secondary | ICD-10-CM | POA: Diagnosis not present

## 2016-01-15 DIAGNOSIS — N359 Urethral stricture, unspecified: Secondary | ICD-10-CM | POA: Diagnosis not present

## 2016-01-16 DIAGNOSIS — N320 Bladder-neck obstruction: Secondary | ICD-10-CM | POA: Diagnosis not present

## 2016-01-16 DIAGNOSIS — R319 Hematuria, unspecified: Secondary | ICD-10-CM | POA: Diagnosis not present

## 2016-01-16 DIAGNOSIS — Z7901 Long term (current) use of anticoagulants: Secondary | ICD-10-CM | POA: Diagnosis not present

## 2016-01-16 DIAGNOSIS — N139 Obstructive and reflux uropathy, unspecified: Secondary | ICD-10-CM | POA: Diagnosis not present

## 2016-01-16 DIAGNOSIS — I4891 Unspecified atrial fibrillation: Secondary | ICD-10-CM | POA: Diagnosis not present

## 2016-01-17 DIAGNOSIS — R339 Retention of urine, unspecified: Secondary | ICD-10-CM | POA: Diagnosis not present

## 2016-01-28 DIAGNOSIS — D473 Essential (hemorrhagic) thrombocythemia: Secondary | ICD-10-CM | POA: Diagnosis not present

## 2016-01-28 DIAGNOSIS — I259 Chronic ischemic heart disease, unspecified: Secondary | ICD-10-CM | POA: Diagnosis not present

## 2016-01-28 DIAGNOSIS — I498 Other specified cardiac arrhythmias: Secondary | ICD-10-CM | POA: Diagnosis not present

## 2016-01-28 DIAGNOSIS — C61 Malignant neoplasm of prostate: Secondary | ICD-10-CM | POA: Diagnosis not present

## 2016-01-28 DIAGNOSIS — I129 Hypertensive chronic kidney disease with stage 1 through stage 4 chronic kidney disease, or unspecified chronic kidney disease: Secondary | ICD-10-CM | POA: Diagnosis not present

## 2016-01-28 DIAGNOSIS — D631 Anemia in chronic kidney disease: Secondary | ICD-10-CM | POA: Diagnosis not present

## 2016-01-28 DIAGNOSIS — M109 Gout, unspecified: Secondary | ICD-10-CM | POA: Diagnosis not present

## 2016-01-28 DIAGNOSIS — I509 Heart failure, unspecified: Secondary | ICD-10-CM | POA: Diagnosis not present

## 2016-01-28 DIAGNOSIS — I639 Cerebral infarction, unspecified: Secondary | ICD-10-CM | POA: Diagnosis not present

## 2016-01-28 DIAGNOSIS — N183 Chronic kidney disease, stage 3 (moderate): Secondary | ICD-10-CM | POA: Diagnosis not present

## 2016-01-28 DIAGNOSIS — N2581 Secondary hyperparathyroidism of renal origin: Secondary | ICD-10-CM | POA: Diagnosis not present

## 2016-01-28 DIAGNOSIS — J309 Allergic rhinitis, unspecified: Secondary | ICD-10-CM | POA: Diagnosis not present

## 2016-01-29 DIAGNOSIS — Z7901 Long term (current) use of anticoagulants: Secondary | ICD-10-CM | POA: Diagnosis not present

## 2016-02-14 DIAGNOSIS — R339 Retention of urine, unspecified: Secondary | ICD-10-CM | POA: Diagnosis not present

## 2016-02-14 DIAGNOSIS — N359 Urethral stricture, unspecified: Secondary | ICD-10-CM | POA: Diagnosis not present

## 2016-02-25 DIAGNOSIS — R300 Dysuria: Secondary | ICD-10-CM | POA: Diagnosis not present

## 2016-02-25 DIAGNOSIS — C61 Malignant neoplasm of prostate: Secondary | ICD-10-CM | POA: Diagnosis not present

## 2016-02-25 DIAGNOSIS — R339 Retention of urine, unspecified: Secondary | ICD-10-CM | POA: Diagnosis not present

## 2016-02-29 DIAGNOSIS — Z7901 Long term (current) use of anticoagulants: Secondary | ICD-10-CM | POA: Diagnosis not present

## 2016-02-29 DIAGNOSIS — N2581 Secondary hyperparathyroidism of renal origin: Secondary | ICD-10-CM | POA: Diagnosis not present

## 2016-02-29 DIAGNOSIS — N189 Chronic kidney disease, unspecified: Secondary | ICD-10-CM | POA: Diagnosis not present

## 2016-03-04 DIAGNOSIS — N359 Urethral stricture, unspecified: Secondary | ICD-10-CM | POA: Diagnosis not present

## 2016-03-05 DIAGNOSIS — E875 Hyperkalemia: Secondary | ICD-10-CM | POA: Diagnosis not present

## 2016-03-20 DIAGNOSIS — R195 Other fecal abnormalities: Secondary | ICD-10-CM | POA: Diagnosis not present

## 2016-03-20 DIAGNOSIS — K219 Gastro-esophageal reflux disease without esophagitis: Secondary | ICD-10-CM | POA: Diagnosis not present

## 2016-03-20 DIAGNOSIS — K921 Melena: Secondary | ICD-10-CM | POA: Diagnosis not present

## 2016-03-24 DIAGNOSIS — H539 Unspecified visual disturbance: Secondary | ICD-10-CM | POA: Diagnosis not present

## 2016-03-24 DIAGNOSIS — H02831 Dermatochalasis of right upper eyelid: Secondary | ICD-10-CM | POA: Diagnosis not present

## 2016-03-24 DIAGNOSIS — H35343 Macular cyst, hole, or pseudohole, bilateral: Secondary | ICD-10-CM | POA: Diagnosis not present

## 2016-03-24 DIAGNOSIS — H02834 Dermatochalasis of left upper eyelid: Secondary | ICD-10-CM | POA: Diagnosis not present

## 2016-03-26 DIAGNOSIS — I480 Paroxysmal atrial fibrillation: Secondary | ICD-10-CM | POA: Diagnosis not present

## 2016-03-26 DIAGNOSIS — I2510 Atherosclerotic heart disease of native coronary artery without angina pectoris: Secondary | ICD-10-CM | POA: Diagnosis not present

## 2016-03-26 DIAGNOSIS — Z7901 Long term (current) use of anticoagulants: Secondary | ICD-10-CM | POA: Diagnosis not present

## 2016-03-26 DIAGNOSIS — I110 Hypertensive heart disease with heart failure: Secondary | ICD-10-CM | POA: Diagnosis not present

## 2016-03-26 DIAGNOSIS — I350 Nonrheumatic aortic (valve) stenosis: Secondary | ICD-10-CM | POA: Diagnosis not present

## 2016-03-27 DIAGNOSIS — Z950 Presence of cardiac pacemaker: Secondary | ICD-10-CM | POA: Diagnosis not present

## 2016-03-31 DIAGNOSIS — I10 Essential (primary) hypertension: Secondary | ICD-10-CM | POA: Diagnosis not present

## 2016-03-31 DIAGNOSIS — I639 Cerebral infarction, unspecified: Secondary | ICD-10-CM | POA: Diagnosis not present

## 2016-03-31 DIAGNOSIS — Z7901 Long term (current) use of anticoagulants: Secondary | ICD-10-CM | POA: Diagnosis not present

## 2016-03-31 DIAGNOSIS — M8580 Other specified disorders of bone density and structure, unspecified site: Secondary | ICD-10-CM | POA: Diagnosis not present

## 2016-03-31 DIAGNOSIS — K219 Gastro-esophageal reflux disease without esophagitis: Secondary | ICD-10-CM | POA: Diagnosis not present

## 2016-03-31 DIAGNOSIS — I350 Nonrheumatic aortic (valve) stenosis: Secondary | ICD-10-CM | POA: Diagnosis not present

## 2016-04-04 DIAGNOSIS — Z7901 Long term (current) use of anticoagulants: Secondary | ICD-10-CM | POA: Diagnosis not present

## 2016-04-15 DIAGNOSIS — C61 Malignant neoplasm of prostate: Secondary | ICD-10-CM | POA: Diagnosis not present

## 2016-04-23 DIAGNOSIS — L821 Other seborrheic keratosis: Secondary | ICD-10-CM | POA: Diagnosis not present

## 2016-04-23 DIAGNOSIS — M859 Disorder of bone density and structure, unspecified: Secondary | ICD-10-CM | POA: Diagnosis not present

## 2016-04-23 DIAGNOSIS — L300 Nummular dermatitis: Secondary | ICD-10-CM | POA: Diagnosis not present

## 2016-04-23 DIAGNOSIS — Z13820 Encounter for screening for osteoporosis: Secondary | ICD-10-CM | POA: Diagnosis not present

## 2016-04-23 DIAGNOSIS — M8580 Other specified disorders of bone density and structure, unspecified site: Secondary | ICD-10-CM | POA: Diagnosis not present

## 2016-04-28 DIAGNOSIS — I480 Paroxysmal atrial fibrillation: Secondary | ICD-10-CM | POA: Diagnosis not present

## 2016-04-28 DIAGNOSIS — Z45018 Encounter for adjustment and management of other part of cardiac pacemaker: Secondary | ICD-10-CM | POA: Diagnosis not present

## 2016-05-09 DIAGNOSIS — Z7901 Long term (current) use of anticoagulants: Secondary | ICD-10-CM | POA: Diagnosis not present

## 2016-06-02 DIAGNOSIS — Z7901 Long term (current) use of anticoagulants: Secondary | ICD-10-CM | POA: Diagnosis not present

## 2016-06-03 DIAGNOSIS — I498 Other specified cardiac arrhythmias: Secondary | ICD-10-CM | POA: Diagnosis not present

## 2016-06-03 DIAGNOSIS — I639 Cerebral infarction, unspecified: Secondary | ICD-10-CM | POA: Diagnosis not present

## 2016-06-03 DIAGNOSIS — D473 Essential (hemorrhagic) thrombocythemia: Secondary | ICD-10-CM | POA: Diagnosis not present

## 2016-06-03 DIAGNOSIS — M109 Gout, unspecified: Secondary | ICD-10-CM | POA: Diagnosis not present

## 2016-06-03 DIAGNOSIS — N2581 Secondary hyperparathyroidism of renal origin: Secondary | ICD-10-CM | POA: Diagnosis not present

## 2016-06-03 DIAGNOSIS — J309 Allergic rhinitis, unspecified: Secondary | ICD-10-CM | POA: Diagnosis not present

## 2016-06-03 DIAGNOSIS — I509 Heart failure, unspecified: Secondary | ICD-10-CM | POA: Diagnosis not present

## 2016-06-03 DIAGNOSIS — D631 Anemia in chronic kidney disease: Secondary | ICD-10-CM | POA: Diagnosis not present

## 2016-06-03 DIAGNOSIS — N183 Chronic kidney disease, stage 3 (moderate): Secondary | ICD-10-CM | POA: Diagnosis not present

## 2016-06-03 DIAGNOSIS — I259 Chronic ischemic heart disease, unspecified: Secondary | ICD-10-CM | POA: Diagnosis not present

## 2016-06-03 DIAGNOSIS — C61 Malignant neoplasm of prostate: Secondary | ICD-10-CM | POA: Diagnosis not present

## 2016-06-03 DIAGNOSIS — I129 Hypertensive chronic kidney disease with stage 1 through stage 4 chronic kidney disease, or unspecified chronic kidney disease: Secondary | ICD-10-CM | POA: Diagnosis not present

## 2016-06-06 DIAGNOSIS — N183 Chronic kidney disease, stage 3 (moderate): Secondary | ICD-10-CM | POA: Diagnosis not present

## 2016-06-06 DIAGNOSIS — Z7901 Long term (current) use of anticoagulants: Secondary | ICD-10-CM | POA: Diagnosis not present

## 2016-06-12 DIAGNOSIS — Z0000 Encounter for general adult medical examination without abnormal findings: Secondary | ICD-10-CM | POA: Diagnosis not present

## 2016-06-12 DIAGNOSIS — Z7901 Long term (current) use of anticoagulants: Secondary | ICD-10-CM | POA: Diagnosis not present

## 2016-06-17 DIAGNOSIS — Z950 Presence of cardiac pacemaker: Secondary | ICD-10-CM | POA: Diagnosis not present

## 2016-06-17 DIAGNOSIS — D473 Essential (hemorrhagic) thrombocythemia: Secondary | ICD-10-CM | POA: Diagnosis not present

## 2016-06-17 DIAGNOSIS — I110 Hypertensive heart disease with heart failure: Secondary | ICD-10-CM | POA: Diagnosis not present

## 2016-06-17 DIAGNOSIS — Z7901 Long term (current) use of anticoagulants: Secondary | ICD-10-CM | POA: Diagnosis not present

## 2016-06-17 DIAGNOSIS — I350 Nonrheumatic aortic (valve) stenosis: Secondary | ICD-10-CM | POA: Diagnosis not present

## 2016-06-17 DIAGNOSIS — I2510 Atherosclerotic heart disease of native coronary artery without angina pectoris: Secondary | ICD-10-CM | POA: Diagnosis not present

## 2016-06-17 DIAGNOSIS — I480 Paroxysmal atrial fibrillation: Secondary | ICD-10-CM | POA: Diagnosis not present

## 2016-06-19 DIAGNOSIS — R079 Chest pain, unspecified: Secondary | ICD-10-CM | POA: Diagnosis not present

## 2016-06-19 DIAGNOSIS — R0602 Shortness of breath: Secondary | ICD-10-CM | POA: Diagnosis not present

## 2016-06-19 DIAGNOSIS — I700 Atherosclerosis of aorta: Secondary | ICD-10-CM | POA: Diagnosis not present

## 2016-06-19 DIAGNOSIS — I517 Cardiomegaly: Secondary | ICD-10-CM | POA: Diagnosis not present

## 2016-06-23 DIAGNOSIS — Z7901 Long term (current) use of anticoagulants: Secondary | ICD-10-CM | POA: Diagnosis not present

## 2016-06-23 DIAGNOSIS — I480 Paroxysmal atrial fibrillation: Secondary | ICD-10-CM | POA: Diagnosis not present

## 2016-06-27 DIAGNOSIS — Z8673 Personal history of transient ischemic attack (TIA), and cerebral infarction without residual deficits: Secondary | ICD-10-CM | POA: Diagnosis not present

## 2016-06-27 DIAGNOSIS — Z950 Presence of cardiac pacemaker: Secondary | ICD-10-CM | POA: Diagnosis not present

## 2016-06-27 DIAGNOSIS — J439 Emphysema, unspecified: Secondary | ICD-10-CM | POA: Diagnosis not present

## 2016-06-27 DIAGNOSIS — C61 Malignant neoplasm of prostate: Secondary | ICD-10-CM | POA: Diagnosis not present

## 2016-06-27 DIAGNOSIS — I509 Heart failure, unspecified: Secondary | ICD-10-CM | POA: Diagnosis not present

## 2016-06-27 DIAGNOSIS — I110 Hypertensive heart disease with heart failure: Secondary | ICD-10-CM | POA: Diagnosis not present

## 2016-06-27 DIAGNOSIS — I739 Peripheral vascular disease, unspecified: Secondary | ICD-10-CM | POA: Diagnosis not present

## 2016-06-27 DIAGNOSIS — I2582 Chronic total occlusion of coronary artery: Secondary | ICD-10-CM | POA: Diagnosis not present

## 2016-06-27 DIAGNOSIS — I480 Paroxysmal atrial fibrillation: Secondary | ICD-10-CM | POA: Diagnosis not present

## 2016-06-27 DIAGNOSIS — M1990 Unspecified osteoarthritis, unspecified site: Secondary | ICD-10-CM | POA: Diagnosis not present

## 2016-06-27 DIAGNOSIS — I10 Essential (primary) hypertension: Secondary | ICD-10-CM | POA: Diagnosis not present

## 2016-06-27 DIAGNOSIS — E785 Hyperlipidemia, unspecified: Secondary | ICD-10-CM | POA: Diagnosis not present

## 2016-06-27 DIAGNOSIS — I350 Nonrheumatic aortic (valve) stenosis: Secondary | ICD-10-CM | POA: Diagnosis not present

## 2016-06-27 DIAGNOSIS — Z79899 Other long term (current) drug therapy: Secondary | ICD-10-CM | POA: Diagnosis not present

## 2016-06-27 DIAGNOSIS — I2510 Atherosclerotic heart disease of native coronary artery without angina pectoris: Secondary | ICD-10-CM | POA: Diagnosis not present

## 2016-06-27 DIAGNOSIS — Z8249 Family history of ischemic heart disease and other diseases of the circulatory system: Secondary | ICD-10-CM | POA: Diagnosis not present

## 2016-07-09 DIAGNOSIS — Z7901 Long term (current) use of anticoagulants: Secondary | ICD-10-CM | POA: Diagnosis not present

## 2016-07-15 DIAGNOSIS — D473 Essential (hemorrhagic) thrombocythemia: Secondary | ICD-10-CM | POA: Diagnosis not present

## 2016-07-21 DIAGNOSIS — N359 Urethral stricture, unspecified: Secondary | ICD-10-CM | POA: Diagnosis not present

## 2016-07-29 DIAGNOSIS — Z950 Presence of cardiac pacemaker: Secondary | ICD-10-CM | POA: Diagnosis not present

## 2016-07-30 DIAGNOSIS — J449 Chronic obstructive pulmonary disease, unspecified: Secondary | ICD-10-CM | POA: Diagnosis not present

## 2016-07-30 DIAGNOSIS — Z01818 Encounter for other preprocedural examination: Secondary | ICD-10-CM | POA: Diagnosis not present

## 2016-07-30 DIAGNOSIS — I480 Paroxysmal atrial fibrillation: Secondary | ICD-10-CM | POA: Diagnosis not present

## 2016-07-30 DIAGNOSIS — Z8673 Personal history of transient ischemic attack (TIA), and cerebral infarction without residual deficits: Secondary | ICD-10-CM | POA: Diagnosis not present

## 2016-07-30 DIAGNOSIS — I712 Thoracic aortic aneurysm, without rupture: Secondary | ICD-10-CM | POA: Diagnosis not present

## 2016-07-30 DIAGNOSIS — R0602 Shortness of breath: Secondary | ICD-10-CM | POA: Diagnosis not present

## 2016-07-30 DIAGNOSIS — I77810 Thoracic aortic ectasia: Secondary | ICD-10-CM | POA: Diagnosis not present

## 2016-07-30 DIAGNOSIS — D6949 Other primary thrombocytopenia: Secondary | ICD-10-CM | POA: Diagnosis not present

## 2016-07-30 DIAGNOSIS — M5010 Cervical disc disorder with radiculopathy, unspecified cervical region: Secondary | ICD-10-CM | POA: Diagnosis not present

## 2016-07-30 DIAGNOSIS — G4734 Idiopathic sleep related nonobstructive alveolar hypoventilation: Secondary | ICD-10-CM | POA: Diagnosis not present

## 2016-07-30 DIAGNOSIS — I110 Hypertensive heart disease with heart failure: Secondary | ICD-10-CM | POA: Diagnosis not present

## 2016-07-30 DIAGNOSIS — E785 Hyperlipidemia, unspecified: Secondary | ICD-10-CM | POA: Diagnosis not present

## 2016-07-30 DIAGNOSIS — I2510 Atherosclerotic heart disease of native coronary artery without angina pectoris: Secondary | ICD-10-CM | POA: Diagnosis not present

## 2016-07-30 DIAGNOSIS — I495 Sick sinus syndrome: Secondary | ICD-10-CM | POA: Diagnosis not present

## 2016-07-30 DIAGNOSIS — J45909 Unspecified asthma, uncomplicated: Secondary | ICD-10-CM | POA: Diagnosis not present

## 2016-07-30 DIAGNOSIS — Z7901 Long term (current) use of anticoagulants: Secondary | ICD-10-CM | POA: Diagnosis not present

## 2016-07-30 DIAGNOSIS — I739 Peripheral vascular disease, unspecified: Secondary | ICD-10-CM | POA: Diagnosis not present

## 2016-07-30 DIAGNOSIS — D473 Essential (hemorrhagic) thrombocythemia: Secondary | ICD-10-CM | POA: Diagnosis not present

## 2016-07-30 DIAGNOSIS — M1990 Unspecified osteoarthritis, unspecified site: Secondary | ICD-10-CM | POA: Diagnosis not present

## 2016-07-30 DIAGNOSIS — T82120D Displacement of cardiac electrode, subsequent encounter: Secondary | ICD-10-CM | POA: Diagnosis not present

## 2016-07-30 DIAGNOSIS — J439 Emphysema, unspecified: Secondary | ICD-10-CM | POA: Diagnosis not present

## 2016-07-30 DIAGNOSIS — G2581 Restless legs syndrome: Secondary | ICD-10-CM | POA: Diagnosis not present

## 2016-07-30 DIAGNOSIS — I350 Nonrheumatic aortic (valve) stenosis: Secondary | ICD-10-CM | POA: Diagnosis not present

## 2016-07-30 DIAGNOSIS — M4802 Spinal stenosis, cervical region: Secondary | ICD-10-CM | POA: Diagnosis not present

## 2016-07-30 DIAGNOSIS — M109 Gout, unspecified: Secondary | ICD-10-CM | POA: Diagnosis not present

## 2016-07-30 DIAGNOSIS — I517 Cardiomegaly: Secondary | ICD-10-CM | POA: Diagnosis not present

## 2016-07-30 DIAGNOSIS — Z950 Presence of cardiac pacemaker: Secondary | ICD-10-CM | POA: Diagnosis not present

## 2016-07-30 DIAGNOSIS — E875 Hyperkalemia: Secondary | ICD-10-CM | POA: Diagnosis not present

## 2016-07-30 DIAGNOSIS — I340 Nonrheumatic mitral (valve) insufficiency: Secondary | ICD-10-CM | POA: Diagnosis not present

## 2016-07-31 DIAGNOSIS — Z7901 Long term (current) use of anticoagulants: Secondary | ICD-10-CM | POA: Diagnosis not present

## 2016-08-01 DIAGNOSIS — M4802 Spinal stenosis, cervical region: Secondary | ICD-10-CM | POA: Diagnosis not present

## 2016-08-01 DIAGNOSIS — M1990 Unspecified osteoarthritis, unspecified site: Secondary | ICD-10-CM | POA: Diagnosis not present

## 2016-08-01 DIAGNOSIS — G2581 Restless legs syndrome: Secondary | ICD-10-CM | POA: Diagnosis not present

## 2016-08-01 DIAGNOSIS — M5030 Other cervical disc degeneration, unspecified cervical region: Secondary | ICD-10-CM | POA: Diagnosis not present

## 2016-08-19 DIAGNOSIS — I517 Cardiomegaly: Secondary | ICD-10-CM | POA: Diagnosis not present

## 2016-08-19 DIAGNOSIS — Z8739 Personal history of other diseases of the musculoskeletal system and connective tissue: Secondary | ICD-10-CM | POA: Diagnosis not present

## 2016-08-19 DIAGNOSIS — G2581 Restless legs syndrome: Secondary | ICD-10-CM | POA: Diagnosis not present

## 2016-08-19 DIAGNOSIS — Z8673 Personal history of transient ischemic attack (TIA), and cerebral infarction without residual deficits: Secondary | ICD-10-CM | POA: Diagnosis not present

## 2016-08-19 DIAGNOSIS — G4734 Idiopathic sleep related nonobstructive alveolar hypoventilation: Secondary | ICD-10-CM | POA: Diagnosis present

## 2016-08-19 DIAGNOSIS — I739 Peripheral vascular disease, unspecified: Secondary | ICD-10-CM | POA: Diagnosis present

## 2016-08-19 DIAGNOSIS — Z01818 Encounter for other preprocedural examination: Secondary | ICD-10-CM | POA: Diagnosis not present

## 2016-08-19 DIAGNOSIS — Z9841 Cataract extraction status, right eye: Secondary | ICD-10-CM | POA: Diagnosis not present

## 2016-08-19 DIAGNOSIS — J439 Emphysema, unspecified: Secondary | ICD-10-CM | POA: Diagnosis present

## 2016-08-19 DIAGNOSIS — I2510 Atherosclerotic heart disease of native coronary artery without angina pectoris: Secondary | ICD-10-CM | POA: Diagnosis not present

## 2016-08-19 DIAGNOSIS — J90 Pleural effusion, not elsewhere classified: Secondary | ICD-10-CM | POA: Diagnosis not present

## 2016-08-19 DIAGNOSIS — J449 Chronic obstructive pulmonary disease, unspecified: Secondary | ICD-10-CM | POA: Diagnosis not present

## 2016-08-19 DIAGNOSIS — Z79899 Other long term (current) drug therapy: Secondary | ICD-10-CM | POA: Diagnosis not present

## 2016-08-19 DIAGNOSIS — R918 Other nonspecific abnormal finding of lung field: Secondary | ICD-10-CM | POA: Diagnosis not present

## 2016-08-19 DIAGNOSIS — Z8546 Personal history of malignant neoplasm of prostate: Secondary | ICD-10-CM | POA: Diagnosis not present

## 2016-08-19 DIAGNOSIS — Z961 Presence of intraocular lens: Secondary | ICD-10-CM | POA: Diagnosis present

## 2016-08-19 DIAGNOSIS — I110 Hypertensive heart disease with heart failure: Secondary | ICD-10-CM | POA: Diagnosis present

## 2016-08-19 DIAGNOSIS — I480 Paroxysmal atrial fibrillation: Secondary | ICD-10-CM | POA: Diagnosis not present

## 2016-08-19 DIAGNOSIS — Z952 Presence of prosthetic heart valve: Secondary | ICD-10-CM | POA: Diagnosis not present

## 2016-08-19 DIAGNOSIS — J45998 Other asthma: Secondary | ICD-10-CM | POA: Diagnosis not present

## 2016-08-19 DIAGNOSIS — I495 Sick sinus syndrome: Secondary | ICD-10-CM | POA: Diagnosis not present

## 2016-08-19 DIAGNOSIS — I350 Nonrheumatic aortic (valve) stenosis: Secondary | ICD-10-CM | POA: Diagnosis not present

## 2016-08-19 DIAGNOSIS — I340 Nonrheumatic mitral (valve) insufficiency: Secondary | ICD-10-CM | POA: Diagnosis not present

## 2016-08-19 DIAGNOSIS — Z8701 Personal history of pneumonia (recurrent): Secondary | ICD-10-CM | POA: Diagnosis not present

## 2016-08-19 DIAGNOSIS — I119 Hypertensive heart disease without heart failure: Secondary | ICD-10-CM | POA: Diagnosis not present

## 2016-08-19 DIAGNOSIS — I361 Nonrheumatic tricuspid (valve) insufficiency: Secondary | ICD-10-CM | POA: Diagnosis not present

## 2016-08-19 DIAGNOSIS — Z01810 Encounter for preprocedural cardiovascular examination: Secondary | ICD-10-CM | POA: Diagnosis not present

## 2016-08-19 DIAGNOSIS — D6949 Other primary thrombocytopenia: Secondary | ICD-10-CM | POA: Diagnosis not present

## 2016-08-19 DIAGNOSIS — R0989 Other specified symptoms and signs involving the circulatory and respiratory systems: Secondary | ICD-10-CM | POA: Diagnosis not present

## 2016-08-19 DIAGNOSIS — I509 Heart failure, unspecified: Secondary | ICD-10-CM | POA: Diagnosis present

## 2016-08-19 DIAGNOSIS — Z9889 Other specified postprocedural states: Secondary | ICD-10-CM | POA: Diagnosis not present

## 2016-08-19 DIAGNOSIS — R9431 Abnormal electrocardiogram [ECG] [EKG]: Secondary | ICD-10-CM | POA: Diagnosis not present

## 2016-08-19 DIAGNOSIS — Z006 Encounter for examination for normal comparison and control in clinical research program: Secondary | ICD-10-CM | POA: Diagnosis not present

## 2016-08-19 DIAGNOSIS — E784 Other hyperlipidemia: Secondary | ICD-10-CM | POA: Diagnosis not present

## 2016-08-19 DIAGNOSIS — I519 Heart disease, unspecified: Secondary | ICD-10-CM | POA: Diagnosis not present

## 2016-08-19 DIAGNOSIS — Z9842 Cataract extraction status, left eye: Secondary | ICD-10-CM | POA: Diagnosis not present

## 2016-08-19 DIAGNOSIS — E785 Hyperlipidemia, unspecified: Secondary | ICD-10-CM | POA: Diagnosis present

## 2016-08-19 DIAGNOSIS — Z950 Presence of cardiac pacemaker: Secondary | ICD-10-CM | POA: Diagnosis not present

## 2016-08-19 DIAGNOSIS — I351 Nonrheumatic aortic (valve) insufficiency: Secondary | ICD-10-CM | POA: Diagnosis not present

## 2016-08-19 DIAGNOSIS — Z7951 Long term (current) use of inhaled steroids: Secondary | ICD-10-CM | POA: Diagnosis not present

## 2016-08-19 DIAGNOSIS — Z7982 Long term (current) use of aspirin: Secondary | ICD-10-CM | POA: Diagnosis not present

## 2016-08-19 DIAGNOSIS — M109 Gout, unspecified: Secondary | ICD-10-CM | POA: Diagnosis present

## 2016-08-19 DIAGNOSIS — Z7901 Long term (current) use of anticoagulants: Secondary | ICD-10-CM | POA: Diagnosis not present

## 2016-09-01 DIAGNOSIS — Z7901 Long term (current) use of anticoagulants: Secondary | ICD-10-CM | POA: Diagnosis not present

## 2016-09-02 DIAGNOSIS — C61 Malignant neoplasm of prostate: Secondary | ICD-10-CM | POA: Diagnosis not present

## 2016-09-08 DIAGNOSIS — Z7901 Long term (current) use of anticoagulants: Secondary | ICD-10-CM | POA: Diagnosis not present

## 2016-09-10 DIAGNOSIS — E785 Hyperlipidemia, unspecified: Secondary | ICD-10-CM | POA: Diagnosis not present

## 2016-09-10 DIAGNOSIS — I5022 Chronic systolic (congestive) heart failure: Secondary | ICD-10-CM | POA: Diagnosis not present

## 2016-09-10 DIAGNOSIS — I350 Nonrheumatic aortic (valve) stenosis: Secondary | ICD-10-CM | POA: Diagnosis not present

## 2016-09-10 DIAGNOSIS — I495 Sick sinus syndrome: Secondary | ICD-10-CM | POA: Diagnosis not present

## 2016-09-10 DIAGNOSIS — I480 Paroxysmal atrial fibrillation: Secondary | ICD-10-CM | POA: Diagnosis not present

## 2016-09-10 DIAGNOSIS — I110 Hypertensive heart disease with heart failure: Secondary | ICD-10-CM | POA: Diagnosis not present

## 2016-09-10 DIAGNOSIS — Z950 Presence of cardiac pacemaker: Secondary | ICD-10-CM | POA: Diagnosis not present

## 2016-09-10 DIAGNOSIS — I2510 Atherosclerotic heart disease of native coronary artery without angina pectoris: Secondary | ICD-10-CM | POA: Diagnosis not present

## 2016-09-10 DIAGNOSIS — Z952 Presence of prosthetic heart valve: Secondary | ICD-10-CM | POA: Diagnosis not present

## 2016-09-15 DIAGNOSIS — I350 Nonrheumatic aortic (valve) stenosis: Secondary | ICD-10-CM | POA: Diagnosis not present

## 2016-09-15 DIAGNOSIS — Z952 Presence of prosthetic heart valve: Secondary | ICD-10-CM | POA: Diagnosis not present

## 2016-09-16 DIAGNOSIS — S8255XA Nondisplaced fracture of medial malleolus of left tibia, initial encounter for closed fracture: Secondary | ICD-10-CM | POA: Diagnosis not present

## 2016-09-16 DIAGNOSIS — S82832A Other fracture of upper and lower end of left fibula, initial encounter for closed fracture: Secondary | ICD-10-CM | POA: Diagnosis not present

## 2016-09-19 DIAGNOSIS — S82842A Displaced bimalleolar fracture of left lower leg, initial encounter for closed fracture: Secondary | ICD-10-CM | POA: Diagnosis not present

## 2016-09-19 DIAGNOSIS — S82832A Other fracture of upper and lower end of left fibula, initial encounter for closed fracture: Secondary | ICD-10-CM | POA: Diagnosis not present

## 2016-09-25 DIAGNOSIS — M8580 Other specified disorders of bone density and structure, unspecified site: Secondary | ICD-10-CM | POA: Diagnosis not present

## 2016-09-25 DIAGNOSIS — S82402A Unspecified fracture of shaft of left fibula, initial encounter for closed fracture: Secondary | ICD-10-CM | POA: Diagnosis not present

## 2016-09-25 DIAGNOSIS — S82892A Other fracture of left lower leg, initial encounter for closed fracture: Secondary | ICD-10-CM | POA: Diagnosis not present

## 2016-09-26 DIAGNOSIS — S82842A Displaced bimalleolar fracture of left lower leg, initial encounter for closed fracture: Secondary | ICD-10-CM | POA: Diagnosis not present

## 2016-10-07 DIAGNOSIS — N183 Chronic kidney disease, stage 3 (moderate): Secondary | ICD-10-CM | POA: Diagnosis not present

## 2016-10-07 DIAGNOSIS — I509 Heart failure, unspecified: Secondary | ICD-10-CM | POA: Diagnosis not present

## 2016-10-07 DIAGNOSIS — D631 Anemia in chronic kidney disease: Secondary | ICD-10-CM | POA: Diagnosis not present

## 2016-10-07 DIAGNOSIS — D509 Iron deficiency anemia, unspecified: Secondary | ICD-10-CM | POA: Diagnosis not present

## 2016-10-07 DIAGNOSIS — I129 Hypertensive chronic kidney disease with stage 1 through stage 4 chronic kidney disease, or unspecified chronic kidney disease: Secondary | ICD-10-CM | POA: Diagnosis not present

## 2016-10-07 DIAGNOSIS — I259 Chronic ischemic heart disease, unspecified: Secondary | ICD-10-CM | POA: Diagnosis not present

## 2016-10-07 DIAGNOSIS — N390 Urinary tract infection, site not specified: Secondary | ICD-10-CM | POA: Diagnosis not present

## 2016-10-07 DIAGNOSIS — E875 Hyperkalemia: Secondary | ICD-10-CM | POA: Diagnosis not present

## 2016-10-07 DIAGNOSIS — D473 Essential (hemorrhagic) thrombocythemia: Secondary | ICD-10-CM | POA: Diagnosis not present

## 2016-10-07 DIAGNOSIS — C61 Malignant neoplasm of prostate: Secondary | ICD-10-CM | POA: Diagnosis not present

## 2016-10-07 DIAGNOSIS — I639 Cerebral infarction, unspecified: Secondary | ICD-10-CM | POA: Diagnosis not present

## 2016-10-07 DIAGNOSIS — N2581 Secondary hyperparathyroidism of renal origin: Secondary | ICD-10-CM | POA: Diagnosis not present

## 2016-10-07 DIAGNOSIS — J309 Allergic rhinitis, unspecified: Secondary | ICD-10-CM | POA: Diagnosis not present

## 2016-10-08 DIAGNOSIS — Z1389 Encounter for screening for other disorder: Secondary | ICD-10-CM | POA: Diagnosis not present

## 2016-10-08 DIAGNOSIS — I10 Essential (primary) hypertension: Secondary | ICD-10-CM | POA: Diagnosis not present

## 2016-10-08 DIAGNOSIS — N189 Chronic kidney disease, unspecified: Secondary | ICD-10-CM | POA: Diagnosis not present

## 2016-10-08 DIAGNOSIS — N183 Chronic kidney disease, stage 3 (moderate): Secondary | ICD-10-CM | POA: Diagnosis not present

## 2016-10-08 DIAGNOSIS — Z9181 History of falling: Secondary | ICD-10-CM | POA: Diagnosis not present

## 2016-10-13 DIAGNOSIS — Z79899 Other long term (current) drug therapy: Secondary | ICD-10-CM | POA: Diagnosis not present

## 2016-10-13 DIAGNOSIS — J449 Chronic obstructive pulmonary disease, unspecified: Secondary | ICD-10-CM | POA: Diagnosis present

## 2016-10-13 DIAGNOSIS — E785 Hyperlipidemia, unspecified: Secondary | ICD-10-CM | POA: Diagnosis present

## 2016-10-13 DIAGNOSIS — Z8546 Personal history of malignant neoplasm of prostate: Secondary | ICD-10-CM | POA: Diagnosis not present

## 2016-10-13 DIAGNOSIS — I350 Nonrheumatic aortic (valve) stenosis: Secondary | ICD-10-CM | POA: Diagnosis not present

## 2016-10-13 DIAGNOSIS — Z7982 Long term (current) use of aspirin: Secondary | ICD-10-CM | POA: Diagnosis not present

## 2016-10-13 DIAGNOSIS — N189 Chronic kidney disease, unspecified: Secondary | ICD-10-CM | POA: Diagnosis present

## 2016-10-13 DIAGNOSIS — K219 Gastro-esophageal reflux disease without esophagitis: Secondary | ICD-10-CM | POA: Diagnosis present

## 2016-10-13 DIAGNOSIS — I5022 Chronic systolic (congestive) heart failure: Secondary | ICD-10-CM | POA: Diagnosis present

## 2016-10-13 DIAGNOSIS — E7800 Pure hypercholesterolemia, unspecified: Secondary | ICD-10-CM | POA: Diagnosis present

## 2016-10-13 DIAGNOSIS — S82842A Displaced bimalleolar fracture of left lower leg, initial encounter for closed fracture: Secondary | ICD-10-CM | POA: Diagnosis present

## 2016-10-13 DIAGNOSIS — I5020 Unspecified systolic (congestive) heart failure: Secondary | ICD-10-CM | POA: Diagnosis not present

## 2016-10-13 DIAGNOSIS — M1990 Unspecified osteoarthritis, unspecified site: Secondary | ICD-10-CM | POA: Diagnosis present

## 2016-10-13 DIAGNOSIS — I10 Essential (primary) hypertension: Secondary | ICD-10-CM

## 2016-10-13 DIAGNOSIS — Z7901 Long term (current) use of anticoagulants: Secondary | ICD-10-CM | POA: Diagnosis not present

## 2016-10-13 DIAGNOSIS — R011 Cardiac murmur, unspecified: Secondary | ICD-10-CM | POA: Diagnosis present

## 2016-10-13 DIAGNOSIS — Z950 Presence of cardiac pacemaker: Secondary | ICD-10-CM | POA: Diagnosis not present

## 2016-10-13 DIAGNOSIS — M109 Gout, unspecified: Secondary | ICD-10-CM | POA: Diagnosis present

## 2016-10-13 DIAGNOSIS — S82832A Other fracture of upper and lower end of left fibula, initial encounter for closed fracture: Secondary | ICD-10-CM | POA: Diagnosis not present

## 2016-10-13 DIAGNOSIS — I2510 Atherosclerotic heart disease of native coronary artery without angina pectoris: Secondary | ICD-10-CM | POA: Diagnosis present

## 2016-10-13 DIAGNOSIS — F0390 Unspecified dementia without behavioral disturbance: Secondary | ICD-10-CM | POA: Diagnosis present

## 2016-10-13 DIAGNOSIS — Z952 Presence of prosthetic heart valve: Secondary | ICD-10-CM | POA: Diagnosis not present

## 2016-10-13 DIAGNOSIS — I130 Hypertensive heart and chronic kidney disease with heart failure and stage 1 through stage 4 chronic kidney disease, or unspecified chronic kidney disease: Secondary | ICD-10-CM | POA: Diagnosis present

## 2016-10-13 DIAGNOSIS — I4891 Unspecified atrial fibrillation: Secondary | ICD-10-CM | POA: Diagnosis not present

## 2016-10-15 DIAGNOSIS — S82892A Other fracture of left lower leg, initial encounter for closed fracture: Secondary | ICD-10-CM | POA: Diagnosis not present

## 2016-10-15 DIAGNOSIS — M6281 Muscle weakness (generalized): Secondary | ICD-10-CM | POA: Diagnosis not present

## 2016-10-24 DIAGNOSIS — Z7901 Long term (current) use of anticoagulants: Secondary | ICD-10-CM | POA: Diagnosis not present

## 2016-10-24 DIAGNOSIS — S82842A Displaced bimalleolar fracture of left lower leg, initial encounter for closed fracture: Secondary | ICD-10-CM | POA: Diagnosis not present

## 2016-10-29 DIAGNOSIS — Z950 Presence of cardiac pacemaker: Secondary | ICD-10-CM | POA: Diagnosis not present

## 2016-11-04 DIAGNOSIS — S82842A Displaced bimalleolar fracture of left lower leg, initial encounter for closed fracture: Secondary | ICD-10-CM | POA: Diagnosis not present

## 2016-11-04 DIAGNOSIS — D473 Essential (hemorrhagic) thrombocythemia: Secondary | ICD-10-CM | POA: Diagnosis not present

## 2016-11-18 DIAGNOSIS — S82842A Displaced bimalleolar fracture of left lower leg, initial encounter for closed fracture: Secondary | ICD-10-CM | POA: Diagnosis not present

## 2016-11-26 DIAGNOSIS — Z8673 Personal history of transient ischemic attack (TIA), and cerebral infarction without residual deficits: Secondary | ICD-10-CM | POA: Diagnosis not present

## 2016-11-26 DIAGNOSIS — J189 Pneumonia, unspecified organism: Secondary | ICD-10-CM | POA: Diagnosis not present

## 2016-11-26 DIAGNOSIS — J45901 Unspecified asthma with (acute) exacerbation: Secondary | ICD-10-CM | POA: Diagnosis not present

## 2016-11-26 DIAGNOSIS — Z7901 Long term (current) use of anticoagulants: Secondary | ICD-10-CM | POA: Diagnosis not present

## 2016-12-16 DIAGNOSIS — S82842A Displaced bimalleolar fracture of left lower leg, initial encounter for closed fracture: Secondary | ICD-10-CM | POA: Diagnosis not present

## 2017-01-01 DIAGNOSIS — S82842A Displaced bimalleolar fracture of left lower leg, initial encounter for closed fracture: Secondary | ICD-10-CM | POA: Diagnosis not present

## 2017-01-01 DIAGNOSIS — Z7901 Long term (current) use of anticoagulants: Secondary | ICD-10-CM | POA: Diagnosis not present

## 2017-01-06 DIAGNOSIS — Z1389 Encounter for screening for other disorder: Secondary | ICD-10-CM | POA: Diagnosis not present

## 2017-01-06 DIAGNOSIS — H6091 Unspecified otitis externa, right ear: Secondary | ICD-10-CM | POA: Diagnosis not present

## 2017-01-06 DIAGNOSIS — J309 Allergic rhinitis, unspecified: Secondary | ICD-10-CM | POA: Diagnosis not present

## 2017-01-06 DIAGNOSIS — Z0000 Encounter for general adult medical examination without abnormal findings: Secondary | ICD-10-CM | POA: Diagnosis not present

## 2017-01-06 DIAGNOSIS — Z6827 Body mass index (BMI) 27.0-27.9, adult: Secondary | ICD-10-CM | POA: Diagnosis not present

## 2017-01-09 DIAGNOSIS — I350 Nonrheumatic aortic (valve) stenosis: Secondary | ICD-10-CM | POA: Diagnosis not present

## 2017-01-09 DIAGNOSIS — R0602 Shortness of breath: Secondary | ICD-10-CM | POA: Diagnosis not present

## 2017-01-09 DIAGNOSIS — Z7901 Long term (current) use of anticoagulants: Secondary | ICD-10-CM | POA: Diagnosis not present

## 2017-01-09 DIAGNOSIS — Z952 Presence of prosthetic heart valve: Secondary | ICD-10-CM | POA: Diagnosis not present

## 2017-01-21 DIAGNOSIS — Z6827 Body mass index (BMI) 27.0-27.9, adult: Secondary | ICD-10-CM | POA: Diagnosis not present

## 2017-01-21 DIAGNOSIS — I509 Heart failure, unspecified: Secondary | ICD-10-CM | POA: Diagnosis not present

## 2017-01-21 DIAGNOSIS — E7800 Pure hypercholesterolemia, unspecified: Secondary | ICD-10-CM | POA: Diagnosis not present

## 2017-01-21 DIAGNOSIS — Z1389 Encounter for screening for other disorder: Secondary | ICD-10-CM | POA: Diagnosis not present

## 2017-01-21 DIAGNOSIS — Z9181 History of falling: Secondary | ICD-10-CM | POA: Diagnosis not present

## 2017-01-21 DIAGNOSIS — Z79899 Other long term (current) drug therapy: Secondary | ICD-10-CM | POA: Diagnosis not present

## 2017-01-21 DIAGNOSIS — Z7901 Long term (current) use of anticoagulants: Secondary | ICD-10-CM | POA: Diagnosis not present

## 2017-01-21 DIAGNOSIS — M8580 Other specified disorders of bone density and structure, unspecified site: Secondary | ICD-10-CM | POA: Diagnosis not present

## 2017-01-28 DIAGNOSIS — Z950 Presence of cardiac pacemaker: Secondary | ICD-10-CM | POA: Diagnosis not present

## 2017-02-02 DIAGNOSIS — I350 Nonrheumatic aortic (valve) stenosis: Secondary | ICD-10-CM | POA: Diagnosis not present

## 2017-02-02 DIAGNOSIS — I495 Sick sinus syndrome: Secondary | ICD-10-CM | POA: Diagnosis not present

## 2017-02-02 DIAGNOSIS — R0602 Shortness of breath: Secondary | ICD-10-CM | POA: Diagnosis not present

## 2017-02-17 DIAGNOSIS — E875 Hyperkalemia: Secondary | ICD-10-CM | POA: Diagnosis not present

## 2017-02-17 DIAGNOSIS — D631 Anemia in chronic kidney disease: Secondary | ICD-10-CM | POA: Diagnosis not present

## 2017-02-17 DIAGNOSIS — I259 Chronic ischemic heart disease, unspecified: Secondary | ICD-10-CM | POA: Diagnosis not present

## 2017-02-17 DIAGNOSIS — I509 Heart failure, unspecified: Secondary | ICD-10-CM | POA: Diagnosis not present

## 2017-02-17 DIAGNOSIS — I498 Other specified cardiac arrhythmias: Secondary | ICD-10-CM | POA: Diagnosis not present

## 2017-02-17 DIAGNOSIS — D509 Iron deficiency anemia, unspecified: Secondary | ICD-10-CM | POA: Diagnosis not present

## 2017-02-17 DIAGNOSIS — D473 Essential (hemorrhagic) thrombocythemia: Secondary | ICD-10-CM | POA: Diagnosis not present

## 2017-02-17 DIAGNOSIS — N2581 Secondary hyperparathyroidism of renal origin: Secondary | ICD-10-CM | POA: Diagnosis not present

## 2017-02-17 DIAGNOSIS — M109 Gout, unspecified: Secondary | ICD-10-CM | POA: Diagnosis not present

## 2017-02-17 DIAGNOSIS — I129 Hypertensive chronic kidney disease with stage 1 through stage 4 chronic kidney disease, or unspecified chronic kidney disease: Secondary | ICD-10-CM | POA: Diagnosis not present

## 2017-02-17 DIAGNOSIS — E669 Obesity, unspecified: Secondary | ICD-10-CM | POA: Diagnosis not present

## 2017-02-17 DIAGNOSIS — N183 Chronic kidney disease, stage 3 (moderate): Secondary | ICD-10-CM | POA: Diagnosis not present

## 2017-02-18 DIAGNOSIS — Z7901 Long term (current) use of anticoagulants: Secondary | ICD-10-CM | POA: Diagnosis not present

## 2017-03-03 DIAGNOSIS — Z7982 Long term (current) use of aspirin: Secondary | ICD-10-CM | POA: Diagnosis not present

## 2017-03-03 DIAGNOSIS — D473 Essential (hemorrhagic) thrombocythemia: Secondary | ICD-10-CM | POA: Diagnosis not present

## 2017-03-17 DIAGNOSIS — H903 Sensorineural hearing loss, bilateral: Secondary | ICD-10-CM | POA: Diagnosis not present

## 2017-03-19 DIAGNOSIS — Z6827 Body mass index (BMI) 27.0-27.9, adult: Secondary | ICD-10-CM | POA: Diagnosis not present

## 2017-03-19 DIAGNOSIS — J00 Acute nasopharyngitis [common cold]: Secondary | ICD-10-CM | POA: Diagnosis not present

## 2017-03-24 DIAGNOSIS — C61 Malignant neoplasm of prostate: Secondary | ICD-10-CM | POA: Diagnosis not present

## 2017-03-30 DIAGNOSIS — Z7901 Long term (current) use of anticoagulants: Secondary | ICD-10-CM | POA: Diagnosis not present

## 2017-04-09 DIAGNOSIS — Z7901 Long term (current) use of anticoagulants: Secondary | ICD-10-CM | POA: Diagnosis not present

## 2017-04-09 DIAGNOSIS — R5383 Other fatigue: Secondary | ICD-10-CM | POA: Diagnosis not present

## 2017-04-09 DIAGNOSIS — Z6827 Body mass index (BMI) 27.0-27.9, adult: Secondary | ICD-10-CM | POA: Diagnosis not present

## 2017-04-09 DIAGNOSIS — M791 Myalgia: Secondary | ICD-10-CM | POA: Diagnosis not present

## 2017-04-28 DIAGNOSIS — C4441 Basal cell carcinoma of skin of scalp and neck: Secondary | ICD-10-CM | POA: Diagnosis not present

## 2017-04-28 DIAGNOSIS — D485 Neoplasm of uncertain behavior of skin: Secondary | ICD-10-CM | POA: Diagnosis not present

## 2017-04-30 DIAGNOSIS — Z45018 Encounter for adjustment and management of other part of cardiac pacemaker: Secondary | ICD-10-CM | POA: Diagnosis not present

## 2017-04-30 DIAGNOSIS — I495 Sick sinus syndrome: Secondary | ICD-10-CM | POA: Diagnosis not present

## 2017-05-08 DIAGNOSIS — R339 Retention of urine, unspecified: Secondary | ICD-10-CM | POA: Diagnosis not present

## 2017-05-08 DIAGNOSIS — N359 Urethral stricture, unspecified: Secondary | ICD-10-CM | POA: Diagnosis not present

## 2017-05-11 DIAGNOSIS — Z7901 Long term (current) use of anticoagulants: Secondary | ICD-10-CM | POA: Diagnosis not present

## 2017-05-14 DIAGNOSIS — H524 Presbyopia: Secondary | ICD-10-CM | POA: Diagnosis not present

## 2017-05-14 DIAGNOSIS — H35373 Puckering of macula, bilateral: Secondary | ICD-10-CM | POA: Diagnosis not present

## 2017-05-14 DIAGNOSIS — H02834 Dermatochalasis of left upper eyelid: Secondary | ICD-10-CM | POA: Diagnosis not present

## 2017-05-14 DIAGNOSIS — H02831 Dermatochalasis of right upper eyelid: Secondary | ICD-10-CM | POA: Diagnosis not present

## 2017-05-22 DIAGNOSIS — N359 Urethral stricture, unspecified: Secondary | ICD-10-CM | POA: Diagnosis not present

## 2017-06-09 DIAGNOSIS — C61 Malignant neoplasm of prostate: Secondary | ICD-10-CM | POA: Diagnosis not present

## 2017-06-09 DIAGNOSIS — R300 Dysuria: Secondary | ICD-10-CM | POA: Diagnosis not present

## 2017-06-15 DIAGNOSIS — J440 Chronic obstructive pulmonary disease with acute lower respiratory infection: Secondary | ICD-10-CM | POA: Diagnosis present

## 2017-06-15 DIAGNOSIS — Z952 Presence of prosthetic heart valve: Secondary | ICD-10-CM | POA: Diagnosis not present

## 2017-06-15 DIAGNOSIS — Z79891 Long term (current) use of opiate analgesic: Secondary | ICD-10-CM | POA: Diagnosis not present

## 2017-06-15 DIAGNOSIS — Z950 Presence of cardiac pacemaker: Secondary | ICD-10-CM | POA: Diagnosis not present

## 2017-06-15 DIAGNOSIS — J189 Pneumonia, unspecified organism: Secondary | ICD-10-CM | POA: Diagnosis not present

## 2017-06-15 DIAGNOSIS — J9601 Acute respiratory failure with hypoxia: Secondary | ICD-10-CM | POA: Diagnosis present

## 2017-06-15 DIAGNOSIS — R0902 Hypoxemia: Secondary | ICD-10-CM | POA: Diagnosis not present

## 2017-06-15 DIAGNOSIS — N183 Chronic kidney disease, stage 3 (moderate): Secondary | ICD-10-CM | POA: Diagnosis present

## 2017-06-15 DIAGNOSIS — J4531 Mild persistent asthma with (acute) exacerbation: Secondary | ICD-10-CM | POA: Diagnosis not present

## 2017-06-15 DIAGNOSIS — R748 Abnormal levels of other serum enzymes: Secondary | ICD-10-CM | POA: Diagnosis not present

## 2017-06-15 DIAGNOSIS — I480 Paroxysmal atrial fibrillation: Secondary | ICD-10-CM | POA: Diagnosis present

## 2017-06-15 DIAGNOSIS — Z7901 Long term (current) use of anticoagulants: Secondary | ICD-10-CM | POA: Diagnosis not present

## 2017-06-15 DIAGNOSIS — I4891 Unspecified atrial fibrillation: Secondary | ICD-10-CM | POA: Diagnosis not present

## 2017-06-15 DIAGNOSIS — R7989 Other specified abnormal findings of blood chemistry: Secondary | ICD-10-CM | POA: Diagnosis present

## 2017-06-15 DIAGNOSIS — I5032 Chronic diastolic (congestive) heart failure: Secondary | ICD-10-CM | POA: Diagnosis present

## 2017-06-15 DIAGNOSIS — I482 Chronic atrial fibrillation: Secondary | ICD-10-CM | POA: Diagnosis present

## 2017-06-15 DIAGNOSIS — Z23 Encounter for immunization: Secondary | ICD-10-CM | POA: Diagnosis not present

## 2017-06-15 DIAGNOSIS — Z7982 Long term (current) use of aspirin: Secondary | ICD-10-CM | POA: Diagnosis not present

## 2017-06-15 DIAGNOSIS — J180 Bronchopneumonia, unspecified organism: Secondary | ICD-10-CM | POA: Diagnosis present

## 2017-06-15 DIAGNOSIS — R0600 Dyspnea, unspecified: Secondary | ICD-10-CM | POA: Diagnosis not present

## 2017-06-15 DIAGNOSIS — D509 Iron deficiency anemia, unspecified: Secondary | ICD-10-CM | POA: Diagnosis present

## 2017-06-15 DIAGNOSIS — R05 Cough: Secondary | ICD-10-CM | POA: Diagnosis not present

## 2017-06-15 DIAGNOSIS — R778 Other specified abnormalities of plasma proteins: Secondary | ICD-10-CM | POA: Diagnosis not present

## 2017-06-15 DIAGNOSIS — Z79899 Other long term (current) drug therapy: Secondary | ICD-10-CM | POA: Diagnosis not present

## 2017-06-15 DIAGNOSIS — N179 Acute kidney failure, unspecified: Secondary | ICD-10-CM | POA: Diagnosis present

## 2017-06-15 DIAGNOSIS — I2699 Other pulmonary embolism without acute cor pulmonale: Secondary | ICD-10-CM | POA: Diagnosis present

## 2017-06-15 DIAGNOSIS — I130 Hypertensive heart and chronic kidney disease with heart failure and stage 1 through stage 4 chronic kidney disease, or unspecified chronic kidney disease: Secondary | ICD-10-CM | POA: Diagnosis present

## 2017-06-15 DIAGNOSIS — R918 Other nonspecific abnormal finding of lung field: Secondary | ICD-10-CM | POA: Diagnosis not present

## 2017-06-15 DIAGNOSIS — I2510 Atherosclerotic heart disease of native coronary artery without angina pectoris: Secondary | ICD-10-CM | POA: Diagnosis not present

## 2017-06-15 DIAGNOSIS — D473 Essential (hemorrhagic) thrombocythemia: Secondary | ICD-10-CM | POA: Diagnosis not present

## 2017-06-15 DIAGNOSIS — A419 Sepsis, unspecified organism: Secondary | ICD-10-CM | POA: Diagnosis not present

## 2017-06-15 DIAGNOSIS — J449 Chronic obstructive pulmonary disease, unspecified: Secondary | ICD-10-CM | POA: Diagnosis not present

## 2017-06-22 DIAGNOSIS — C61 Malignant neoplasm of prostate: Secondary | ICD-10-CM | POA: Diagnosis not present

## 2017-06-29 DIAGNOSIS — E559 Vitamin D deficiency, unspecified: Secondary | ICD-10-CM | POA: Diagnosis not present

## 2017-06-29 DIAGNOSIS — N189 Chronic kidney disease, unspecified: Secondary | ICD-10-CM | POA: Diagnosis not present

## 2017-06-29 DIAGNOSIS — I509 Heart failure, unspecified: Secondary | ICD-10-CM | POA: Diagnosis not present

## 2017-06-29 DIAGNOSIS — Z6827 Body mass index (BMI) 27.0-27.9, adult: Secondary | ICD-10-CM | POA: Diagnosis not present

## 2017-06-29 DIAGNOSIS — Z8619 Personal history of other infectious and parasitic diseases: Secondary | ICD-10-CM | POA: Diagnosis not present

## 2017-07-03 DIAGNOSIS — Z7901 Long term (current) use of anticoagulants: Secondary | ICD-10-CM | POA: Diagnosis not present

## 2017-07-07 DIAGNOSIS — C61 Malignant neoplasm of prostate: Secondary | ICD-10-CM | POA: Diagnosis not present

## 2017-07-09 DIAGNOSIS — D473 Essential (hemorrhagic) thrombocythemia: Secondary | ICD-10-CM | POA: Diagnosis not present

## 2017-07-10 DIAGNOSIS — Z7901 Long term (current) use of anticoagulants: Secondary | ICD-10-CM | POA: Diagnosis not present

## 2017-07-10 DIAGNOSIS — M25512 Pain in left shoulder: Secondary | ICD-10-CM | POA: Diagnosis not present

## 2017-07-10 DIAGNOSIS — D473 Essential (hemorrhagic) thrombocythemia: Secondary | ICD-10-CM | POA: Diagnosis not present

## 2017-07-10 DIAGNOSIS — M75102 Unspecified rotator cuff tear or rupture of left shoulder, not specified as traumatic: Secondary | ICD-10-CM | POA: Diagnosis not present

## 2017-07-15 DIAGNOSIS — D473 Essential (hemorrhagic) thrombocythemia: Secondary | ICD-10-CM | POA: Diagnosis not present

## 2017-07-20 DIAGNOSIS — M4802 Spinal stenosis, cervical region: Secondary | ICD-10-CM | POA: Diagnosis not present

## 2017-07-20 DIAGNOSIS — M5030 Other cervical disc degeneration, unspecified cervical region: Secondary | ICD-10-CM | POA: Diagnosis not present

## 2017-07-20 DIAGNOSIS — Z5181 Encounter for therapeutic drug level monitoring: Secondary | ICD-10-CM | POA: Diagnosis not present

## 2017-08-07 DIAGNOSIS — Z7901 Long term (current) use of anticoagulants: Secondary | ICD-10-CM | POA: Diagnosis not present

## 2017-08-10 DIAGNOSIS — M25511 Pain in right shoulder: Secondary | ICD-10-CM | POA: Diagnosis not present

## 2017-08-10 DIAGNOSIS — M75101 Unspecified rotator cuff tear or rupture of right shoulder, not specified as traumatic: Secondary | ICD-10-CM | POA: Diagnosis not present

## 2017-08-11 DIAGNOSIS — Z7901 Long term (current) use of anticoagulants: Secondary | ICD-10-CM | POA: Diagnosis not present

## 2017-09-01 DIAGNOSIS — E669 Obesity, unspecified: Secondary | ICD-10-CM | POA: Diagnosis not present

## 2017-09-01 DIAGNOSIS — I509 Heart failure, unspecified: Secondary | ICD-10-CM | POA: Diagnosis not present

## 2017-09-01 DIAGNOSIS — Z952 Presence of prosthetic heart valve: Secondary | ICD-10-CM | POA: Diagnosis not present

## 2017-09-01 DIAGNOSIS — M109 Gout, unspecified: Secondary | ICD-10-CM | POA: Diagnosis not present

## 2017-09-01 DIAGNOSIS — I259 Chronic ischemic heart disease, unspecified: Secondary | ICD-10-CM | POA: Diagnosis not present

## 2017-09-01 DIAGNOSIS — D631 Anemia in chronic kidney disease: Secondary | ICD-10-CM | POA: Diagnosis not present

## 2017-09-01 DIAGNOSIS — N183 Chronic kidney disease, stage 3 (moderate): Secondary | ICD-10-CM | POA: Diagnosis not present

## 2017-09-01 DIAGNOSIS — D473 Essential (hemorrhagic) thrombocythemia: Secondary | ICD-10-CM | POA: Diagnosis not present

## 2017-09-01 DIAGNOSIS — N2581 Secondary hyperparathyroidism of renal origin: Secondary | ICD-10-CM | POA: Diagnosis not present

## 2017-09-01 DIAGNOSIS — E875 Hyperkalemia: Secondary | ICD-10-CM | POA: Diagnosis not present

## 2017-09-01 DIAGNOSIS — I129 Hypertensive chronic kidney disease with stage 1 through stage 4 chronic kidney disease, or unspecified chronic kidney disease: Secondary | ICD-10-CM | POA: Diagnosis not present

## 2017-09-01 DIAGNOSIS — I498 Other specified cardiac arrhythmias: Secondary | ICD-10-CM | POA: Diagnosis not present

## 2017-09-01 DIAGNOSIS — Z7901 Long term (current) use of anticoagulants: Secondary | ICD-10-CM | POA: Diagnosis not present

## 2017-09-03 DIAGNOSIS — E611 Iron deficiency: Secondary | ICD-10-CM | POA: Diagnosis not present

## 2017-09-03 DIAGNOSIS — D473 Essential (hemorrhagic) thrombocythemia: Secondary | ICD-10-CM | POA: Diagnosis not present

## 2017-09-24 DIAGNOSIS — Z7901 Long term (current) use of anticoagulants: Secondary | ICD-10-CM | POA: Diagnosis not present

## 2017-10-09 DIAGNOSIS — C61 Malignant neoplasm of prostate: Secondary | ICD-10-CM | POA: Diagnosis not present

## 2017-10-19 DIAGNOSIS — R918 Other nonspecific abnormal finding of lung field: Secondary | ICD-10-CM | POA: Diagnosis not present

## 2017-10-23 DIAGNOSIS — N35912 Unspecified bulbous urethral stricture, male: Secondary | ICD-10-CM | POA: Diagnosis not present

## 2017-11-02 DIAGNOSIS — Z7901 Long term (current) use of anticoagulants: Secondary | ICD-10-CM | POA: Diagnosis not present

## 2017-11-10 DIAGNOSIS — Z45018 Encounter for adjustment and management of other part of cardiac pacemaker: Secondary | ICD-10-CM | POA: Diagnosis not present

## 2017-11-10 DIAGNOSIS — I4891 Unspecified atrial fibrillation: Secondary | ICD-10-CM | POA: Diagnosis not present

## 2017-11-23 DIAGNOSIS — M75101 Unspecified rotator cuff tear or rupture of right shoulder, not specified as traumatic: Secondary | ICD-10-CM | POA: Diagnosis not present

## 2017-12-03 DIAGNOSIS — Z7901 Long term (current) use of anticoagulants: Secondary | ICD-10-CM | POA: Diagnosis not present

## 2017-12-04 DIAGNOSIS — R339 Retention of urine, unspecified: Secondary | ICD-10-CM | POA: Diagnosis not present

## 2017-12-04 DIAGNOSIS — C61 Malignant neoplasm of prostate: Secondary | ICD-10-CM | POA: Diagnosis not present

## 2017-12-10 DIAGNOSIS — Z7901 Long term (current) use of anticoagulants: Secondary | ICD-10-CM | POA: Diagnosis not present

## 2017-12-17 DIAGNOSIS — Z7901 Long term (current) use of anticoagulants: Secondary | ICD-10-CM | POA: Diagnosis not present

## 2017-12-22 DIAGNOSIS — Z7901 Long term (current) use of anticoagulants: Secondary | ICD-10-CM | POA: Diagnosis not present

## 2017-12-29 DIAGNOSIS — M542 Cervicalgia: Secondary | ICD-10-CM | POA: Diagnosis not present

## 2017-12-29 DIAGNOSIS — R29700 NIHSS score 0: Secondary | ICD-10-CM | POA: Diagnosis not present

## 2017-12-29 DIAGNOSIS — R4182 Altered mental status, unspecified: Secondary | ICD-10-CM | POA: Diagnosis not present

## 2017-12-29 DIAGNOSIS — Z79899 Other long term (current) drug therapy: Secondary | ICD-10-CM | POA: Diagnosis not present

## 2017-12-29 DIAGNOSIS — J449 Chronic obstructive pulmonary disease, unspecified: Secondary | ICD-10-CM | POA: Diagnosis not present

## 2017-12-29 DIAGNOSIS — E860 Dehydration: Secondary | ICD-10-CM | POA: Diagnosis not present

## 2017-12-29 DIAGNOSIS — I4891 Unspecified atrial fibrillation: Secondary | ICD-10-CM | POA: Diagnosis not present

## 2017-12-29 DIAGNOSIS — R0602 Shortness of breath: Secondary | ICD-10-CM | POA: Diagnosis not present

## 2017-12-29 DIAGNOSIS — Z79891 Long term (current) use of opiate analgesic: Secondary | ICD-10-CM | POA: Diagnosis not present

## 2017-12-29 DIAGNOSIS — Z7901 Long term (current) use of anticoagulants: Secondary | ICD-10-CM | POA: Diagnosis not present

## 2017-12-29 DIAGNOSIS — R531 Weakness: Secondary | ICD-10-CM | POA: Diagnosis not present

## 2017-12-29 DIAGNOSIS — R42 Dizziness and giddiness: Secondary | ICD-10-CM | POA: Diagnosis not present

## 2017-12-29 DIAGNOSIS — R0600 Dyspnea, unspecified: Secondary | ICD-10-CM | POA: Diagnosis not present

## 2017-12-29 DIAGNOSIS — Z7982 Long term (current) use of aspirin: Secondary | ICD-10-CM | POA: Diagnosis not present

## 2017-12-29 DIAGNOSIS — I10 Essential (primary) hypertension: Secondary | ICD-10-CM | POA: Diagnosis not present

## 2017-12-29 DIAGNOSIS — J4530 Mild persistent asthma, uncomplicated: Secondary | ICD-10-CM | POA: Diagnosis not present

## 2017-12-29 DIAGNOSIS — Z950 Presence of cardiac pacemaker: Secondary | ICD-10-CM | POA: Diagnosis not present

## 2017-12-31 DIAGNOSIS — Z1331 Encounter for screening for depression: Secondary | ICD-10-CM | POA: Diagnosis not present

## 2017-12-31 DIAGNOSIS — Z9181 History of falling: Secondary | ICD-10-CM | POA: Diagnosis not present

## 2017-12-31 DIAGNOSIS — Z1339 Encounter for screening examination for other mental health and behavioral disorders: Secondary | ICD-10-CM | POA: Diagnosis not present

## 2017-12-31 DIAGNOSIS — I509 Heart failure, unspecified: Secondary | ICD-10-CM | POA: Diagnosis not present

## 2017-12-31 DIAGNOSIS — J302 Other seasonal allergic rhinitis: Secondary | ICD-10-CM | POA: Diagnosis not present

## 2018-01-10 ENCOUNTER — Encounter (HOSPITAL_COMMUNITY): Payer: Self-pay | Admitting: Emergency Medicine

## 2018-01-10 ENCOUNTER — Other Ambulatory Visit: Payer: Self-pay

## 2018-01-10 ENCOUNTER — Encounter (HOSPITAL_COMMUNITY)
Admission: EM | Disposition: A | Discharge status: Home / Self Care | Payer: Self-pay | Source: Home / Self Care | Attending: Internal Medicine

## 2018-01-10 ENCOUNTER — Inpatient Hospital Stay (HOSPITAL_COMMUNITY)
Admission: EM | Admit: 2018-01-10 | Discharge: 2018-01-13 | DRG: 369 | Disposition: A | Discharge status: Home / Self Care | Payer: Medicare Other | Attending: Internal Medicine | Admitting: Internal Medicine

## 2018-01-10 DIAGNOSIS — T18108A Unspecified foreign body in esophagus causing other injury, initial encounter: Secondary | ICD-10-CM | POA: Diagnosis not present

## 2018-01-10 DIAGNOSIS — Z79899 Other long term (current) drug therapy: Secondary | ICD-10-CM

## 2018-01-10 DIAGNOSIS — I2510 Atherosclerotic heart disease of native coronary artery without angina pectoris: Secondary | ICD-10-CM | POA: Diagnosis present

## 2018-01-10 DIAGNOSIS — M109 Gout, unspecified: Secondary | ICD-10-CM | POA: Diagnosis present

## 2018-01-10 DIAGNOSIS — R079 Chest pain, unspecified: Secondary | ICD-10-CM | POA: Diagnosis present

## 2018-01-10 DIAGNOSIS — R1310 Dysphagia, unspecified: Secondary | ICD-10-CM | POA: Diagnosis not present

## 2018-01-10 DIAGNOSIS — T18128A Food in esophagus causing other injury, initial encounter: Secondary | ICD-10-CM | POA: Diagnosis not present

## 2018-01-10 DIAGNOSIS — F0390 Unspecified dementia without behavioral disturbance: Secondary | ICD-10-CM | POA: Diagnosis present

## 2018-01-10 DIAGNOSIS — Z950 Presence of cardiac pacemaker: Secondary | ICD-10-CM

## 2018-01-10 DIAGNOSIS — R0789 Other chest pain: Secondary | ICD-10-CM | POA: Diagnosis not present

## 2018-01-10 DIAGNOSIS — J449 Chronic obstructive pulmonary disease, unspecified: Secondary | ICD-10-CM | POA: Diagnosis present

## 2018-01-10 DIAGNOSIS — B3781 Candidal esophagitis: Secondary | ICD-10-CM | POA: Diagnosis not present

## 2018-01-10 DIAGNOSIS — I495 Sick sinus syndrome: Secondary | ICD-10-CM | POA: Diagnosis present

## 2018-01-10 DIAGNOSIS — Z953 Presence of xenogenic heart valve: Secondary | ICD-10-CM

## 2018-01-10 DIAGNOSIS — K222 Esophageal obstruction: Secondary | ICD-10-CM | POA: Diagnosis not present

## 2018-01-10 DIAGNOSIS — R05 Cough: Secondary | ICD-10-CM

## 2018-01-10 DIAGNOSIS — R748 Abnormal levels of other serum enzymes: Secondary | ICD-10-CM

## 2018-01-10 DIAGNOSIS — D693 Immune thrombocytopenic purpura: Secondary | ICD-10-CM | POA: Diagnosis not present

## 2018-01-10 DIAGNOSIS — Z7951 Long term (current) use of inhaled steroids: Secondary | ICD-10-CM

## 2018-01-10 DIAGNOSIS — K449 Diaphragmatic hernia without obstruction or gangrene: Secondary | ICD-10-CM | POA: Diagnosis not present

## 2018-01-10 DIAGNOSIS — I5032 Chronic diastolic (congestive) heart failure: Secondary | ICD-10-CM | POA: Diagnosis not present

## 2018-01-10 DIAGNOSIS — G4700 Insomnia, unspecified: Secondary | ICD-10-CM | POA: Diagnosis present

## 2018-01-10 DIAGNOSIS — Z8249 Family history of ischemic heart disease and other diseases of the circulatory system: Secondary | ICD-10-CM

## 2018-01-10 DIAGNOSIS — T18120A Food in esophagus causing compression of trachea, initial encounter: Secondary | ICD-10-CM | POA: Diagnosis not present

## 2018-01-10 DIAGNOSIS — Z8546 Personal history of malignant neoplasm of prostate: Secondary | ICD-10-CM

## 2018-01-10 DIAGNOSIS — E860 Dehydration: Secondary | ICD-10-CM | POA: Diagnosis present

## 2018-01-10 DIAGNOSIS — Z923 Personal history of irradiation: Secondary | ICD-10-CM

## 2018-01-10 DIAGNOSIS — R059 Cough, unspecified: Secondary | ICD-10-CM

## 2018-01-10 DIAGNOSIS — N179 Acute kidney failure, unspecified: Secondary | ICD-10-CM | POA: Diagnosis not present

## 2018-01-10 DIAGNOSIS — G629 Polyneuropathy, unspecified: Secondary | ICD-10-CM | POA: Diagnosis present

## 2018-01-10 DIAGNOSIS — Z7901 Long term (current) use of anticoagulants: Secondary | ICD-10-CM

## 2018-01-10 DIAGNOSIS — N183 Chronic kidney disease, stage 3 (moderate): Secondary | ICD-10-CM | POA: Diagnosis present

## 2018-01-10 DIAGNOSIS — I130 Hypertensive heart and chronic kidney disease with heart failure and stage 1 through stage 4 chronic kidney disease, or unspecified chronic kidney disease: Secondary | ICD-10-CM | POA: Diagnosis not present

## 2018-01-10 DIAGNOSIS — I480 Paroxysmal atrial fibrillation: Secondary | ICD-10-CM | POA: Diagnosis present

## 2018-01-10 DIAGNOSIS — I4510 Unspecified right bundle-branch block: Secondary | ICD-10-CM | POA: Diagnosis present

## 2018-01-10 DIAGNOSIS — Z7982 Long term (current) use of aspirin: Secondary | ICD-10-CM

## 2018-01-10 DIAGNOSIS — I249 Acute ischemic heart disease, unspecified: Secondary | ICD-10-CM | POA: Diagnosis not present

## 2018-01-10 DIAGNOSIS — E785 Hyperlipidemia, unspecified: Secondary | ICD-10-CM | POA: Diagnosis present

## 2018-01-10 HISTORY — PX: ESOPHAGOGASTRODUODENOSCOPY: SHX5428

## 2018-01-10 HISTORY — DX: Atherosclerotic heart disease of native coronary artery without angina pectoris: I25.10

## 2018-01-10 HISTORY — DX: Nonrheumatic aortic (valve) stenosis: I35.0

## 2018-01-10 LAB — CBC
HCT: 35.5 % — ABNORMAL LOW (ref 39.0–52.0)
HEMOGLOBIN: 11.6 g/dL — AB (ref 13.0–17.0)
MCH: 36.4 pg — AB (ref 26.0–34.0)
MCHC: 32.7 g/dL (ref 30.0–36.0)
MCV: 111.3 fL — ABNORMAL HIGH (ref 78.0–100.0)
PLATELETS: 751 10*3/uL — AB (ref 150–400)
RBC: 3.19 MIL/uL — AB (ref 4.22–5.81)
RDW: 15.5 % (ref 11.5–15.5)
WBC: 7.1 10*3/uL (ref 4.0–10.5)

## 2018-01-10 LAB — BASIC METABOLIC PANEL
ANION GAP: 8 (ref 5–15)
BUN: 25 mg/dL — ABNORMAL HIGH (ref 6–20)
CALCIUM: 8.5 mg/dL — AB (ref 8.9–10.3)
CHLORIDE: 106 mmol/L (ref 101–111)
CO2: 24 mmol/L (ref 22–32)
CREATININE: 1.3 mg/dL — AB (ref 0.61–1.24)
GFR calc non Af Amer: 46 mL/min — ABNORMAL LOW (ref >60)
GFR, EST AFRICAN AMERICAN: 54 mL/min — AB (ref >60)
Glucose, Bld: 97 mg/dL (ref 65–99)
Potassium: 3.9 mmol/L (ref 3.5–5.1)
SODIUM: 138 mmol/L (ref 135–145)

## 2018-01-10 LAB — I-STAT TROPONIN, ED: Troponin i, poc: 0.11 ng/mL (ref 0.00–0.08)

## 2018-01-10 LAB — TROPONIN I
Troponin I: 0.03 ng/mL (ref <0.03)
Troponin I: 0.03 ng/mL (ref <0.03)

## 2018-01-10 LAB — TSH: TSH: 1.125 u[IU]/mL (ref 0.350–4.500)

## 2018-01-10 LAB — PROTIME-INR
INR: 2.29
PROTHROMBIN TIME: 25 s — AB (ref 11.4–15.2)

## 2018-01-10 SURGERY — EGD (ESOPHAGOGASTRODUODENOSCOPY)
Anesthesia: Moderate Sedation

## 2018-01-10 MED ORDER — FAMOTIDINE 20 MG PO TABS: 20.0000 mg | ORAL_TABLET | Freq: Two times a day (BID) | ORAL | Status: DC | PRN

## 2018-01-10 MED ORDER — ASPIRIN EC 81 MG PO TBEC
81.0000 mg | DELAYED_RELEASE_TABLET | Freq: Every day | ORAL | Status: DC
  Administered 2018-01-11 – 2018-01-13 (×3): 81 mg via ORAL
  Filled 2018-01-10 (×3): qty 1

## 2018-01-10 MED ORDER — DILTIAZEM HCL ER COATED BEADS 120 MG PO CP24: 120.0000 mg | ORAL_CAPSULE | Freq: Every day | ORAL | Status: DC

## 2018-01-10 MED ORDER — ALBUTEROL SULFATE (2.5 MG/3ML) 0.083% IN NEBU
2.5000 mg | INHALATION_SOLUTION | Freq: Two times a day (BID) | RESPIRATORY_TRACT | Status: DC
  Administered 2018-01-11 – 2018-01-13 (×6): 2.5 mg via RESPIRATORY_TRACT
  Filled 2018-01-10 (×6): qty 3

## 2018-01-10 MED ORDER — PREGABALIN 25 MG PO CAPS
50.0000 mg | ORAL_CAPSULE | Freq: Every day | ORAL | Status: DC
  Administered 2018-01-11 – 2018-01-13 (×3): 50 mg via ORAL
  Filled 2018-01-10 (×3): qty 2

## 2018-01-10 MED ORDER — POLYVINYL ALCOHOL 1.4 % OP SOLN
1.0000 [drp] | Freq: Three times a day (TID) | OPHTHALMIC | Status: DC
  Administered 2018-01-11 – 2018-01-13 (×7): 1 [drp] via OPHTHALMIC
  Filled 2018-01-10: qty 15

## 2018-01-10 MED ORDER — WARFARIN - PHARMACIST DOSING INPATIENT
Freq: Every day | Status: DC
  Administered 2018-01-12: 18:00:00

## 2018-01-10 MED ORDER — TAMSULOSIN HCL 0.4 MG PO CAPS
0.4000 mg | ORAL_CAPSULE | Freq: Every day | ORAL | Status: DC
  Administered 2018-01-11 – 2018-01-13 (×3): 0.4 mg via ORAL
  Filled 2018-01-10 (×3): qty 1

## 2018-01-10 MED ORDER — ALBUTEROL SULFATE 1.25 MG/3ML IN NEBU: 1.0000 | INHALATION_SOLUTION | Freq: Two times a day (BID) | RESPIRATORY_TRACT | Status: DC

## 2018-01-10 MED ORDER — METOPROLOL TARTRATE 25 MG PO TABS: 25.0000 mg | ORAL_TABLET | Freq: Two times a day (BID) | ORAL | Status: DC

## 2018-01-10 MED ORDER — MONTELUKAST SODIUM 10 MG PO TABS
10.0000 mg | ORAL_TABLET | Freq: Every day | ORAL | Status: DC
  Administered 2018-01-11 – 2018-01-12 (×2): 10 mg via ORAL
  Filled 2018-01-10 (×3): qty 1

## 2018-01-10 MED ORDER — DILTIAZEM HCL ER COATED BEADS 180 MG PO CP24: 180.0000 mg | ORAL_CAPSULE | Freq: Every day | ORAL | Status: DC

## 2018-01-10 MED ORDER — BUDESONIDE 0.25 MG/2ML IN SUSP
0.2500 mg | Freq: Two times a day (BID) | RESPIRATORY_TRACT | Status: DC
  Administered 2018-01-11 – 2018-01-13 (×6): 0.25 mg via RESPIRATORY_TRACT
  Filled 2018-01-10 (×8): qty 2

## 2018-01-10 MED ORDER — FENTANYL CITRATE (PF) 100 MCG/2ML IJ SOLN
INTRAMUSCULAR | Status: DC | PRN
  Administered 2018-01-10 (×4): 12.5 ug via INTRAVENOUS

## 2018-01-10 MED ORDER — MIDAZOLAM HCL 5 MG/ML IJ SOLN
INTRAMUSCULAR | Status: AC
  Filled 2018-01-10: qty 2

## 2018-01-10 MED ORDER — FLUCONAZOLE 100 MG PO TABS
100.0000 mg | ORAL_TABLET | Freq: Every day | ORAL | Status: DC
  Administered 2018-01-11 – 2018-01-13 (×3): 100 mg via ORAL
  Filled 2018-01-10 (×3): qty 1

## 2018-01-10 MED ORDER — SODIUM CHLORIDE 0.9% FLUSH
3.0000 mL | Freq: Two times a day (BID) | INTRAVENOUS | Status: DC
  Administered 2018-01-11 – 2018-01-13 (×5): 3 mL via INTRAVENOUS

## 2018-01-10 MED ORDER — SODIUM CHLORIDE 0.9 % IV SOLN: INTRAVENOUS | Status: AC

## 2018-01-10 MED ORDER — MIDAZOLAM HCL 10 MG/2ML IJ SOLN
INTRAMUSCULAR | Status: DC | PRN
  Administered 2018-01-10 (×4): 1 mg via INTRAVENOUS

## 2018-01-10 MED ORDER — WARFARIN SODIUM 3 MG PO TABS
3.5000 mg | ORAL_TABLET | Freq: Once | ORAL | Status: AC
  Administered 2018-01-11: 3.5 mg via ORAL
  Filled 2018-01-10: qty 1

## 2018-01-10 MED ORDER — ALLOPURINOL 300 MG PO TABS
300.0000 mg | ORAL_TABLET | Freq: Every day | ORAL | Status: DC
  Administered 2018-01-11 – 2018-01-13 (×3): 300 mg via ORAL
  Filled 2018-01-10 (×3): qty 1

## 2018-01-10 MED ORDER — NITROGLYCERIN 0.4 MG SL SUBL
0.4000 mg | SUBLINGUAL_TABLET | SUBLINGUAL | Status: DC | PRN
Start: 2018-01-10 — End: 2018-01-13
  Administered 2018-01-11: 0.4 mg via SUBLINGUAL
  Filled 2018-01-10: qty 1

## 2018-01-10 MED ORDER — FENTANYL CITRATE (PF) 100 MCG/2ML IJ SOLN
INTRAMUSCULAR | Status: AC
  Filled 2018-01-10: qty 2

## 2018-01-10 MED ORDER — HYDROXYUREA 500 MG PO CAPS: 500.0000 mg | ORAL_CAPSULE | ORAL | Status: DC

## 2018-01-10 MED ORDER — SIMVASTATIN 20 MG PO TABS
10.0000 mg | ORAL_TABLET | Freq: Every evening | ORAL | Status: DC
  Administered 2018-01-11 – 2018-01-12 (×2): 10 mg via ORAL
  Filled 2018-01-10 (×2): qty 1

## 2018-01-10 MED ORDER — AZELASTINE HCL 0.1 % NA SOLN
1.0000 | Freq: Two times a day (BID) | NASAL | Status: DC
  Administered 2018-01-11 – 2018-01-13 (×4): 1 via NASAL
  Filled 2018-01-10: qty 30

## 2018-01-10 NOTE — ED Notes (Signed)
Family at bedside. 

## 2018-01-10 NOTE — ED Notes (Signed)
Patient currently in Endo.

## 2018-01-10 NOTE — ED Notes (Signed)
Pt placed in hospital bed for comfort.

## 2018-01-10 NOTE — ED Notes (Signed)
Dr.Ray at bedside at this time.  

## 2018-01-10 NOTE — H&P (Signed)
Date: 01/10/2018               Patient Name:  Dean [name] MRN: [id number]  DOB: April 22, 1924 Age / Sex: 84 y.o., male   PCP: Angelina Sheriff, MD         Medical Service: Internal Medicine Teaching Service         Attending Physician: Dr. Aldine Contes, MD    First Contact: Dr. Vickki Muff  Pager: [phone]  Second Contact: Dr. Kalman Shan Pager: [phone]       After Hours (After 5p/  First Contact Pager: [phone]  weekends / holidays): Second Contact Pager: [phone]   Chief Complaint: "Food stuck in my throat" and L chest pain  History of Present Illness:  Dean [name] is a 84 yo with PMH of CAD (RCA occlusion on 06/2016 LHC), aortic stenosis s/p TVAR in 2017, pAF on warfarin, tachy-brady syndrome s/p pacemaker (around 2014), HFpEF (08/2016, LVEF = 55-60%, G1DD), HTN, HLD, COPD, CKD stage 3, prostate cancer s/p brachytherapy and history of food impaction requiring EGD for removal who presents with feeling of food being stuck in his throat. History obtained directly from the patient. Patient states he ate beef stew the day prior to presentation and felt like it was stuck in his throat. Since then, he was unable to swallow anything solid and endorsed difficulty drinking water. Patient states as a result of this difficulty swallowing he hasn't eaten or drank anything in the past day. Denies difficulty swallowing and coughing after drinking leading up to this event. Patient states he experienced this same symptom about 3 years ago and he underwent EGD with stretching of his esophagus as a result of food being stuck.   Patient also endorses some ongoing L sided chest pain for the past 2 weeks. Patient states initially CP was associated with diaphoresis and he went to local ED for evaluation. They told him he was "dehydrated" and discharged him from ED. CP occurs at rest and worsens with exertion. Rated as sharp, located around L ribs, and radiates to L flank. Does not worsen  with inspiration. Worsens with palpation of L ribs. Patient has SOB at baseline and denies worsening SOB with this pain. No nausea, vomiting. Episodes of chest pain seem to be increasing in frequency leading up to admission.   Upon arrival to the ED patient was afebrile, nontachycardic, hypertensive to 140-150s/80-90s, and saturating >92% on room air. CBC showed WBC = 7.1, Hgb = 11.6. BMP remarkable for creatinine of 1.3. I-STAT troponin - 0.11. Serum troponin about 1.5 hours later = 0.03, with repeat 2 hours later of 0.03. INR = 2.29. Initial EKG with normal sinus rhythm and stable right bundle branch previously noted on EKG on 10/2011. Repeat EKG showed A-Fib with rate of 134 but no signs of ischemia. GI was consulted on arrival and patient underwent EGD for food bollus removal. EGD showed minor stricture and white plaques with esophagitis in upper 1/3 of esophagus. Cardiology was also consulted and IMTS was called for admission.   Meds:  Current Meds  Medication Sig  . acetaminophen (TYLENOL) 500 MG tablet Take 500 mg by mouth every 6 (six) hours as needed for headache (pain).   Marland Kitchen albuterol (PROVENTIL) (2.5 MG/3ML) 0.083% nebulizer solution Take 2.5 mg by nebulization 2 (two) times daily.  . budesonide (PULMICORT) 0.25 MG/2ML nebulizer solution Take 0.25 mg by nebulization 2 (two) times daily.  . calcitRIOL (ROCALTROL) 0.25 MCG capsule Take 0.25 mcg  by mouth daily.   . Calcium Carbonate Antacid (TUMS PO) Take 2 tablets by mouth daily as needed (heartburn/indigestion).  Marland Kitchen diltiazem (CARDIZEM CD) 120 MG 24 hr capsule Take 120 mg by mouth daily.  . famotidine (PEPCID) 20 MG tablet Take 20 mg by mouth 2 (two) times daily as needed for heartburn or indigestion (gas).  . fluticasone (FLONASE) 50 MCG/ACT nasal spray Place 1 spray into both nostrils 2 (two) times daily.  . furosemide (LASIX) 20 MG tablet Take 20 mg by mouth daily.  Marland Kitchen guaiFENesin (MUCINEX PO) Take 1 tablet by mouth daily.  . hydroxyurea  (HYDREA) 500 MG capsule Take 500 mg by mouth See admin instructions. Take one capsule (500 mg) by mouth every morning, take one capsule (500 mg) every Monday, Wednesday, Friday night. May take with food to minimize GI side effects.  . metoprolol tartrate (LOPRESSOR) 25 MG tablet Take 25 mg by mouth 2 (two) times daily.  . nitroGLYCERIN (NITROSTAT) 0.4 MG SL tablet Place 0.4 mg under the tongue every 5 (five) minutes as needed for chest pain.   Marland Kitchen oxyCODONE-acetaminophen (PERCOCET/ROXICET) 5-325 MG tablet Take 0.5 tablets by mouth daily as needed (neck pain).   . Polyvinyl Alcohol-Povidone (REFRESH OP) Place 1 drop into both eyes daily as needed (dry eyes).  . ranitidine (ZANTAC) 150 MG tablet Take 150 mg by mouth 2 (two) times daily as needed for heartburn (gas).  . Tamsulosin HCl (FLOMAX) 0.4 MG CAPS Take 0.4 mg by mouth at bedtime.   Marland Kitchen warfarin (COUMADIN) 1 MG tablet Take 0.5 mg by mouth See admin instructions. Take 1/2 tablet (0.5 mg) by mouth with a 3 mg tablet daily at bedtime for a total daily dose of 3.5 mg  . warfarin (COUMADIN) 3 MG tablet Take 3 mg by mouth See admin instructions. Take 1 tablet (3 mg) by mouth with 1/2 1 mg tablet daily at bedtime for a total daily dose of 3.5 mg   Allergies: Allergies as of 01/10/2018  . (No Known Allergies)   Past Medical History: Past Medical History:  Diagnosis Date  . Aortic stenosis    a. s/p TAVR in 08/2016; b. followed by [facility]  . Asthma   . CAD (coronary artery disease)    a. [place] 06/2016 with CTO of the RCA otherwise without obstructive CAD  . Environmental allergies   . Essential thrombocytopenia ([place])    followed by Dr Bobby Rumpf (hematology)  . Gout   . Hyperlipidemia   . Hypertension   . Hypoxia, sleep related   . Insomnia   . Neuropathy   . Obesity   . Osteoarthritis   . PAF (paroxysmal atrial fibrillation) ([place])    a. On Coumadin; b. followed by PCP; c. CHADS2VASc => 4 (HTN, age x 2, vascular disease)  . Prostate cancer [facility])     s/p seed implants   Past Surgical History: Past Surgical History:  Procedure Laterality Date  . CHOLECYSTECTOMY    . EYE SURGERY    . POLYPECTOMY    . TESTICLE SURGERY    . TONSILLECTOMY    . TUMOR EXCISION     on back   Family History:  Family History  Problem Relation Age of Onset  . Asthma Maternal Grandmother   . Tuberculosis Maternal Grandmother   . Heart disease Father        died of heart disease  . Alzheimer's disease Mother    Social History:  Patient lives with wife and daughter at his home. Independent  with most ADL's and daughter is primary caretaker. No walking devices or nursing assistance needed in home. 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 Never smoker, tobacco user. NO alcohol or illicit drugs.   Review of Systems: A complete ROS was negative except as per HPI.   Physical Exam: Blood pressure 111/80, pulse (!) 101, temperature 97.7 F (36.5 C), temperature source Oral, resp. rate 17, height 5\' 8"  (1.727 m), weight 171 lb (77.6 kg), SpO2 96 %.  Physical Exam  Constitutional: He is oriented to person, place, and time.  Elderly man laying comfortably in bed non-diaphoretic and in no acute distress.   HENT:  Oropharynx dry without signs of erythema, plaques, or exudate.  Eyes: Pupils are equal, round, and reactive to light. Conjunctivae and EOM are normal.  Cardiovascular:  Tachycardia with irregular rhythm. No murmurs or rubs. 2+ radial and dorsalis pedis pulses.  Respiratory:  Speaking in full sentences. No accessory muscle use or nasal flaring. Mild, diffuse end expiratory wheezing throughout bilateral lung fields. No crackles appreciated.  GI: Soft. Bowel sounds are normal. He exhibits no distension. There is no tenderness. There is no rebound.  Musculoskeletal: He exhibits no edema (of bilateral lower extremities) or tenderness (of bilateral lower extremities).  Lymphadenopathy:    He has no  cervical adenopathy.  Neurological: He is alert and oriented to person, place, and time.  Face strength and sensation intact bilaterally. Tongue midline. Gross motor and sensation to light touch of upper and lower extremities intact bilaterally.  Skin: Skin is warm and dry. No rash noted. No erythema.   EKG: personally reviewed my interpretation is sinus rhythm with old RBBB on admission. No ST elevation or TWI to suggest ischemia. Repeat EKG with a-fib and without signs of ischemai.   Assessment & Plan by Problem: Active Problems:   Chest pain  Dean [name] is a 84 yo with PMH of CAD (RCA occlusion on 06/2016 LHC), aortic stenosis s/p TVAR in 2017, pAF on warfarin, tachy-brady syndrome s/p pacemaker (around 2014), HFpEF (08/2016, LVEF = 55-60%, G1DD), HTN, HLD, COPD, CKD stage 3, prostate cancer s/p brachytherapy who presented with food impaction requiring EGD for removal and atypical chest pain. Patient admitted to the internal medicine teaching service with cardiology and gastroenterology consulting. The specific problems addressed during admission are as follows:  Atypical chest pain: Chest pain has been ongoing for multiple weeks. Onset not associated with exertion, but appears to worsen with movement. Sometimes self resolves within 30 minutes, but otherwise consstant. Gradually increasing in frequency and intensity. Patient endorses one episode of diaphoresis with chest pain in the past 2 weeks, no nausea/vomiting. Pain is reproducible on exam. Patient's CP has both atypical and typical features. Patient's initial troponin mildly elevated to high-normal borderline. Patient without EKG change changes suggestive of ischemia or new arrhyhtmia. Patient has known CAD (RCA occlusion on cath in 2017), HTN, and HLD with HEART score of 4, indicating 12-16% risk of MACE in next 30 days. Given this, will admit for ACS rule out. Given the atypical features, suggest that patient's symptoms more likely a result  of dyspepsia or MSK injury.  -Admit to telemetry -Cardiology consulted, recommendations appreciated -Continue to trend troponin overnight, repeat EKG -Consider starting heparin gtt if troponin significantly increases overnight -F/u echocardiography -Continue home Pecid 20 mg BID for dyspepsia -Consider Voltaren gel for rib pain   Esophageal candidiasis, food impaction s/p EGD on 01/10/2018: Patient's EGD showed mild  stenosis (no dilation 2/2 warfarin use) and esophagitis in upper 1/3 of esophagus. GI will continue to follow up as an outpatient for further evaluation/intervention.  -Fluconazole 100 mg for total 10 days -Encourage PO intake -GI to follow up as outpatient  Paroxysmal Afib on wafarin: Patient arrived in NSR, but reverted to A-Fib upon repeat evaluation. CHADS-VASC = 4. HR in 100-110s during exam, hemodynamically stable. Patient was unable to take home diltiazem today 2/2 dysphagia this AM. Will add back home diltiazem and metoprolol. -Home diltiazem ER 120 mg and metoprolol 25 mg BID -INR =2.26 on admission -Warfarin dosing per pharmacy  Hx of HFpEF (08/2016, LVEF = 55-60%, G1DD): No current signs or symptoms of volume overload on exam. In fact, patient appears clinically dehydrated 2/2 decreased PO intake throughout the day and week leading up to admission. Will hold home lasix given  -Continue to clinically monitor for signs/symptoms after fluid bolus -Restart home lasix 20 mg as needed  COPD: Patient endorses some SOB at rest. Mild end expiratory wheezing on exam, but saturating >92% on room air. Patient has not taken regular medications, which include Pulmicort and Albuterol BID, since yesterday. Will add back home breathing treatments and continue to clinically monitor -Home Pulmicort and albuterol BID  HTN: Patient intermittently normotensive-hypertensive in ED, but currently hemodynamically stable. Patient takes diltiazem, metoprolol, and lasix mg at home.  -Hold home  lasix given dehydration on exam, restart PRN  CKD stage 3: Unclear baseline, since we don't have labs since 2013. Patient appears dry on exam and has experienced decreased PO intake leading up to admission 2/2 dysphagia.  -500 mL NS bolus -Repeat BMP in AM  FEN/GI: -Heart Healthy diet  -540mL NS bolus, replace electrolytes as needed -Pepcid 20 mg BID  VTE Prophylaxis: Warfarin per pharmacy Code Status: Full  Dispo: Admit patient to Inpatient with expected length of stay greater than 2 midnights.  SignedThomasene Ripple, MD 01/10/2018, 10:49 PM  Pager: [phone]

## 2018-01-10 NOTE — Consult Note (Signed)
Consult for Dean [name]  Reason for Consult: Food impaction Referring Physician: ER  Dean [name] HPI: This is a 84 year old male with a PMH of food impaction, asthma, afib, hyperlipidemia, and HTN admitted with a food impaction.  Per his report he had a similar event in the past, approximately 3 years ago, and he was treated by Dr. Odie Sera in [place].  He is not able to tolerate any significant liquids, but he can swallow a small amount of liquid.  The patient ate beef stew last night and since that time he has struggled with his dysphagia.  He has a history of afib and he takes coumadin.  Past Medical History:  Diagnosis Date  . Aortic valve disease    followed by [facility] cardiology  . Asthma   . Environmental allergies   . Essential thrombocytopenia ([place])    followed by Dr Bobby Rumpf (hematology)  . Gout   . Hyperlipidemia   . Hypertension   . Hypoxia, sleep related   . Insomnia   . Neuropathy   . Obesity   . Osteoarthritis   . PAF (paroxysmal atrial fibrillation) ([place])    followed by [place] cardiology  . Prostate cancer [facility])    s/p seed implants    Past Surgical History:  Procedure Laterality Date  . CHOLECYSTECTOMY    . EYE SURGERY    . POLYPECTOMY    . TESTICLE SURGERY    . TONSILLECTOMY    . TUMOR EXCISION     on back    Family History  Problem Relation Age of Onset  . Asthma Maternal Grandmother   . Tuberculosis Maternal Grandmother   . Heart disease Father        died of heart disease  . Alzheimer's disease Mother     Social History:  reports that he has never smoked. He has never used smokeless tobacco. He reports that he does not drink alcohol or use drugs.  Allergies: No Known Allergies  Medications: Scheduled: Continuous:  No results found for this or any previous visit (from the past 24 hour(s)).   No results found.  ROS:  As stated above in the HPI otherwise negative.  Blood pressure (!) 151/86, pulse 75, temperature 98.4 F  (36.9 C), temperature source Oral, resp. rate 20, height 5\' 8"  (1.727 m), weight 77.6 kg (171 lb), SpO2 96 %.    PE: Gen: NAD, Alert and Oriented HEENT:  [place]/AT, EOMI Neck: Supple, no LAD Lungs: CTA Bilaterally CV: RRR without M/G/R ABM: Soft, NTND, +BS Ext: No C/C/E  Assessment/Plan: 1) Food impaction. 2) Afib on coumadin.   The plan will be to disimpact the patient.  Dilation will not be performed as he is on coumadin.  Plan: 1) EGD with disimpaction.  Pape Parson D 01/10/2018, 11:49 AM

## 2018-01-10 NOTE — ED Provider Notes (Addendum)
[facility] EMERGENCY DEPARTMENT Provider Note   CSN: [id number] Arrival date & time: 01/10/18  1014     History   Chief Complaint Chief Complaint  Patient presents with  . Food Impaction    HPI Roy is a 84 y.o. male.  HPI 84 y.o male complaining of food impaction after eating stew beef and unable to swallow food down after eating.States a little water will go down but hasn't tried anything since 0600.  States that he cannot swallow pills.  He has history of one prior episode of similar symptoms that required EGD for bolus removal he has some associated left-sided chest pain that has been present and constant for 72 hours.  Patient presented to [facility] transferred here with report discussed with Drs. Kohut and [place] CXR from [employer]- no cute disease CBC- normal Inr2.0 bmet  creatinine 1.4  EKG from [facility] ED ECG REPORT   Date: 01/10/2018  Rate: 104  Rhythm: atrial fibrillation  QRS Axis: left  Intervals: normal  ST/T Wave abnormalities: nonspecific ST changes  Conduction Disutrbances:right bundle branch block  Narrative Interpretation:   Old EKG Reviewed: none available  I have personally reviewed the EKG tracing and agree with the computerized printout as noted.  Past Medical History:  Diagnosis Date  . Aortic valve disease    followed by [facility] cardiology  . Asthma   . Environmental allergies   . Essential thrombocytopenia    followed by Dr Bobby Rumpf (hematology)  . Gout   . Hyperlipidemia   . Hypertension   . Hypoxia, sleep related   . Insomnia   . Neuropathy   . Obesity   . Osteoarthritis   . PAF (paroxysmal atrial fibrillation)    followed by [place] cardiology  . Prostate cancer    s/p seed implants    Patient Active Problem List   Diagnosis Date Noted  . Physical deconditioning 11/04/2011  . MRSA pneumonia ([place]) 10/16/2011  . Exudative pleural effusion 10/16/2011  . Dyspnea 10/16/2011  .  Ileus ([place]) 10/16/2011  . Constipation 10/16/2011  . Atrial fibrillation with RVR ([place]) 10/16/2011  . Abdominal pain 10/16/2011  . Sepsis(995.91) 10/16/2011  . Obesity 10/16/2011  . GERD (gastroesophageal reflux disease) 10/16/2011  . Hyponatremia 10/16/2011  . Urinary retention 10/16/2011  . Atelectasis 10/16/2011  . PAF (paroxysmal atrial fibrillation) ([place])   . Aortic valve disease   . Prostate cancer ([place])   . Environmental allergies   . Asthma   . Essential thrombocytopenia ([place])   . Neuropathy   . Insomnia   . Gout   . Hypertension   . Hyperlipidemia   . Osteoarthritis   . Hypoxia, sleep related       Home Medications    Prior to Admission medications   Medication Sig Start Date End Date Taking? Authorizing Provider  acetaminophen (TYLENOL) 500 MG tablet Take 500 mg by mouth every other day.    [provider]  albuterol (ACCUNEB) 1.25 MG/3ML nebulizer solution Take 1 ampule by nebulization 2 (two) times daily.    [provider]  allopurinol (ZYLOPRIM) 300 MG tablet Take 300 mg by mouth daily.    [provider]  aspirin EC 81 MG tablet Take 81 mg by mouth daily.    [provider]  azelastine (ASTELIN) 137 MCG/SPRAY nasal spray Place 1 spray into the nose 2 (two) times daily. Use in each nostril as directed    [provider]  budesonide (PULMICORT)  0.25 MG/2ML nebulizer solution Take 0.25 mg by nebulization 2 (two) times daily.    [provider]  diclofenac sodium (VOLTAREN) 1 % GEL Apply 1 application topically daily as needed.    [provider]  diltiazem (CARDIZEM CD) 180 MG 24 hr capsule Take 180 mg by mouth daily.    [provider]  fish oil-omega-3 fatty acids 1000 MG capsule Take 1 g by mouth daily.    [provider]  hydroxyurea (HYDREA) 500 MG capsule Take 500 mg by mouth every other day. May take with food to minimize GI side effects.    [provider]  mometasone  (NASONEX) 50 MCG/ACT nasal spray Place 2 sprays into the nose daily.    [provider]  montelukast (SINGULAIR) 10 MG tablet Take 10 mg by mouth at bedtime.  11/16/11   [provider]  pregabalin (LYRICA) 50 MG capsule Take 50 mg by mouth daily as needed.    [provider]  simvastatin (ZOCOR) 20 MG tablet Take 10 mg by mouth every evening.    [provider]  Tamsulosin HCl (FLOMAX) 0.4 MG CAPS Take 0.4 mg by mouth daily.    [provider]  warfarin (COUMADIN) 5 MG tablet Take 1.5 tablets (7.5 mg total) by mouth one time only at 6 PM. 11/07/11 11/06/12  Love, Ivan Anchors, PA-C  zolpidem (AMBIEN) 5 MG tablet Take 5 mg by mouth at bedtime as needed.    [provider]    Family History Family History  Problem Relation Age of Onset  . Asthma Maternal Grandmother   . Tuberculosis Maternal Grandmother   . Heart disease Father        died of heart disease  . Alzheimer's disease Mother     Social History Social History   Tobacco Use  . Smoking status: Never Smoker  Substance Use Topics  . Alcohol use: Not on file  . Drug use: Not on file     Allergies   Patient has no known allergies.   Review of Systems Review of Systems   Physical Exam Updated Vital Signs There were no vitals taken for this visit.  Physical Exam  Constitutional: He is oriented to person, place, and time. He appears well-developed and well-nourished. No distress.  HENT:  Head: Normocephalic and atraumatic.  Right Ear: External ear normal.  Left Ear: External ear normal.  Eyes: Pupils are equal, round, and reactive to light.  Neck: Normal range of motion.  Cardiovascular: An irregularly irregular rhythm present.  Pulmonary/Chest: Effort normal and breath sounds normal.  Abdominal: Soft. Bowel sounds are normal.  Musculoskeletal: Normal range of motion.  Neurological: He is alert and oriented to person, place, and time.  Skin: Skin is warm and dry.  Capillary refill takes less than 2 seconds.  Psychiatric: He has a normal mood and affect.  Nursing note and vitals reviewed.    ED Treatments / Results  Labs (all labs ordered are listed, but only abnormal results are displayed) Labs Reviewed - No data to display  EKG EKG Interpretation  Date/Time:  Sunday January 10 2018 20:14:21 EDT Ventricular Rate:  134 PR Interval:    QRS Duration: 132 QT Interval:  340 QTC Calculation: 508 R Axis:   2 Text Interpretation:  Atrial fibrillation Right bundle branch block Left ventricular hypertrophy Probable posterior infarct, acute Prolonged QT interval Since last tracing rate faster Confirmed by Noemi Chapel [phone]) on 01/12/2018 8:13:17 AM   Radiology No results  found.  Procedures Procedures (including critical care time)  Medications Ordered in ED Medications - No data to display   Initial Impression / Assessment and Plan / ED Course  I have reviewed the triage vital signs and the nursing notes.  Pertinent labs & imaging results that were available during my care of the patient were reviewed by me and considered in my medical decision making (see chart for details). 1- dysphagia- per verbal report no food impaction.  Patient advised re f/u with gi, discussed with Dr. Benson Norway and he reports that he has discussed this with the daughter and advised to follow-up with their GI as well as given prescription  2- left chest pain-by of left-sided chest pain that has been present intermittently for 2 years but may be worse today.  It is worsening with movement and palpation.  It is in the left lateral rib cage.  I have reviewed evaluation from outside hospital with EKG without acute changes and normal troponin with chest x-Tessah Patchen with no acute changes.  I discussed this with the patient and daughter.  Plan repeat troponin here and EKG.  If negative for acute changes, patient will follow up with primary care doctor Second ekg nsr with rbbb without acute  changes Repeat troponin elevated at 0.11 Discussed with Dr. Curt Bears and cardiology will consult 3 patient also complaining of neck pain.  This again appears to be chronic in nature.  He states he has been seen by his doctor referred to orthopedics in the past.  He is taking Tylenol and occasionally narcotic pain medicine for this.  He does not have any acute changes without neurological deficit.  Discussed with patient and daughter and he will follow-up with primary care.    Discussed with Dr. Benson Norway and he will see and treat patient.  Discussed with Dr. Dorothyann Gibbs and she will follow up with cardiology consult and patient disposition Final Clinical Impressions(s) / ED Diagnoses   Final diagnoses:  Esophageal obstruction due to food impaction  Left-sided chest pain    ED Discharge Orders    None       Pattricia Boss, MD 01/10/18 1056    Pattricia Boss, MD 01/10/18 1631    Pattricia Boss, MD 02/01/18 1404

## 2018-01-10 NOTE — Progress Notes (Signed)
ANTICOAGULATION CONSULT NOTE - Initial Consult  Pharmacy Consult for warfarin Indication: atrial fibrillation  No Known Allergies  Patient Measurements: Height: 5\' 8"  (172.7 cm) Weight: 171 lb (77.6 kg) IBW/kg (Calculated) : 68.4   Vital Signs: Temp: 97.7 F (36.5 C) (04/07 1225) Temp Source: Oral (04/07 1225) BP: 111/80 (04/07 2130) Pulse Rate: 101 (04/07 2130)  Labs: Recent Labs    01/10/18 1629 01/10/18 1827  HGB  --  11.6*  HCT  --  35.5*  PLT  --  751*  LABPROT  --  25.0*  INR  --  2.29  CREATININE  --  1.30*  TROPONINI 0.03* 0.03*    Assessment: 84 yo male on warfarin for AFib. Current INR 2.20, last dose of warfarin was on 4/5.  Goal of Therapy:  INR 2-3 Monitor platelets by anticoagulation protocol: Yes    Plan:  -Warfarin 3.5 mg po x1 -Daily INR   Dean [name] 01/10/2018,11:19 PM

## 2018-01-10 NOTE — Op Note (Signed)
[facility] Patient Name: Dean [name] Procedure Date : 01/10/2018 MRN: [id number] Attending MD: Carol Ada , MD Date of Birth: [birth year] CSN: [id number] Age: 84 Admit Type: Outpatient Procedure:                Upper GI endoscopy Indications:              Dysphagia Providers:                Carol Ada, MD, Zenon Mayo, RN, Elspeth Cho                            Tech., Technician Referring MD:              Medicines:                Midazolam 4 mg IV, Fentanyl 50 micrograms IV Complications:            No immediate complications. Estimated Blood Loss:     Estimated blood loss: none. Procedure:                Pre-Anesthesia Assessment:                           - Prior to the procedure, a History and Physical                            was performed, and patient medications and                            allergies were reviewed. The patient's tolerance of                            previous anesthesia was also reviewed. The risks                            and benefits of the procedure and the sedation                            options and risks were discussed with the patient.                            All questions were answered, and informed consent                            was obtained. Prior Anticoagulants: The patient has                            taken Coumadin (warfarin), last dose was day of                            procedure. ASA Grade Assessment: III - A patient                            with severe systemic disease. After reviewing the  risks and benefits, the patient was deemed in                            satisfactory condition to undergo the procedure.                           - Sedation was administered by an endoscopy nurse.                            The sedation level attained was moderate.                           After obtaining informed consent, the endoscope was                            passed under  direct vision. Throughout the                            procedure, the patient's blood pressure, pulse, and                            oxygen saturations were monitored continuously. The                            [id number]-[id number] ([id number]) scope was introduced through the                            mouth, and advanced to the second part of duodenum.                            The upper GI endoscopy was accomplished without                            difficulty. The patient tolerated the procedure                            well. Scope In: Scope Out: Findings:      One benign-appearing, intrinsic moderate stenosis was found at the       gastroesophageal junction. This stenosis measured 1.3 cm (inner       diameter) x less than one cm (in length). The stenosis was traversed.      A 4 cm hiatal hernia was present.      Localized, white plaques were found in the upper third of the esophagus.      The stomach was normal.      The examined duodenum was normal.      There was no evidence of a food impaction. Impression:               - Benign-appearing esophageal stenosis.                           - 4 cm hiatal hernia.                           - Esophageal plaques were found,  consistent with                            candidiasis.                           - Normal stomach.                           - Normal examined duodenum.                           - No specimens collected. Recommendation:           - Patient has a contact number available for                            emergencies. The signs and symptoms of potential                            delayed complications were discussed with the                            patient. Return to normal activities tomorrow.                            Written discharge instructions were provided to the                            patient.                           - Resume regular diet.                           - Continue present medications.                            - Chew food well.                           Follow up with Dr. Odie Sera for dilation off of                            coumadin.                           Fluconazole 100 mg x 10 days. Procedure Code(s):        --- Professional ---                           [phone], Esophagogastroduodenoscopy, flexible,                            transoral; diagnostic, including collection of                            specimen(s) by brushing or washing, when performed                            (  separate procedure) Diagnosis Code(s):        --- Professional ---                           K22.2, Esophageal obstruction                           K44.9, Diaphragmatic hernia without obstruction or                            gangrene                           K22.9, Disease of esophagus, unspecified                           R13.10, Dysphagia, unspecified CPT copyright 2017 [employer]. All rights reserved. The codes documented in this report are preliminary and upon coder review may  be revised to meet current compliance requirements. Carol Ada, MD Carol Ada, MD 01/10/2018 12:28:50 PM This report has been signed electronically. Number of Addenda: 0

## 2018-01-10 NOTE — Consult Note (Addendum)
Cardiology Consultation:   Patient ID: Dean [name]; [id number]; 12/14/1923   Admit date: 01/10/2018 Date of Consult: 01/10/2018  Primary Care Provider: Angelina Sheriff, MD Primary Cardiologist: Dr. Donnetta Hutching ([place])   Patient Profile:   Dean [name] is a 84 y.o. male with a hx of CAD with known CTO of the RCA by [place] in 06/2016 otherwise with nonobstructive CAD, aortic valve stenosis s/p TAVR 08/2016, PAF on Coumadin, tachy-brady syndrome s/p PPM, dementia, food impaction, HTN, HLD, COPD, asthma, prostate cancer, and arthritis who presented to [facility] with dysphagia and is being seen today for the evaluation of elevated troponin at the request of Dr. Benson Norway.  History of Present Illness:   Dean [name] is followed by Dr. Donnetta Hutching, MD in [place], [place] and was most recently seen by Cardiology in 08/2017 for routine follow up. Only available echo from [place] from 08/2016 showed EF 55-60%, Gr1DD, mild LVH, bioprosthetic AVR well-seated with normal function, mildly dilated ascending aorta, mild MR, normal RVSF, mild TR, mildly dilated RV. Cardiac cath from 06/2016 showed RCA CTO, otherwise nonobstructive CAD with severe AS. RHC showed normal cardiac output and normal PA pressure.   Patient has been having 2-3 week history of left-sided chest discomfort that is not exertional and not associated with any symptoms. He was seen in the [facility] ED 2 weeks ago for this and noted to be "dehydrated." No records on file. He was eating some beef stew the night before and got some stuck in his esophagus.   Patient presented to the ED with food impaction. He ate some beef stew the night prior and since had struggeld with dysphagia. He underwent EGD today by GI that showed benign-appearing esophageal stenosis, hiatal hernia and esophageal plaques consistent with candidiasis. Vital signs remained stable. Initial POC troponin drawn at 15:00 was noted to be 0.11. EKG showed NSR, 63 bpm, RBBB,  rare PVC. His INR is followed by PCP with results not being in Epic.   Paper labs drawn at outside ED showed SCr 1.4, INR 2.0, HGB 12.6. Currently, notes mild left-sided chest discomfort.   Past Medical History:  Diagnosis Date  . Aortic stenosis    a. s/p TAVR in 08/2016; b. followed by [facility]  . Asthma   . CAD (coronary artery disease)    a. [place] 06/2016 with CTO of the RCA otherwise without obstructive CAD  . Environmental allergies   . Essential thrombocytopenia ([place])    followed by Dr Bobby Rumpf (hematology)  . Gout   . Hyperlipidemia   . Hypertension   . Hypoxia, sleep related   . Insomnia   . Neuropathy   . Obesity   . Osteoarthritis   . PAF (paroxysmal atrial fibrillation) ([place])    a. On Coumadin; b. followed by PCP; c. CHADS2VASc => 4 (HTN, age x 2, vascular disease)  . Prostate cancer [facility])    s/p seed implants    Past Surgical History:  Procedure Laterality Date  . CHOLECYSTECTOMY    . EYE SURGERY    . POLYPECTOMY    . TESTICLE SURGERY    . TONSILLECTOMY    . TUMOR EXCISION     on back     Home Meds: Prior to Admission medications   Medication Sig Start Date End Date Taking? Authorizing Provider  acetaminophen (TYLENOL) 500 MG tablet Take 500 mg by mouth every other day.    [provider]  albuterol (ACCUNEB) 1.25 MG/3ML nebulizer solution Take  1 ampule by nebulization 2 (two) times daily.    [provider]  allopurinol (ZYLOPRIM) 300 MG tablet Take 300 mg by mouth daily.    [provider]  aspirin EC 81 MG tablet Take 81 mg by mouth daily.    [provider]  azelastine (ASTELIN) 137 MCG/SPRAY nasal spray Place 1 spray into the nose 2 (two) times daily. Use in each nostril as directed    [provider]  budesonide (PULMICORT) 0.25 MG/2ML nebulizer solution Take 0.25 mg by nebulization 2 (two) times daily.    [provider]  diclofenac sodium (VOLTAREN) 1 % GEL Apply 1 application topically daily as needed.     [provider]  diltiazem (CARDIZEM CD) 180 MG 24 hr capsule Take 180 mg by mouth daily.    [provider]  fish oil-omega-3 fatty acids 1000 MG capsule Take 1 g by mouth daily.    [provider]  hydroxyurea (HYDREA) 500 MG capsule Take 500 mg by mouth every other day. May take with food to minimize GI side effects.    [provider]  mometasone (NASONEX) 50 MCG/ACT nasal spray Place 2 sprays into the nose daily.    [provider]  montelukast (SINGULAIR) 10 MG tablet Take 10 mg by mouth at bedtime.  11/16/11   [provider]  pregabalin (LYRICA) 50 MG capsule Take 50 mg by mouth daily as needed.    [provider]  simvastatin (ZOCOR) 20 MG tablet Take 10 mg by mouth every evening.    [provider]  Tamsulosin HCl (FLOMAX) 0.4 MG CAPS Take 0.4 mg by mouth daily.    [provider]  warfarin (COUMADIN) 5 MG tablet Take 1.5 tablets (7.5 mg total) by mouth one time only at 6 PM. 11/07/11 11/06/12  Love, Ivan Anchors, PA-C  zolpidem (AMBIEN) 5 MG tablet Take 5 mg by mouth at bedtime as needed.    [provider]    Inpatient Medications: Scheduled Meds:  Continuous Infusions:  PRN Meds:   Allergies:  No Known Allergies  Social History:   Social History   Socioeconomic History  . Marital status: Married    Spouse name: Not on file  . Number of children: Not on file  . Years of education: Not on file  . Highest education level: Not on file  Occupational History  . Occupation: retired Dealer    Comment: thinks he had significant aspestos exposure  Social Needs  . Financial resource strain: Not on file  . Food insecurity:    Worry: Not on file    Inability: Not on file  . Transportation needs:    Medical: Not on file    Non-medical: Not on file  Tobacco Use  . Smoking status: Never Smoker  . Smokeless tobacco: Never Used  Substance and Sexual Activity  . Alcohol use: Never     Frequency: Never  . Drug use: Never  . Sexual activity: Not on file  Lifestyle  . Physical activity:    Days per week: Not on file    Minutes per session: Not on file  . Stress: Not on file  Relationships  . Social connections:    Talks on phone: Not on file    Gets together: Not on file    Attends religious service: Not on file    Active member of club or organization: Not on file    Attends meetings of clubs or organizations: Not on file  Relationship status: Not on file  . Intimate partner violence:    Fear of current or ex partner: Not on file    Emotionally abused: Not on file    Physically abused: Not on file    Forced sexual activity: Not on file  Other Topics Concern  . Not on file  Social History Narrative  . Not on file     Family History:  Family History  Problem Relation Age of Onset  . Asthma Maternal Grandmother   . Tuberculosis Maternal Grandmother   . Heart disease Father        died of heart disease  . Alzheimer's disease Mother     ROS:  Review of Systems  Constitutional: Positive for malaise/fatigue. Negative for chills, diaphoresis, fever and weight loss.  HENT: Negative for congestion.   Eyes: Negative for discharge and redness.  Respiratory: Negative for cough, hemoptysis, sputum production, shortness of breath and wheezing.   Cardiovascular: Positive for chest pain. Negative for palpitations, orthopnea, claudication, leg swelling and PND.  Gastrointestinal: Positive for heartburn. Negative for abdominal pain, blood in stool, melena, nausea and vomiting.       Dysphagia   Genitourinary: Negative for hematuria.  Musculoskeletal: Negative for falls and myalgias.  Skin: Negative for rash.  Neurological: Positive for weakness. Negative for dizziness, tingling, tremors, sensory change, speech change, focal weakness and loss of consciousness.  Endo/Heme/Allergies: Does not bruise/bleed easily.  Psychiatric/Behavioral: Negative for substance abuse.  The patient is not nervous/anxious.   All other systems reviewed and are negative.     Physical Exam/Data:   Vitals:   01/10/18 1400 01/10/18 1415 01/10/18 1430 01/10/18 1500  BP: 129/73 (!) 144/74 137/68 (!) 142/76  Pulse: 65 63 62 62  Resp: 17 18 12 14   Temp:      TempSrc:      SpO2: 96% 95% 98% 96%  Weight:      Height:       No intake or output data in the 24 hours ending 01/10/18 1621 Filed Weights   01/10/18 1040  Weight: 171 lb (77.6 kg)   Body mass index is 26 kg/m.   Physical Exam: General: Elderly and frail appearing, in no acute distress. Head: Normocephalic, atraumatic, sclera non-icteric, no xanthomas, nares without discharge.  Neck: Negative for carotid bruits. JVD not elevated. Lungs: Clear bilaterally to auscultation without wheezes, rales, or rhonchi. Breathing is unlabored. Heart: RRR with S1 S2. II/VI systolic murmur RUSB, no rubs, or gallops appreciated. Abdomen: Soft, non-tender, non-distended with normoactive bowel sounds. No hepatomegaly. No rebound/guarding. No obvious abdominal masses. Msk:  Strength and tone appear normal for age. Extremities: No clubbing or cyanosis. No edema. Distal pedal pulses are 2+ and equal bilaterally. Neuro: Alert and oriented X 3. No facial asymmetry. No focal deficit. Moves all extremities spontaneously. Psych:  Responds to questions appropriately with a normal affect.   EKG:  The EKG was personally reviewed and demonstrates: NSR, 63 bpm, RBBB, rare PVC Telemetry:  Telemetry was personally reviewed and demonstrates: NSR  Weights: Filed Weights   01/10/18 1040  Weight: 171 lb (77.6 kg)    Relevant CV Studies: Cardiac cath 06/2016: Diagnostic Procedure Summary 1. Severe aortic stenosis. 2. RCA CTO. Otherwise, nonobstructive CAD. 3. Mildly reduced LV systolic function, EF 54% 4. Inferior and apical hypokinesis, mild. 5. Normal cardiac output. Normal PA pressure. Diagnostic Procedure Recommendations SAVR vs.  TAVR.  TTE 08/2016:  S/p TAVR (29 mm Medtronic CoreValve Evolut Pro) on 08/19/16  Dilated ascending  aorta - mild  Left ventricular hypertrophy - mild  Normal left ventricular systolic function, ejection fraction > 99%  Diastolic dysfunction - grade I (normal filling pressures)  Bioprosthetic aortic valve appears well-seated wtih no significant  regurgitation and normal prosthetic function  Mitral annular calcification  Mitral regurgitation - mild  Normal right ventricular systolic function  Tricuspid regurgitation - mild  Dilated right ventricle - mild  Laboratory Data:  ChemistryNo results for input(s): NA, K, CL, CO2, GLUCOSE, BUN, CREATININE, CALCIUM, GFRNONAA, GFRAA, ANIONGAP in the last 168 hours.  No results for input(s): PROT, ALBUMIN, AST, ALT, ALKPHOS, BILITOT in the last 168 hours. HematologyNo results for input(s): WBC, RBC, HGB, HCT, MCV, MCH, MCHC, RDW, PLT in the last 168 hours. Cardiac EnzymesNo results for input(s): TROPONINI in the last 168 hours.  Recent Labs  Lab 01/10/18 1500  TROPIPOC 0.11*    BNPNo results for input(s): BNP, PROBNP in the last 168 hours.  DDimer No results for input(s): DDIMER in the last 168 hours.  Radiology/Studies:  No results found.  Assessment and Plan:   1. Elevated troponin: -Minimally elevated POC troponin of 0.11 -Reviewed outside ED labs on paper, recommend check cbc, bmet, inr in our system -Cycle troponin until peak -Recommend CXR -Currently with mild left-sided chest discomfort that is reproducible to palpation -Pending routine labs and troponin trend, he may require further evaluation including possible heparin gtt -For now, hold heparin gtt until routine labs are back and troponins are cycled -Check echo -Unless there is dynamic elevation of his troponin or echo demonstrates WMA concerning for ischemia would likely plan for conservative approach   2. CAD as above: -As above  3. PAF: -Currently in  NSR -Coumadin per pharmacy -Recommend checking INR -Continue PTA Cardizem CD for rate control  4. Aortic stenosis s/p TAVR in 08/2016: -Check echo as above  5. HTN: -Improving -Titrate antihypertensives as needed  6. Diastolic dysfunction: -He does not appear grossly volume up at this time -Echo as above   For questions or updates, please contact [place] Please consult [email or web address] for contact info under Cardiology/STEMI.   Signed, Christell Faith, PA-C [place] Pager: [phone] 01/10/2018, 4:21 PM  Attending Note:   The patient was seen and examined.  Agree with assessment and plan as noted above.  Changes made to the above note as needed.  Patient seen and independently examined with Christell Faith, PA .   We discussed all aspects of the encounter. I agree with the assessment and plan as stated above.  1.  Chest pain: The patient presents with a relatively atypical chest pain.  It is very difficult to sort out because he is at least some mild to moderate degree of dementia.  The chest pain is very vague but he does point to the mid / left  chest andleft upper quadrant. These chest pains occur at random.  They do are not necessarily associated with exercise. He says that he is quite active and gets around his yard.  He is never had episodes of chest discomfort with exertion. The chest pain is not associated with eating or drinking.  He does not think that having the food impaction in his esophagus necessarily worsen the problem.  He is not having any pain at this time.  EKG shows normal sinus rhythm.  Right bundle branch block.  At this point I would have a relatively conservative approach to his chest pain.  We will trend troponins.  If the trend is  relatively flat and the echocardiogram shows relatively normal LV function ( or is unchanged from previous echoes ) I would have a conservative approach to his cp He appears to have at least some degree of dementia  -although he seems to be fairly functional according to the family member who is with him tonight.  2.  Paroxysmal atrial fibrillation: He is in normal sinus rhythm at present.  INR is 2.0.  3.  Hypertension: Blood pressure is stable    I have spent a total of 40 minutes with patient reviewing hospital  notes , telemetry, EKGs, labs and examining patient as well as establishing an assessment and plan that was discussed with the patient. > 50% of time was spent in direct patient care.    Thayer Headings, Brooke Bonito., MD, [facility] 01/10/2018, 5:29 PM [address]  [place] Pager [phone]

## 2018-01-10 NOTE — ED Notes (Signed)
I stat troponin results reported to Dr. Jeanell Sparrow by B. Yolanda Bonine, EMT

## 2018-01-11 ENCOUNTER — Encounter (HOSPITAL_COMMUNITY): Payer: Self-pay | Admitting: *Deleted

## 2018-01-11 ENCOUNTER — Other Ambulatory Visit: Payer: Self-pay

## 2018-01-11 ENCOUNTER — Observation Stay (HOSPITAL_BASED_OUTPATIENT_CLINIC_OR_DEPARTMENT_OTHER): Payer: Medicare Other

## 2018-01-11 ENCOUNTER — Observation Stay (HOSPITAL_COMMUNITY): Payer: Medicare Other

## 2018-01-11 DIAGNOSIS — I350 Nonrheumatic aortic (valve) stenosis: Secondary | ICD-10-CM | POA: Diagnosis not present

## 2018-01-11 DIAGNOSIS — I10 Essential (primary) hypertension: Secondary | ICD-10-CM

## 2018-01-11 DIAGNOSIS — B3781 Candidal esophagitis: Secondary | ICD-10-CM | POA: Diagnosis not present

## 2018-01-11 DIAGNOSIS — I340 Nonrheumatic mitral (valve) insufficiency: Secondary | ICD-10-CM

## 2018-01-11 DIAGNOSIS — Z79899 Other long term (current) drug therapy: Secondary | ICD-10-CM

## 2018-01-11 DIAGNOSIS — E785 Hyperlipidemia, unspecified: Secondary | ICD-10-CM

## 2018-01-11 DIAGNOSIS — K222 Esophageal obstruction: Secondary | ICD-10-CM

## 2018-01-11 DIAGNOSIS — I130 Hypertensive heart and chronic kidney disease with heart failure and stage 1 through stage 4 chronic kidney disease, or unspecified chronic kidney disease: Secondary | ICD-10-CM | POA: Diagnosis not present

## 2018-01-11 DIAGNOSIS — N179 Acute kidney failure, unspecified: Secondary | ICD-10-CM | POA: Diagnosis not present

## 2018-01-11 DIAGNOSIS — R0789 Other chest pain: Secondary | ICD-10-CM | POA: Diagnosis not present

## 2018-01-11 DIAGNOSIS — R1013 Epigastric pain: Secondary | ICD-10-CM | POA: Diagnosis not present

## 2018-01-11 DIAGNOSIS — N183 Chronic kidney disease, stage 3 (moderate): Secondary | ICD-10-CM | POA: Diagnosis not present

## 2018-01-11 DIAGNOSIS — Z950 Presence of cardiac pacemaker: Secondary | ICD-10-CM

## 2018-01-11 DIAGNOSIS — D473 Essential (hemorrhagic) thrombocythemia: Secondary | ICD-10-CM

## 2018-01-11 DIAGNOSIS — R1310 Dysphagia, unspecified: Secondary | ICD-10-CM

## 2018-01-11 DIAGNOSIS — I480 Paroxysmal atrial fibrillation: Secondary | ICD-10-CM | POA: Diagnosis not present

## 2018-01-11 DIAGNOSIS — Z8719 Personal history of other diseases of the digestive system: Secondary | ICD-10-CM

## 2018-01-11 DIAGNOSIS — Z7901 Long term (current) use of anticoagulants: Secondary | ICD-10-CM

## 2018-01-11 DIAGNOSIS — I2510 Atherosclerotic heart disease of native coronary artery without angina pectoris: Secondary | ICD-10-CM | POA: Diagnosis not present

## 2018-01-11 DIAGNOSIS — Z952 Presence of prosthetic heart valve: Secondary | ICD-10-CM | POA: Diagnosis not present

## 2018-01-11 DIAGNOSIS — Z8546 Personal history of malignant neoplasm of prostate: Secondary | ICD-10-CM

## 2018-01-11 DIAGNOSIS — I495 Sick sinus syndrome: Secondary | ICD-10-CM

## 2018-01-11 DIAGNOSIS — I4510 Unspecified right bundle-branch block: Secondary | ICD-10-CM

## 2018-01-11 DIAGNOSIS — R079 Chest pain, unspecified: Secondary | ICD-10-CM

## 2018-01-11 DIAGNOSIS — I5030 Unspecified diastolic (congestive) heart failure: Secondary | ICD-10-CM

## 2018-01-11 DIAGNOSIS — J449 Chronic obstructive pulmonary disease, unspecified: Secondary | ICD-10-CM

## 2018-01-11 DIAGNOSIS — Z7951 Long term (current) use of inhaled steroids: Secondary | ICD-10-CM

## 2018-01-11 LAB — ECHOCARDIOGRAM COMPLETE
Height: 68 in
WEIGHTICAEL: 2684.32 [oz_av]

## 2018-01-11 LAB — CBC
HCT: 37.3 % — ABNORMAL LOW (ref 39.0–52.0)
Hemoglobin: 12 g/dL — ABNORMAL LOW (ref 13.0–17.0)
MCH: 35.9 pg — AB (ref 26.0–34.0)
MCHC: 32.2 g/dL (ref 30.0–36.0)
MCV: 111.7 fL — ABNORMAL HIGH (ref 78.0–100.0)
PLATELETS: 772 10*3/uL — AB (ref 150–400)
RBC: 3.34 MIL/uL — ABNORMAL LOW (ref 4.22–5.81)
RDW: 15.2 % (ref 11.5–15.5)
WBC: 10.4 10*3/uL (ref 4.0–10.5)

## 2018-01-11 LAB — BASIC METABOLIC PANEL
Anion gap: 12 (ref 5–15)
BUN: 33 mg/dL — ABNORMAL HIGH (ref 6–20)
CALCIUM: 8.5 mg/dL — AB (ref 8.9–10.3)
CO2: 21 mmol/L — ABNORMAL LOW (ref 22–32)
CREATININE: 1.46 mg/dL — AB (ref 0.61–1.24)
Chloride: 107 mmol/L (ref 101–111)
GFR calc non Af Amer: 40 mL/min — ABNORMAL LOW (ref >60)
GFR, EST AFRICAN AMERICAN: 47 mL/min — AB (ref >60)
Glucose, Bld: 103 mg/dL — ABNORMAL HIGH (ref 65–99)
Potassium: 4.1 mmol/L (ref 3.5–5.1)
Sodium: 140 mmol/L (ref 135–145)

## 2018-01-11 LAB — PROTIME-INR
INR: 2.21
PROTHROMBIN TIME: 24.3 s — AB (ref 11.4–15.2)

## 2018-01-11 LAB — GLUCOSE, CAPILLARY: Glucose-Capillary: 104 mg/dL — ABNORMAL HIGH (ref 65–99)

## 2018-01-11 LAB — TROPONIN I
TROPONIN I: 0.03 ng/mL — AB (ref <0.03)
Troponin I: 0.03 ng/mL (ref <0.03)

## 2018-01-11 LAB — MRSA PCR SCREENING: MRSA by PCR: POSITIVE — AB

## 2018-01-11 MED ORDER — SODIUM CHLORIDE 0.9 % IV SOLN
INTRAVENOUS | Status: AC
  Administered 2018-01-11: 14:00:00 via INTRAVENOUS

## 2018-01-11 MED ORDER — HYDROXYUREA 500 MG PO CAPS
500.0000 mg | ORAL_CAPSULE | Freq: Every day | ORAL | Status: DC
  Administered 2018-01-11 – 2018-01-13 (×3): 500 mg via ORAL
  Filled 2018-01-11 (×3): qty 1

## 2018-01-11 MED ORDER — ACETAMINOPHEN 325 MG PO TABS
650.0000 mg | ORAL_TABLET | Freq: Four times a day (QID) | ORAL | Status: DC | PRN
  Administered 2018-01-11 – 2018-01-12 (×2): 650 mg via ORAL
  Filled 2018-01-11 (×2): qty 2

## 2018-01-11 MED ORDER — POLYETHYLENE GLYCOL 3350 17 G PO PACK: 17.0000 g | PACK | Freq: Every day | ORAL | Status: DC | PRN

## 2018-01-11 MED ORDER — DICLOFENAC SODIUM 1 % TD GEL
2.0000 g | Freq: Four times a day (QID) | TRANSDERMAL | Status: DC | PRN
  Administered 2018-01-11 – 2018-01-13 (×2): 2 g via TOPICAL
  Filled 2018-01-11: qty 100

## 2018-01-11 MED ORDER — SODIUM CHLORIDE 0.9% FLUSH
3.0000 mL | Freq: Two times a day (BID) | INTRAVENOUS | Status: DC
  Administered 2018-01-11 (×2): 3 mL via INTRAVENOUS

## 2018-01-11 MED ORDER — HYDROXYUREA 500 MG PO CAPS
500.0000 mg | ORAL_CAPSULE | ORAL | Status: DC
  Administered 2018-01-11: 500 mg via ORAL
  Filled 2018-01-11: qty 1

## 2018-01-11 MED ORDER — WARFARIN SODIUM 2.5 MG PO TABS
3.5000 mg | ORAL_TABLET | Freq: Once | ORAL | Status: AC
  Administered 2018-01-11: 17:00:00 3.5 mg via ORAL
  Filled 2018-01-11: qty 1

## 2018-01-11 MED ORDER — DILTIAZEM HCL ER COATED BEADS 120 MG PO CP24
120.0000 mg | ORAL_CAPSULE | Freq: Every day | ORAL | Status: DC
  Administered 2018-01-11 – 2018-01-13 (×3): 120 mg via ORAL
  Filled 2018-01-11 (×3): qty 1

## 2018-01-11 MED ORDER — METOPROLOL TARTRATE 25 MG PO TABS
25.0000 mg | ORAL_TABLET | Freq: Two times a day (BID) | ORAL | Status: DC
  Administered 2018-01-11 – 2018-01-13 (×5): 25 mg via ORAL
  Filled 2018-01-11 (×5): qty 1

## 2018-01-11 MED ORDER — HYDROXYUREA 500 MG PO CAPS: 500.0000 mg | ORAL_CAPSULE | ORAL | Status: DC

## 2018-01-11 MED ORDER — ACETAMINOPHEN 650 MG RE SUPP: 650.0000 mg | Freq: Four times a day (QID) | RECTAL | Status: DC | PRN

## 2018-01-11 NOTE — Progress Notes (Signed)
   Subjective: Pt still complaining of some mild left sided chest pain, worse with deep inspiration and palpation.  No diaphoresis, nausea or vomiting  Objective:  Vital signs in last 24 hours: Vitals:   01/11/18 0720 01/11/18 0721 01/11/18 0749 01/11/18 0829  BP:   123/71   Pulse:   (!) 102   Resp:    16  Temp:    (!) 97.5 F (36.4 C)  TempSrc:    Oral  SpO2: 93% 93%  94%  Weight:      Height:       Physical Exam  Constitutional: He is oriented to person, place, and time. He appears well-developed and well-nourished.  Eyes: Right eye exhibits no discharge. Left eye exhibits no discharge. No scleral icterus.  Cardiovascular: Normal heart sounds and intact distal pulses. An irregularly irregular rhythm present. Tachycardia present. Exam reveals no gallop and no friction rub.  No murmur heard. Pulmonary/Chest: Effort normal and breath sounds normal. No respiratory distress. He has no wheezes. He has no rales.  Coarse breath sounds in LLF and LMF  Abdominal: Soft. Bowel sounds are normal. He exhibits no distension and no mass. There is no tenderness. There is no guarding.  Musculoskeletal: He exhibits tenderness (tender to palpation along left chest wall just lateral to mammary line).  Neurological: He is alert and oriented to person, place, and time.  Skin:  No rashes or lesions present along chest, side or abdomen    Assessment/Plan:  Active Problems:   Chest pain  Atypical chest pain: negative ACS workup thus far, non exertional, better with movement in fact, better with change in position, likely musculoskeletal cause   -ECHO pending -ordered 2 view chest x-ray for better evaluation of lungs and ribs -Consider Voltaren gel for rib pain    Esophageal candidiasis, food impaction s/p EGD on 01/10/2018: Patient's EGD showed mild stenosis (no dilation 2/2 warfarin use) and esophagitis in upper 1/3 of esophagus. GI will continue to follow up as an outpatient for further  evaluation/intervention.   -Fluconazole 100 mg for total 10 days -Encourage PO intake -GI to follow up as outpatient   Paroxysmal Afib on wafarin: Patient arrived in NSR, but reverted to A-Fib upon repeat evaluation. CHADS-VASC = 4.   -Home diltiazem ER 120 mg and metoprolol 25 mg BID -INR =2.26 on admission -Warfarin dosing per pharmacy    HFpEF (08/2016, LVEF = 55-60%, G1DD): No current signs or symptoms of volume overload on exam. Holding lasix  -Restart home lasix 20 mg as needed   COPD: Patient endorses some SOB at rest. Mild end expiratory wheezing on exam, but saturating >92% on room air. Patient has not taken regular medications, which include Pulmicort and Albuterol BID, since yesterday. Will add back home breathing treatments and continue to clinically monitor  -Home Pulmicort and albuterol BID -chest x-ray to evaluate for acute process    CKD stage 3: Unclear baseline, since we don't have labs since 2013. Patient appears dry on exam and has experienced decreased PO intake leading up to admission 2/2 dysphagia.   -continue to monitor   Dispo: Anticipated discharge in approximately today vs tomorrow.   Katherine Roan, MD 01/11/2018, 11:54 AM Vickki Muff MD PGY-1 Internal Medicine Pager # [phone]

## 2018-01-11 NOTE — Discharge Instructions (Signed)

## 2018-01-11 NOTE — Progress Notes (Signed)
  Echocardiogram 2D Echocardiogram has been performed.  Johny Chess 01/11/2018, 5:49 PM

## 2018-01-11 NOTE — ED Notes (Signed)
Breakfast tray ordered. Heart healthy.

## 2018-01-11 NOTE — CV Procedure (Signed)
Echocardiogram not completed, patient is off the floor doing other testing.  Darlina Sicilian RDCS

## 2018-01-11 NOTE — Progress Notes (Signed)
Texted 1st pager listed as pt still has chest pain post Nitro x1. Will give next Nitro or pain med or alter course of action if given new orders. Will continue to monitor.   [place]  Lavaeh Bau, RN

## 2018-01-11 NOTE — Progress Notes (Signed)
Winfrey returned page; he will come see pt soon & will give further instructions once pt evaluated; pt states pain "isn't any better" and may be "related to muscles in his neck"; will continue to monitor.   [place]  Caddie Randle, RN

## 2018-01-11 NOTE — Progress Notes (Signed)
Cardiologist:  [facility] and Donnetta Hutching [facility] [place]   Subjective:  Was resting with no complaints when I came into room then started to complain of neck And chest pain   Objective:  Vitals:   01/11/18 0720 01/11/18 0721 01/11/18 0749 01/11/18 0829  BP:   123/71   Pulse:   (!) 102   Resp:    16  Temp:    (!) 97.5 F (36.4 C)  TempSrc:    Oral  SpO2: 93% 93%  94%  Weight:      Height:        Intake/Output from previous day:  Intake/Output Summary (Last 24 hours) at 01/11/2018 1002 Last data filed at 01/10/2018 2323 Gross per 24 hour  Intake -  Output 525 ml  Net -525 ml    Physical Exam: Affect appropriate Elderly white male  HEENT: normal Neck supple with no adenopathy JVP normal no bruits no thyromegaly Lungs clear with no wheezing and good diaphragmatic motion Heart:  S1/S2 SEM  murmur, no rub, gallop or click PMI normal Abdomen: benighn, BS positve, no tenderness, no AAA no bruit.  No HSM or HJR Distal pulses intact with no bruits No edema Neuro non-focal Bruising on both hands  No muscular weakness   Lab Results: Basic Metabolic Panel: Recent Labs    01/10/18 1827 01/11/18 0435  NA 138 140  K 3.9 4.1  CL 106 107  CO2 24 21*  GLUCOSE 97 103*  BUN 25* 33*  CREATININE 1.30* 1.46*  CALCIUM 8.5* 8.5*   Liver Function Tests: No results for input(s): AST, ALT, ALKPHOS, BILITOT, PROT, ALBUMIN in the last 72 hours. No results for input(s): LIPASE, AMYLASE in the last 72 hours. CBC: Recent Labs    01/10/18 1827 01/11/18 0435  WBC 7.1 10.4  HGB 11.6* 12.0*  HCT 35.5* 37.3*  MCV 111.3* 111.7*  PLT 751* 772*   Cardiac Enzymes: Recent Labs    01/10/18 1827 01/11/18 0033 01/11/18 0435  TROPONINI 0.03* 0.03* 0.03*   Thyroid Function Tests: Recent Labs    01/10/18 1703  TSH 1.125   Anemia Panel: No results for input(s): VITAMINB12, FOLATE, FERRITIN, TIBC, IRON, RETICCTPCT in the last 72 hours.  Imaging: No results found.  Cardiac  Studies:  ECG:    Telemetry: 01/11/2018 NSR PaC;s no afib   Echo: pending this admission reviewed 2013 EF 55-60% mild AS mean gradient 15 mmHg  Medications:   . albuterol  2.5 mg Nebulization BID  . allopurinol  300 mg Oral Daily  . aspirin EC  81 mg Oral Daily  . azelastine  1 spray Each Nare BID  . budesonide  0.25 mg Nebulization BID  . diltiazem  120 mg Oral Daily  . fluconazole  100 mg Oral Daily  . hydroxyurea  500 mg Oral Q48H  . metoprolol tartrate  25 mg Oral BID  . montelukast  10 mg Oral QHS  . polyvinyl alcohol  1 drop Both Eyes TID  . pregabalin  50 mg Oral Daily  . simvastatin  10 mg Oral QPM  . sodium chloride flush  3 mL Intravenous Q12H  . sodium chloride flush  3 mL Intravenous Q12H  . tamsulosin  0.4 mg Oral Daily  . warfarin  3.5 mg Oral ONCE-1800  . Warfarin - Pharmacist Dosing Inpatient   Does not apply q1800      Assessment/Plan:   Chest pain : atypical no acute ECG changes 3 troponins negative echo pending No further cardiac  w/u Planned if EF normal and no RWMA;s Known CTO RCA with no left sided disease on cath prior to TAVR 2017 No heparin needed  PAF:  Continue coumadin INR Rx 2.1 on cardizem  HLD: continue statin  PLT:s thrombocytosis on urea f/u oncology  AS:  Post TAVR 2017 no AR murmur SBE prophylaxis echo pending  HTN:  Well controlled.  Continue current medications and low sodium Dash type diet.    Total time spent reviewed chart, labs, x-rays ECGls echo 2013 interviewing patient and exam With decision regarding further w/u [facility] 35 minutes   Jenkins Rouge 01/11/2018, 10:02 AM

## 2018-01-11 NOTE — Progress Notes (Signed)
ANTICOAGULATION CONSULT NOTE - Initial Consult  Pharmacy Consult for warfarin Indication: atrial fibrillation  No Known Allergies  Patient Measurements: Height: 5\' 8"  (172.7 cm) Weight: 167 lb 12.3 oz (76.1 kg) IBW/kg (Calculated) : 68.4   Vital Signs: Temp: 97.8 F (36.6 C) (04/08 0634) Temp Source: Oral (04/08 0634) BP: 123/71 (04/08 0749) Pulse Rate: 102 (04/08 0749)  Labs: Recent Labs    01/10/18 1827 01/11/18 0033 01/11/18 0435  HGB 11.6*  --  12.0*  HCT 35.5*  --  37.3*  PLT 751*  --  772*  LABPROT 25.0*  --  24.3*  INR 2.29  --  2.21  CREATININE 1.30*  --  1.46*  TROPONINI 0.03* 0.03* 0.03*    Assessment: 84 yo male on warfarin 3.5mg  PO daily PTA for AFib. INR was therapeutic on admission. INR today remains therapeutic at 2.21. Hgb 12, plts elevated at 772  Goal of Therapy:  INR 2-3 Monitor platelets by anticoagulation protocol: Yes  Plan:  Give Coumadin 3.5mg  PO x 1 tonight Monitor daily INR, CBC, s/s of bleed  Elenor Quinones, PharmD, [facility] Clinical Pharmacist Pager [phone] 01/11/2018 8:23 AM

## 2018-01-11 NOTE — Progress Notes (Addendum)
Pt still reports same left-sided chest pain since coming up from the ED.   [place]  Eshaan Titzer, RN

## 2018-01-11 NOTE — ED Notes (Signed)
Attempted to call report x 1  

## 2018-01-11 NOTE — ED Notes (Signed)
thge pt wants a breathing treatment

## 2018-01-12 ENCOUNTER — Observation Stay (HOSPITAL_COMMUNITY): Payer: Medicare Other

## 2018-01-12 DIAGNOSIS — Z8249 Family history of ischemic heart disease and other diseases of the circulatory system: Secondary | ICD-10-CM | POA: Diagnosis not present

## 2018-01-12 DIAGNOSIS — I495 Sick sinus syndrome: Secondary | ICD-10-CM | POA: Diagnosis present

## 2018-01-12 DIAGNOSIS — I4510 Unspecified right bundle-branch block: Secondary | ICD-10-CM | POA: Diagnosis present

## 2018-01-12 DIAGNOSIS — N179 Acute kidney failure, unspecified: Secondary | ICD-10-CM | POA: Diagnosis present

## 2018-01-12 DIAGNOSIS — R1310 Dysphagia, unspecified: Secondary | ICD-10-CM | POA: Diagnosis not present

## 2018-01-12 DIAGNOSIS — Z8546 Personal history of malignant neoplasm of prostate: Secondary | ICD-10-CM | POA: Diagnosis not present

## 2018-01-12 DIAGNOSIS — K209 Esophagitis, unspecified: Secondary | ICD-10-CM

## 2018-01-12 DIAGNOSIS — I5030 Unspecified diastolic (congestive) heart failure: Secondary | ICD-10-CM | POA: Diagnosis not present

## 2018-01-12 DIAGNOSIS — Z923 Personal history of irradiation: Secondary | ICD-10-CM | POA: Diagnosis not present

## 2018-01-12 DIAGNOSIS — D693 Immune thrombocytopenic purpura: Secondary | ICD-10-CM | POA: Diagnosis present

## 2018-01-12 DIAGNOSIS — E860 Dehydration: Secondary | ICD-10-CM | POA: Diagnosis present

## 2018-01-12 DIAGNOSIS — F0390 Unspecified dementia without behavioral disturbance: Secondary | ICD-10-CM | POA: Diagnosis present

## 2018-01-12 DIAGNOSIS — I130 Hypertensive heart and chronic kidney disease with heart failure and stage 1 through stage 4 chronic kidney disease, or unspecified chronic kidney disease: Secondary | ICD-10-CM | POA: Diagnosis present

## 2018-01-12 DIAGNOSIS — Z7901 Long term (current) use of anticoagulants: Secondary | ICD-10-CM | POA: Diagnosis not present

## 2018-01-12 DIAGNOSIS — R918 Other nonspecific abnormal finding of lung field: Secondary | ICD-10-CM | POA: Diagnosis not present

## 2018-01-12 DIAGNOSIS — J449 Chronic obstructive pulmonary disease, unspecified: Secondary | ICD-10-CM | POA: Diagnosis not present

## 2018-01-12 DIAGNOSIS — G4700 Insomnia, unspecified: Secondary | ICD-10-CM | POA: Diagnosis present

## 2018-01-12 DIAGNOSIS — K222 Esophageal obstruction: Secondary | ICD-10-CM | POA: Diagnosis not present

## 2018-01-12 DIAGNOSIS — Z950 Presence of cardiac pacemaker: Secondary | ICD-10-CM | POA: Diagnosis not present

## 2018-01-12 DIAGNOSIS — R509 Fever, unspecified: Secondary | ICD-10-CM

## 2018-01-12 DIAGNOSIS — I2510 Atherosclerotic heart disease of native coronary artery without angina pectoris: Secondary | ICD-10-CM | POA: Diagnosis present

## 2018-01-12 DIAGNOSIS — R1013 Epigastric pain: Secondary | ICD-10-CM | POA: Diagnosis not present

## 2018-01-12 DIAGNOSIS — J9811 Atelectasis: Secondary | ICD-10-CM | POA: Diagnosis not present

## 2018-01-12 DIAGNOSIS — R0789 Other chest pain: Secondary | ICD-10-CM | POA: Diagnosis not present

## 2018-01-12 DIAGNOSIS — Z954 Presence of other heart-valve replacement: Secondary | ICD-10-CM | POA: Diagnosis not present

## 2018-01-12 DIAGNOSIS — Z953 Presence of xenogenic heart valve: Secondary | ICD-10-CM | POA: Diagnosis not present

## 2018-01-12 DIAGNOSIS — Z8719 Personal history of other diseases of the digestive system: Secondary | ICD-10-CM | POA: Diagnosis not present

## 2018-01-12 DIAGNOSIS — E785 Hyperlipidemia, unspecified: Secondary | ICD-10-CM | POA: Diagnosis present

## 2018-01-12 DIAGNOSIS — B3781 Candidal esophagitis: Secondary | ICD-10-CM | POA: Diagnosis not present

## 2018-01-12 DIAGNOSIS — G629 Polyneuropathy, unspecified: Secondary | ICD-10-CM | POA: Diagnosis present

## 2018-01-12 DIAGNOSIS — I5032 Chronic diastolic (congestive) heart failure: Secondary | ICD-10-CM | POA: Diagnosis present

## 2018-01-12 DIAGNOSIS — R079 Chest pain, unspecified: Secondary | ICD-10-CM | POA: Diagnosis not present

## 2018-01-12 DIAGNOSIS — I480 Paroxysmal atrial fibrillation: Secondary | ICD-10-CM | POA: Diagnosis not present

## 2018-01-12 DIAGNOSIS — N183 Chronic kidney disease, stage 3 (moderate): Secondary | ICD-10-CM | POA: Diagnosis not present

## 2018-01-12 DIAGNOSIS — R05 Cough: Secondary | ICD-10-CM | POA: Diagnosis not present

## 2018-01-12 DIAGNOSIS — M109 Gout, unspecified: Secondary | ICD-10-CM | POA: Diagnosis present

## 2018-01-12 LAB — CBC
HEMATOCRIT: 34.5 % — AB (ref 39.0–52.0)
Hemoglobin: 10.9 g/dL — ABNORMAL LOW (ref 13.0–17.0)
MCH: 35.6 pg — ABNORMAL HIGH (ref 26.0–34.0)
MCHC: 31.6 g/dL (ref 30.0–36.0)
MCV: 112.7 fL — AB (ref 78.0–100.0)
Platelets: 748 10*3/uL — ABNORMAL HIGH (ref 150–400)
RBC: 3.06 MIL/uL — ABNORMAL LOW (ref 4.22–5.81)
RDW: 15.4 % (ref 11.5–15.5)
WBC: 12.6 10*3/uL — AB (ref 4.0–10.5)

## 2018-01-12 LAB — RESPIRATORY PANEL BY PCR
Adenovirus: NOT DETECTED
BORDETELLA PERTUSSIS-RVPCR: NOT DETECTED
CORONAVIRUS 229E-RVPPCR: NOT DETECTED
CORONAVIRUS HKU1-RVPPCR: NOT DETECTED
Chlamydophila pneumoniae: NOT DETECTED
Coronavirus NL63: NOT DETECTED
Coronavirus OC43: NOT DETECTED
Influenza A: NOT DETECTED
Influenza B: NOT DETECTED
Metapneumovirus: NOT DETECTED
Mycoplasma pneumoniae: NOT DETECTED
Parainfluenza Virus 1: NOT DETECTED
Parainfluenza Virus 2: NOT DETECTED
Parainfluenza Virus 3: NOT DETECTED
Parainfluenza Virus 4: NOT DETECTED
Respiratory Syncytial Virus: NOT DETECTED
Rhinovirus / Enterovirus: NOT DETECTED

## 2018-01-12 LAB — BASIC METABOLIC PANEL
Anion gap: 6 (ref 5–15)
BUN: 33 mg/dL — ABNORMAL HIGH (ref 6–20)
CO2: 23 mmol/L (ref 22–32)
Calcium: 7.9 mg/dL — ABNORMAL LOW (ref 8.9–10.3)
Chloride: 109 mmol/L (ref 101–111)
Creatinine, Ser: 1.65 mg/dL — ABNORMAL HIGH (ref 0.61–1.24)
GFR calc Af Amer: 40 mL/min — ABNORMAL LOW (ref >60)
GFR calc non Af Amer: 35 mL/min — ABNORMAL LOW (ref >60)
Glucose, Bld: 137 mg/dL — ABNORMAL HIGH (ref 65–99)
Potassium: 4.1 mmol/L (ref 3.5–5.1)
Sodium: 138 mmol/L (ref 135–145)

## 2018-01-12 LAB — URINALYSIS, ROUTINE W REFLEX MICROSCOPIC
Bilirubin Urine: NEGATIVE
GLUCOSE, UA: NEGATIVE mg/dL
HGB URINE DIPSTICK: NEGATIVE
KETONES UR: NEGATIVE mg/dL
LEUKOCYTES UA: NEGATIVE
Nitrite: NEGATIVE
PROTEIN: NEGATIVE mg/dL
Specific Gravity, Urine: 1.02 (ref 1.005–1.030)
pH: 5 (ref 5.0–8.0)

## 2018-01-12 LAB — PROTIME-INR
INR: 2.39
Prothrombin Time: 25.9 seconds — ABNORMAL HIGH (ref 11.4–15.2)

## 2018-01-12 LAB — GLUCOSE, CAPILLARY: Glucose-Capillary: 130 mg/dL — ABNORMAL HIGH (ref 65–99)

## 2018-01-12 MED ORDER — WARFARIN SODIUM 2.5 MG PO TABS
3.5000 mg | ORAL_TABLET | Freq: Once | ORAL | Status: AC
  Administered 2018-01-12: 3.5 mg via ORAL
  Filled 2018-01-12: qty 1

## 2018-01-12 MED ORDER — DM-GUAIFENESIN ER 30-600 MG PO TB12
1.0000 | ORAL_TABLET | Freq: Two times a day (BID) | ORAL | Status: DC
  Administered 2018-01-12 – 2018-01-13 (×2): 1 via ORAL
  Filled 2018-01-12 (×2): qty 1

## 2018-01-12 MED ORDER — ALBUTEROL SULFATE (2.5 MG/3ML) 0.083% IN NEBU: 2.5000 mg | INHALATION_SOLUTION | RESPIRATORY_TRACT | Status: DC | PRN

## 2018-01-12 NOTE — Progress Notes (Signed)
Cardiologist:  Nahser and Donnetta Hutching [facility] [place]   Subjective:  No chest pain seems a bit confused this am   Objective:  Vitals:   01/12/18 0100 01/12/18 0451 01/12/18 0500 01/12/18 0700  BP:  102/60  118/63  Pulse:  93  78  Resp:  (!) 21  (!) 21  Temp: 100.1 F (37.8 C) (!) 97.4 F (36.3 C)  98.4 F (36.9 C)  TempSrc: Oral Oral  Oral  SpO2:  97%  91%  Weight:   175 lb 9.6 oz (79.7 kg)   Height:        Intake/Output from previous day:  Intake/Output Summary (Last 24 hours) at 01/12/2018 0847 Last data filed at 01/12/2018 0400 Gross per 24 hour  Intake 746.75 ml  Output 1100 ml  Net -353.25 ml    Physical Exam: Affect appropriate Elderly white male  HEENT: normal Neck supple with no adenopathy JVP normal no bruits no thyromegaly Lungs course rhonchi and congestion  Heart:  S1/S2 SEM  murmur, no rub, gallop or click PMI normal Abdomen: benighn, BS positve, no tenderness, no AAA no bruit.  No HSM or HJR Distal pulses intact with no bruits No edema Neuro non-focal Bruising on both hands  No muscular weakness   Lab Results: Basic Metabolic Panel: Recent Labs    01/11/18 0435 01/12/18 0219  NA 140 138  K 4.1 4.1  CL 107 109  CO2 21* 23  GLUCOSE 103* 137*  BUN 33* 33*  CREATININE 1.46* 1.65*  CALCIUM 8.5* 7.9*   Liver Function Tests: No results for input(s): AST, ALT, ALKPHOS, BILITOT, PROT, ALBUMIN in the last 72 hours. No results for input(s): LIPASE, AMYLASE in the last 72 hours. CBC: Recent Labs    01/11/18 0435 01/12/18 0219  WBC 10.4 12.6*  HGB 12.0* 10.9*  HCT 37.3* 34.5*  MCV 111.7* 112.7*  PLT 772* 748*   Cardiac Enzymes: Recent Labs    01/10/18 1827 01/11/18 0033 01/11/18 0435  TROPONINI 0.03* 0.03* 0.03*   Thyroid Function Tests: Recent Labs    01/10/18 1703  TSH 1.125   Anemia Panel: No results for input(s): VITAMINB12, FOLATE, FERRITIN, TIBC, IRON, RETICCTPCT in the last 72 hours.  Imaging: Dg Chest 2 View  Result  Date: 01/11/2018 CLINICAL DATA:  Chest pain EXAM: CHEST - 2 VIEW COMPARISON:  January 10, 2018 FINDINGS: There is no appreciable edema or consolidation. Heart is upper normal in size with pulmonary vascularity within normal limits. There is aortic atherosclerosis. No adenopathy. Pacemaker lead is attached to the right ventricle. There is an aortic valve replacement. There is degenerative change in the thoracic spine. IMPRESSION: No edema or consolidation. Stable cardiac silhouette. Stable postoperative changes. There is aortic atherosclerosis. Aortic Atherosclerosis (ICD10-I70.0). Electronically Signed   By: Lowella Grip III M.D.   On: 01/11/2018 11:23    Cardiac Studies:  ECG: NSR no acute ST changes    Telemetry: 01/12/2018 NSR PaC;s no afib   Echo:  EF 60-65% TAVR stent normal function mean gradient 8 mmHg  Medications:   . albuterol  2.5 mg Nebulization BID  . allopurinol  300 mg Oral Daily  . aspirin EC  81 mg Oral Daily  . azelastine  1 spray Each Nare BID  . budesonide  0.25 mg Nebulization BID  . diltiazem  120 mg Oral Daily  . fluconazole  100 mg Oral Daily  . hydroxyurea  500 mg Oral Daily   And  . hydroxyurea  500 mg Oral  Once per day on Mon Wed Fri  . metoprolol tartrate  25 mg Oral BID  . montelukast  10 mg Oral QHS  . polyvinyl alcohol  1 drop Both Eyes TID  . pregabalin  50 mg Oral Daily  . simvastatin  10 mg Oral QPM  . sodium chloride flush  3 mL Intravenous Q12H  . sodium chloride flush  3 mL Intravenous Q12H  . tamsulosin  0.4 mg Oral Daily  . warfarin  3.5 mg Oral ONCE-1800  . 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  Continue current medications and low sodium Dash type diet.    Will sign off  Jenkins Rouge 01/12/2018, 8:47 AM

## 2018-01-12 NOTE — Care Management Obs Status (Signed)
[facility] NOTIFICATION   Patient Details  Name: Dean [name] Depasquale MRN: [id number] Date of Birth: 06-Nov-1923   Medicare Observation Status Notification Given:  Yes    Maryclare Labrador, RN 01/12/2018, 2:29 PM

## 2018-01-12 NOTE — Progress Notes (Signed)
Subjective: Pt still complaining of some mild left sided chest pain, worse with deep inspiration, cough (productive of white sputum) and palpation.  No diaphoresis, nausea or vomiting.  Pt with documented fever overnight to 101 pt did not remember feeling febrile, denied any chills.    Objective:  Vital signs in last 24 hours: Vitals:   01/12/18 0700 01/12/18 0920 01/12/18 0921 01/12/18 0931  BP: 118/63     Pulse: 78   (!) 107  Resp: (!) 21     Temp: 98.4 F (36.9 C)     TempSrc: Oral     SpO2: 91% 95% 95%   Weight:      Height:       Physical Exam  Constitutional: He is oriented to person, place, and time. He appears well-developed and well-nourished.  Eyes: Right eye exhibits no discharge. Left eye exhibits no discharge. No scleral icterus.  Cardiovascular: Normal heart sounds and intact distal pulses. An irregularly irregular rhythm present. Tachycardia present. Exam reveals no gallop and no friction rub.  No murmur heard. Pulmonary/Chest: Effort normal and breath sounds normal. No respiratory distress. He has no wheezes. He has no rales.  Coarse breath sounds in LLF and LMF  Abdominal: Soft. Bowel sounds are normal. He exhibits no distension and no mass. There is no tenderness. There is no guarding.  Musculoskeletal: He exhibits tenderness (tender to palpation along left chest wall just lateral to mammary line).  Neurological: He is alert and oriented to person, place, and time.  Skin:  No rashes or lesions present along chest, side or abdomen   CBC Latest Ref Rng & Units 01/12/2018 01/11/2018 01/10/2018  WBC 4.0 - 10.5 K/uL 12.6(H) 10.4 7.1  Hemoglobin 13.0 - 17.0 g/dL 10.9(L) 12.0(L) 11.6(L)  Hematocrit 39.0 - 52.0 % 34.5(L) 37.3(L) 35.5(L)  Platelets 150 - 400 K/uL 748(H) 772(H) 751(H)   Assessment/Plan:  Active Problems:   Chest pain  Atypical chest pain: negative ACS workup thus far, non exertional, better with movement in fact, better with change in position, likely  musculoskeletal cause   -ECHO NRWMA, normal EF, TAVR functioning well -voltaren gel for rib pain   Esophageal candidiasis, food impaction s/p EGD on 01/10/2018: Patient's EGD showed mild stenosis (no dilation 2/2 warfarin use) and esophagitis in upper 1/3 of esophagus. GI will continue to follow up as an outpatient for further evaluation/intervention.   -Fluconazole 100 mg for total 10 days -Encourage PO intake -GI to follow up as outpatient   Paroxysmal Afib on wafarin: Patient arrived in NSR, but reverted to A-Fib upon repeat evaluation. CHADS-VASC = 4.   -Home diltiazem ER 120 mg and metoprolol 25 mg BID -INR =2.26 on admission -Warfarin dosing per pharmacy    HFpEF (08/2016, LVEF = 55-60%, G1DD): No current signs or symptoms of volume overload on exam. Holding lasix  -Restart home lasix 20 mg as needed   COPD: Patient endorses some SOB at rest. Mild end expiratory wheezing on exam, but saturating >92% on room air. Patient has not taken regular medications, which include Pulmicort and Albuterol BID, since yesterday. Will add back home breathing treatments and continue to clinically monitor  -Home Pulmicort and albuterol BID -initial chest x-ray negative but poor inspiration and AP film with rotation  Fever: unknown etiology at this time, pt not symptomatic, could have viral respiratory infection in setting of COPD  -WBC increased from admission -will repeat chest x-ray and get PA film for better evaluation  -will also get UA and  urine culture -RVP pending   CKD stage 3: Unclear baseline, since we don't have labs since 2013. Patient appears dry on exam and has experienced decreased PO intake leading up to admission 2/2 dysphagia.   -continue to monitor   Dispo: Anticipated discharge in approximately tomorrow vs next day pending infectious workup.   Katherine Roan, MD 01/12/2018, 10:52 AM Vickki Muff MD PGY-1 Internal Medicine Pager # [phone]

## 2018-01-12 NOTE — Progress Notes (Signed)
Internal Medicine Attending:   I saw and examined the patient. I reviewed the resident's note and I agree with the resident's findings and plan as documented in the resident's note.  Patient still complains of some mild left-sided chest pain but this is improved since admission.  He was noted to have a documented fever of 101.9 F overnight but denies any subjective chills or rigors.  Patient was initially admitted for left-sided chest pain for ACS rule out.  His troponins remained flat during his hospitalization and his 2D echo showed no signs of acute wall motion abnormality and with a normal EF of 60-65%.  Cardiology follow-up and recommendations appreciated.  No further workup at this time.  Of note, patient did have a temperature up to 101.9 F as well as a slowly increasing white count (now up to 12.6) concerning for an underlying infection.  We will follow-up repeat chest x-ray as well as UA, urine culture and respiratory viral panel.  Will observe patient as an inpatient today and consider discharge home tomorrow if he remains afebrile and his workup is negative.  If he has recurrent fevers would consider checking blood cultures as well.

## 2018-01-12 NOTE — Progress Notes (Signed)
ANTICOAGULATION CONSULT NOTE - Initial Consult  Pharmacy Consult for warfarin Indication: atrial fibrillation  No Known Allergies  Patient Measurements: Height: 5\' 8"  (172.7 cm) Weight: 175 lb 9.6 oz (79.7 kg) IBW/kg (Calculated) : 68.4  Vital Signs: Temp: 98.4 F (36.9 C) (04/09 0700) Temp Source: Oral (04/09 0700) BP: 118/63 (04/09 0700) Pulse Rate: 78 (04/09 0700)  Labs: Recent Labs    01/10/18 1827 01/11/18 0033 01/11/18 0435 01/12/18 0219  HGB 11.6*  --  12.0* 10.9*  HCT 35.5*  --  37.3* 34.5*  PLT 751*  --  772* 748*  LABPROT 25.0*  --  24.3* 25.9*  INR 2.29  --  2.21 2.39  CREATININE 1.30*  --  1.46* 1.65*  TROPONINI 0.03* 0.03* 0.03*  --     Assessment: 84 yo male on warfarin 3.5mg  PO daily PTA for AFib. INR was therapeutic on admission. INR today remains therapeutic at 2.39. Hgb 10.9, plts elevated at 748.  Goal of Therapy:  INR 2-3 Monitor platelets by anticoagulation protocol: Yes  Plan:  Give Coumadin 3.5mg  PO x 1 tonight Monitor daily INR, CBC, s/s of bleed  Elenor Quinones, PharmD, [facility] Clinical Pharmacist Pager [phone] 01/12/2018 8:38 AM

## 2018-01-12 NOTE — Discharge Summary (Addendum)
Name: Dean [name] MRN: [id number] DOB: 01-Oct-1924 84 y.o. PCP: Angelina Sheriff, MD  Date of Admission: 01/10/2018 10:14 AM Date of Discharge: 01/13/2018 Attending Physician: Aldine Contes, MD  Discharge Diagnosis: 1.  Active Problems:   Chest pain   Discharge Medications: Allergies as of 01/13/2018   No Known Allergies     Medication List    STOP taking these medications   famotidine 20 MG tablet Commonly known as:  PEPCID     TAKE these medications   acetaminophen 500 MG tablet Commonly known as:  TYLENOL Take 500 mg by mouth every 6 (six) hours as needed for headache (pain).   albuterol (2.5 MG/3ML) 0.083% nebulizer solution Commonly known as:  PROVENTIL Take 2.5 mg by nebulization 2 (two) times daily.   budesonide 0.25 MG/2ML nebulizer solution Commonly known as:  PULMICORT Take 0.25 mg by nebulization 2 (two) times daily.   calcitRIOL 0.25 MCG capsule Commonly known as:  ROCALTROL Take 0.25 mcg by mouth daily.   dextromethorphan-guaiFENesin 30-600 MG 12hr tablet Commonly known as:  MUCINEX DM Take 1 tablet by mouth 2 (two) times daily.   diclofenac sodium 1 % Gel Commonly known as:  VOLTAREN Apply 2 g topically 4 (four) times daily as needed for up to 14 days (chest wall pain).   diltiazem 120 MG 24 hr capsule Commonly known as:  CARDIZEM CD Take 120 mg by mouth daily.   fluconazole 100 MG tablet Commonly known as:  DIFLUCAN Take 1 tablet (100 mg total) by mouth daily for 6 days. Start taking on:  01/14/2018   fluticasone 50 MCG/ACT nasal spray Commonly known as:  FLONASE Place 1 spray into both nostrils 2 (two) times daily.   furosemide 20 MG tablet Commonly known as:  LASIX Take 20 mg by mouth daily.   hydroxyurea 500 MG capsule Commonly known as:  HYDREA Take 500 mg by mouth See admin instructions. Take one capsule (500 mg) by mouth every morning, take one capsule (500 mg) every Monday, Wednesday, Friday night. May take with  food to minimize GI side effects.   metoprolol tartrate 25 MG tablet Commonly known as:  LOPRESSOR Take 25 mg by mouth 2 (two) times daily.   montelukast 10 MG tablet Commonly known as:  SINGULAIR Take 1 tablet (10 mg total) by mouth at bedtime.   MUCINEX PO Take 1 tablet by mouth daily.   nitroGLYCERIN 0.4 MG SL tablet Commonly known as:  NITROSTAT Place 0.4 mg under the tongue every 5 (five) minutes as needed for chest pain.   oxyCODONE-acetaminophen 5-325 MG tablet Commonly known as:  PERCOCET/ROXICET Take 0.5 tablets by mouth daily as needed (neck pain).   ranitidine 150 MG tablet Commonly known as:  ZANTAC Take 150 mg by mouth 2 (two) times daily as needed for heartburn (gas).   REFRESH OP Place 1 drop into both eyes daily as needed (dry eyes).   tamsulosin 0.4 MG Caps capsule Commonly known as:  FLOMAX Take 0.4 mg by mouth at bedtime.   TUMS PO Take 2 tablets by mouth daily as needed (heartburn/indigestion).   warfarin 1 MG tablet Commonly known as:  COUMADIN Take 0.5 mg by mouth See admin instructions. Take 1/2 tablet (0.5 mg) by mouth with a 3 mg tablet daily at bedtime for a total daily dose of 3.5 mg What changed:  Another medication with the same name was changed. Make sure you understand how and when to take each.   warfarin 3 MG  tablet Commonly known as:  COUMADIN Take 1/2 tablet (1.5 mg) by mouth with 1/2 1 mg tablet daily at bedtime for a total daily dose of 2.5 mg for 6 days while on fluconazole.  After this 6 day course, please take your regular dose of 1 tablet by mouth with 1/2 of 1mg  tablet daily at bedtime for a total daily dose of 3.5mg . What changed:    how much to take  how to take this  when to take this  additional instructions       Disposition and follow-up:   Dean [name] was discharged from [facility] in stable condition.  At the hospital follow up visit please address:  Chest pain  -Continue with  planned CT chest, I believe the answer to the patient's chest pain will be found here  Esophageal candidiasis/Esophageal stricture  -follow up with GI, they would like pt to be off warfarin a few days before performing dilation of stricture -additionally they can follow the candidiasis -pts warfarin dose adjusted for diflucan therapy, check repeat INR and adjust as necessary  2.  Labs / imaging needed at time of follow-up: INR, cbc, bmp  3.  Pending labs/ test needing follow-up: none  Follow-up Appointments: Follow-up Information    Angelina Sheriff, MD Follow up in 1 week(s).   Specialty:  Family Medicine Why:  Please follow up with your PCP to address your continuing chest wall pain and obtain the CT ordered for you.  You will also need close monitoring of your INR while on fluconazole.   Contact information: [place] [id number] [phone]        Carol Ada, MD Follow up in 1 week(s).   Specialty:  Gastroenterology Why:  Please follow up with Dr. Benson Norway or a GI doctor of you and your primary care doctor's choosing for dilation of your esophageal stricture and monitoring of your candidiasis.   Contact information: [address], [address] [id number] [phone]           Hospital Course by problem list: Active Problems:   Chest pain Esophageal candidiasis Esophageal stricture  Patient arrived with chest pain due to his cardiac history he was given a full cardiac workup to rule out acute coronary syndrome.  This returned negative with no elevation in troponins no changes in EKG, no wall motion abnormalities seen on echo and a normally functioning bioprosthetic aortic valve.  This chest pain was found to be reproducible on the left side, it was worse with cough and taking deep breaths.  His chest radiograph showed scarring in the right base with atelectasis, on the left there was also possible atelectasis some volume loss.  He did  have one random fever to 101 but he was completely asymptomatic with no infectious symptoms other than a cough that is chronic for him.  Regardless we did a repeat chest radiograph as the first had some limitations which was negative for signs of pneumonia, we did urine studies that ruled out urinary tract infection.  As the patient was asymptomatic we felt this fever was either due to atelectasis or just a result of inflammation.  His pain was improved somewhat with Voltaren gel.  In discussing his pulmonary history and risk factors including but not limited to asbestos exposure it seems as if our concern for a chest wall irritating process secondary to a pleural/lung abnormality was in line with the patient's PCPs viewpoint as the PCP  had already scheduled a CT chest to further evaluate this.  As this study is scheduled for about a week from now we instructed the patient to continue to work this problem up with his primary care doctor.    Additionally when patient arrived she is complaining of food feeling like it stuck in his throat he has had problems with esophageal stricture in the past and required dilation.  GI was consulted to see him because he was therapeutic on warfarin they deferred dilation at this time however they were able to perform an EGD which showed no food impaction but a stricture.  He was offered follow-up with GI to perform a dilation at a later date and to stick first with softer foods for now.   Discharge Vitals:   BP 108/63 (BP Location: Right Arm)   Pulse 79   Temp 98.5 F (36.9 C) (Oral)   Resp 20   Ht 5\' 8"  (1.727 m)   Wt 176 lb 9.4 oz (80.1 kg)   SpO2 93%   BMI 26.85 kg/m   Pertinent Labs, Studies, and Procedures:   CBC Latest Ref Rng & Units 01/13/2018 01/12/2018 01/11/2018  WBC 4.0 - 10.5 K/uL 10.0 12.6(H) 10.4  Hemoglobin 13.0 - 17.0 g/dL 10.6(L) 10.9(L) 12.0(L)  Hematocrit 39.0 - 52.0 % 33.1(L) 34.5(L) 37.3(L)  Platelets 150 - 400 K/uL 688(H) 748(H) 772(H)    BMP Latest Ref Rng & Units 01/13/2018 01/12/2018 01/11/2018  Glucose 65 - 99 mg/dL 106(H) 137(H) 103(H)  BUN 6 - 20 mg/dL 28(H) 33(H) 33(H)  Creatinine 0.61 - 1.24 mg/dL 1.31(H) 1.65(H) 1.46(H)  Sodium 135 - 145 mmol/L 138 138 140  Potassium 3.5 - 5.1 mmol/L 4.4 4.1 4.1  Chloride 101 - 111 mmol/L 108 109 107  CO2 22 - 32 mmol/L 22 23 21(L)  Calcium 8.9 - 10.3 mg/dL 8.1(L) 7.9(L) 8.5(L)    *RADIOLOGY REPORT*   Clinical Data: Empyema, shortness of breath.  Follow-up.   CHEST - 2 VIEW   Comparison: 11/01/2011   Findings: Small right pleural effusion versus pleural thickening again noted, similar to prior study.  Areas of atelectasis or scarring in the right base, decreased since prior study.  No left effusion currently for focal opacity on the left.  Heart is normal size.  No acute bony abnormality.   IMPRESSION: Small right pleural effusion versus pleural thickening.   Right base scarring or atelectasis.   Findings improved since prior study.  Result status: Final result                              *[facility]*                         [address], [place] [address]                            [phone]   ------------------------------------------------------------------- Transthoracic Echocardiography   Patient:    Dean [name], Dean [name] MR #:       [id number] Study Date: 01/11/2018 Gender:     M Age:  89 Height:     172.7 cm Weight:     76.1 kg BSA:        1.92 m^2 Pt. Status: Room:       2C06C    ATTENDING    Shaune Pollack  SONOGRAPHER  Johny Chess, RDCS, CCT  ORDERING     Rise Mu  PERFORMING   [facility], Inpatient  ADMITTING    Aldine Contes [id number]   cc:   ------------------------------------------------------------------- LV EF: 60% -   65%   ------------------------------------------------------------------- Indications:      Chest pain 786.51.    ------------------------------------------------------------------- History:   Risk factors:  Aortic valve replacement.   ------------------------------------------------------------------- Study Conclusions   - Left ventricle: The cavity size was normal. Wall thickness was   normal. Systolic function was normal. The estimated ejection   fraction was in the range of 60% to 65%. Wall motion was normal;   there were no regional wall motion abnormalities. Doppler   parameters are consistent with abnormal left ventricular   relaxation (grade 1 diastolic dysfunction). Doppler parameters   are consistent with elevated mean left atrial filling pressure. - Aortic valve: A stent-valve (TAVR) bioprosthesis was present and   functioning normally. Valve area (VTI): 1.95 cm^2. - Mitral valve: Calcified annulus. Mildly thickened leaflets .   There was mild regurgitation. - Pulmonary arteries: Systolic pressure was mildly increased. PA   peak pressure: 39 mm Hg (S).   ------------------------------------------------------------------- Study data:  Comparison was made to the study of 10/22/2011.  Study status:  Routine.  Procedure:  The patient&'s pain level was 2 on a scale of 1 to 10. Transthoracic echocardiography. Image quality was adequate.  Study completion:  There were no complications. Transthoracic echocardiography.  M-mode, complete 2D, spectral Doppler, and color Doppler.  Birthdate:  Patient birthdate: Jul 04, 1924.  Age:  Patient is 84 yr old.  Sex:  Gender: male. BMI: 25.5 kg/m^2.  Blood pressure:     132/70  Patient status: Inpatient.  Study date:  Study date: 01/11/2018. Study time: 05:05 PM.  Location:  ICU/CCU   -------------------------------------------------------------------   ------------------------------------------------------------------- Left ventricle:  The cavity size was normal. Wall thickness was normal. Systolic function was normal. The estimated  ejection fraction was in the range of 60% to 65%. Wall motion was normal; there were no regional wall motion abnormalities. Doppler parameters are consistent with abnormal left ventricular relaxation (grade 1 diastolic dysfunction). Doppler parameters are consistent with elevated mean left atrial filling pressure.   ------------------------------------------------------------------- Aortic valve:  A stent-valve (TAVR) bioprosthesis was present and functioning normally.  Doppler:  There was no significant regurgitation. There was no significant perivalvular regurgitation.    VTI ratio of LVOT to aortic valve: 0.86. Valve area (VTI): 1.95 cm^2. Indexed valve area (VTI): 1.01 cm^2/m^2. Peak velocity ratio of LVOT to aortic valve: 0.79. Valve area (Vmax): 1.8 cm^2. Indexed valve area (Vmax): 0.94 cm^2/m^2. Mean velocity ratio of LVOT to aortic valve: 0.78. Valve area (Vmean): 1.77 cm^2. Indexed valve area (Vmean): 0.92 cm^2/m^2.    Mean gradient (S): 8 mm Hg. Peak gradient (S): 15 mm Hg.   ------------------------------------------------------------------- Aorta:  Aortic root: The aortic root was normal in size. Ascending aorta: The ascending aorta was normal in size.   ------------------------------------------------------------------- Mitral valve:   Calcified annulus. Mildly thickened leaflets . Leaflet separation was normal.  Doppler:  Transvalvular velocity was within the normal range. There was no evidence for stenosis. There was mild regurgitation.    Valve area by pressure half-time: 4.28  cm^2. Indexed valve area by pressure half-time: 2.23 cm^2/m^2.    Peak gradient (D): 4 mm Hg.   ------------------------------------------------------------------- Left atrium:  The atrium was at the upper limits of normal in size.   ------------------------------------------------------------------- Right ventricle:  The cavity size was normal. Pacer wire or catheter noted in right  ventricle. Systolic function was normal.   ------------------------------------------------------------------- Pulmonic valve:   Poorly visualized.  The valve appears to be grossly normal.   Cusp separation was normal.  Doppler: Transvalvular velocity was within the normal range. There was trivial regurgitation.   ------------------------------------------------------------------- Tricuspid valve:   Structurally normal valve.   Leaflet separation was normal.  Doppler:  Transvalvular velocity was within the normal range. There was trivial regurgitation.   ------------------------------------------------------------------- Pulmonary artery:   Systolic pressure was mildly increased.   ------------------------------------------------------------------- Right atrium:  The atrium was normal in size.   ------------------------------------------------------------------- Pericardium:  There was no pericardial effusion.   ------------------------------------------------------------------- Systemic veins: Inferior vena cava: The vessel was normal in size. The respirophasic diameter changes were in the normal range (>= 50%), consistent with normal central venous pressure.   ------------------------------------------------------------------- Measurements    Left ventricle                          Value           Reference  LV ID, ED, PLAX chordal                 51     mm       43 - 52  LV ID, ES, PLAX chordal                 36     mm       23 - 38  LV fx shortening, PLAX chordal          29     %        >=29  LV PW thickness, ED                     9      mm       ----------  IVS/LV PW ratio, ED                     0.78            <=1.3  Stroke volume, 2D                       65     ml       ----------  Stroke volume/bsa, 2D                   34     ml/m^2   ----------  LV e&', lateral                          6.47   cm/s     ----------  LV E/e&', lateral                        15.49            ----------  LV e&', medial                           5.88   cm/s     ----------  LV E/e&', medial                         17.05           ----------  LV e&', average                          6.18   cm/s     ----------  LV E/e&', average                        16.23           ----------    Ventricular septum                      Value           Reference  IVS thickness, ED                       7      mm       ----------    LVOT                                    Value           Reference  LVOT ID, S                              17     mm       ----------  LVOT area                               2.27   cm^2     ----------  LVOT peak velocity, S                   153    cm/s     ----------  LVOT mean velocity, S                   100    cm/s     ----------  LVOT VTI, S                             28.5   cm       ----------  LVOT peak gradient, S                   9      mm Hg    ----------    Aortic valve                            Value           Reference  Aortic valve peak velocity, S           193    cm/s     ----------  Aortic valve mean velocity, S           128    cm/s     ----------  Aortic valve VTI, S                     33.2  cm       ----------  Aortic mean gradient, S                 8      mm Hg    ----------  Aortic peak gradient, S                 15     mm Hg    ----------  VTI ratio, LVOT/AV                      0.86            ----------  Aortic valve area, VTI                  1.95   cm^2     ----------  Aortic valve area/bsa, VTI              1.01   cm^2/m^2 ----------  Velocity ratio, peak, LVOT/AV           0.79            ----------  Aortic valve area, peak                 1.8    cm^2     ----------  velocity  Aortic valve area/bsa, peak             0.94   cm^2/m^2 ----------  velocity  Velocity ratio, mean, LVOT/AV           0.78            ----------  Aortic valve area, mean                 1.77   cm^2     ----------  velocity  Aortic valve area/bsa,  mean             0.92   cm^2/m^2 ----------  velocity    Aorta                                   Value           Reference  Aortic root ID, ED                      35     mm       ----------    Left atrium                             Value           Reference  LA ID, A-P, ES                          38     mm       ----------  LA ID/bsa, A-P                          1.98   cm/m^2   <=2.2  LA volume, S                            64.3   ml       ----------  LA volume/bsa, S  33.5   ml/m^2   ----------  LA volume, ES, 1-p A4C                  65.3   ml       ----------  LA volume/bsa, ES, 1-p A4C              34     ml/m^2   ----------  LA volume, ES, 1-p A2C                  52.7   ml       ----------  LA volume/bsa, ES, 1-p A2C              27.4   ml/m^2   ----------    Mitral valve                            Value           Reference  Mitral E-wave peak velocity             100.24 cm/s     ----------  Mitral A-wave peak velocity             148.63 cm/s     ----------  Mitral deceleration slope               556.37 cm/s^2   ----------  Mitral deceleration time                177    ms       150 - 230  Mitral pressure half-time               51     ms       ----------  Mitral peak gradient, D                 4      mm Hg    ----------  Mitral E/A ratio, peak                  0.67            ----------  Mitral valve area, PHT, DP              4.28   cm^2     ----------  Mitral valve area/bsa, PHT, DP          2.23   cm^2/m^2 ----------    Pulmonary arteries                      Value           Reference  PA pressure, S, DP              (H)     39     mm Hg    <=30    Tricuspid valve                         Value           Reference  Tricuspid regurg peak velocity          299    cm/s     ----------  Tricuspid peak RV-RA gradient           36     mm Hg    ----------    Right atrium  Value           Reference  RA ID, S-I, ES, A4C              (H)     56.3   mm       34 - 49  RA area, ES, A4C                        16.4   cm^2     8.3 - 19.5  RA volume, ES, A/L                      39.7   ml       ----------  RA volume/bsa, ES, A/L                  20.7   ml/m^2   ----------    Systemic veins                          Value           Reference  Estimated CVP                           3      mm Hg    ----------    Right ventricle                         Value           Reference  RV pressure, S, DP              (H)     39     mm Hg    <=30   Legend: (L)  and  (H)  mark values outside specified reference range.   ------------------------------------------------------------------- Prepared and Electronically Authenticated by   Sanda Klein, MD 2019-04-08T18:25:48   EGD results One benign-appearing, intrinsic moderate stenosis was found at the gastroesophageal junction. This stenosis measured 1.3 cm (inner diameter) x less than one cm (in length). The stenosis was traversed. Findings: A 4 cm hiatal hernia was present. Localized, white plaques were found in the upper third of the esophagus. The stomach was normal. The examined duodenum was normal. There was no evidence of a food impaction. - Benign-appearing esophageal stenosis.  Discharge Instructions: Discharge Instructions    Diet - low sodium heart healthy   Complete by:  As directed    Discharge instructions   Complete by:  As directed    Dean [name], your chest pain does not appear to be related to any problems with your heart.  Your cardiac enzymes did not elevate, your electrocardiogram was not indicative of acute coronary syndrome and the character of your pain is not typical for cardiac chest pain.  We believe your chest pain to be musculoskeletal in nature.  Given your history of asbestos exposure and nodules seen on imaging one of which is close to the area of pain in your chest wall, it will be important to follow this up with your primary care doctor.  I am  encouraged by the fact you told me he has already scheduled a CT scan to evaluate this further, it will be very important to follow up with Dr. Lin Landsman and get this done.  I will give you a short supply of voltaren gel, please use this when you  need it up to 4 times per day.  For your esophageal candidiasis and stricture please follow up with your GI physician to address this further.  Call and speak with them directly because they may want you to stop taking your warfarin before the procedure.  We are treating you esophageal infection with fluconazole.  This drug can effect your warfarin dosing.  Please take the lower dose 2.5mg  daily prescribed for about 6 days and follow up with your PCP closely to check your INR.   Increase activity slowly   Complete by:  As directed       Signed: Katherine Roan, MD 01/13/2018, 1:27 PM

## 2018-01-13 DIAGNOSIS — Z77090 Contact with and (suspected) exposure to asbestos: Secondary | ICD-10-CM

## 2018-01-13 DIAGNOSIS — J9811 Atelectasis: Secondary | ICD-10-CM

## 2018-01-13 DIAGNOSIS — Z954 Presence of other heart-valve replacement: Secondary | ICD-10-CM

## 2018-01-13 DIAGNOSIS — R918 Other nonspecific abnormal finding of lung field: Secondary | ICD-10-CM

## 2018-01-13 LAB — BASIC METABOLIC PANEL
Anion gap: 8 (ref 5–15)
BUN: 28 mg/dL — ABNORMAL HIGH (ref 6–20)
CALCIUM: 8.1 mg/dL — AB (ref 8.9–10.3)
CHLORIDE: 108 mmol/L (ref 101–111)
CO2: 22 mmol/L (ref 22–32)
CREATININE: 1.31 mg/dL — AB (ref 0.61–1.24)
GFR calc Af Amer: 53 mL/min — ABNORMAL LOW (ref >60)
GFR calc non Af Amer: 46 mL/min — ABNORMAL LOW (ref >60)
GLUCOSE: 106 mg/dL — AB (ref 65–99)
Potassium: 4.4 mmol/L (ref 3.5–5.1)
Sodium: 138 mmol/L (ref 135–145)

## 2018-01-13 LAB — CBC
HCT: 33.1 % — ABNORMAL LOW (ref 39.0–52.0)
HEMOGLOBIN: 10.6 g/dL — AB (ref 13.0–17.0)
MCH: 35.7 pg — AB (ref 26.0–34.0)
MCHC: 32 g/dL (ref 30.0–36.0)
MCV: 111.4 fL — AB (ref 78.0–100.0)
Platelets: 688 10*3/uL — ABNORMAL HIGH (ref 150–400)
RBC: 2.97 MIL/uL — ABNORMAL LOW (ref 4.22–5.81)
RDW: 15.1 % (ref 11.5–15.5)
WBC: 10 10*3/uL (ref 4.0–10.5)

## 2018-01-13 LAB — GLUCOSE, CAPILLARY: GLUCOSE-CAPILLARY: 112 mg/dL — AB (ref 65–99)

## 2018-01-13 LAB — PROTIME-INR
INR: 2.49
PROTHROMBIN TIME: 26.7 s — AB (ref 11.4–15.2)

## 2018-01-13 MED ORDER — DICLOFENAC SODIUM 1 % TD GEL: 2.0000 g | Freq: Four times a day (QID) | TRANSDERMAL | 0 refills | Status: AC | PRN

## 2018-01-13 MED ORDER — FLUCONAZOLE 100 MG PO TABS: 100.0000 mg | ORAL_TABLET | Freq: Every day | ORAL | 0 refills | Status: AC

## 2018-01-13 MED ORDER — MUPIROCIN 2 % EX OINT
1.0000 "application " | TOPICAL_OINTMENT | Freq: Two times a day (BID) | CUTANEOUS | Status: DC
  Administered 2018-01-13: 1 via NASAL
  Filled 2018-01-13: qty 22

## 2018-01-13 MED ORDER — WARFARIN SODIUM 3 MG PO TABS: ORAL_TABLET | ORAL | Status: AC

## 2018-01-13 MED ORDER — MONTELUKAST SODIUM 10 MG PO TABS: 10.0000 mg | ORAL_TABLET | Freq: Every day | ORAL | 0 refills | Status: AC

## 2018-01-13 MED ORDER — WARFARIN SODIUM 2.5 MG PO TABS: 2.5000 mg | ORAL_TABLET | Freq: Once | ORAL | Status: DC

## 2018-01-13 MED ORDER — DM-GUAIFENESIN ER 30-600 MG PO TB12: 1.0000 | ORAL_TABLET | Freq: Two times a day (BID) | ORAL | 0 refills | Status: DC

## 2018-01-13 MED ORDER — CHLORHEXIDINE GLUCONATE CLOTH 2 % EX PADS
6.0000 | MEDICATED_PAD | Freq: Every day | CUTANEOUS | Status: DC
  Administered 2018-01-13: 6 via TOPICAL

## 2018-01-13 NOTE — Progress Notes (Signed)
Discharge note. Patient and daughter educated at bedside. RN educated on follow-up appointments, medications and when to take them, signs and symptoms to look for, diet recommendations, and activity recommendations. PIV removed without complications.   Pt discharged home with family by wheelchair with NT.

## 2018-01-13 NOTE — Consult Note (Signed)
            [facility] CM Primary Care Navigator  01/13/2018  Dean [name] 11/30/1923 [id number]   Went toseepatient at the bedside to identify possible discharge needsbuthe was already discharged home today per staff.  Per MD note, patient was seen for persistent left-sided chest pain and complaint of feeling like something was stuck in his throat after eating beef stew. (EGD-esophagogastroduodenoscopy showed minor stricture and likely candidiasis).  Primary care provider's officeis listed as providingtransition of care (TOC)follow-up.   Patient has discharge instruction to follow-up withprimary care provider and gastroenterology in 1 week.   For additional questions please contact:  Edwena Felty A. Libero Puthoff, BSN, RN-BC [facility] PRIMARY CARE Navigator Cell: [phone]

## 2018-01-13 NOTE — Progress Notes (Signed)
Internal Medicine Attending:   I saw and examined the patient. I reviewed the resident's note and I agree with the resident's findings and plan as documented in the resident's note.  Patient still complains of some mild persistent left-sided chest pain which worsens with deep inspiration but denies any other complaints at this time.  He has remained afebrile over the last 24 hours and his white count is now normalized.  Further workup including repeat chest x-ray, UA and respiratory viral panel have been negative.  I suspect that his left-sided chest pain is musculoskeletal in nature and recommend using Voltaren gel as needed.  No further workup required at this time.  Patient is stable for discharge home today.  We will continue with fluconazole for his esophageal candidiasis and have him follow-up with GI as an outpatient.

## 2018-01-13 NOTE — Progress Notes (Signed)
ANTICOAGULATION CONSULT NOTE  Pharmacy Consult for warfarin Indication: atrial fibrillation  No Known Allergies  Patient Measurements: Height: 5\' 8"  (172.7 cm) Weight: 176 lb 9.4 oz (80.1 kg) IBW/kg (Calculated) : 68.4  Vital Signs: Temp: 97.8 F (36.6 C) (04/10 0822) Temp Source: Oral (04/10 0822) BP: 121/67 (04/10 0822) Pulse Rate: 111 (04/10 0923)  Labs: Recent Labs    01/10/18 1827 01/11/18 0033 01/11/18 0435 01/12/18 0219 01/13/18 0229  HGB 11.6*  --  12.0* 10.9*  --   HCT 35.5*  --  37.3* 34.5*  --   PLT 751*  --  772* 748*  --   LABPROT 25.0*  --  24.3* 25.9* 26.7*  INR 2.29  --  2.21 2.39 2.49  CREATININE 1.30*  --  1.46* 1.65*  --   TROPONINI 0.03* 0.03* 0.03*  --   --     Assessment: 84 yo male on warfarin 3.5mg  PO daily PTA for AFib. INR was therapeutic on admission. INR today remains therapeutic at 2.49 with trend up.  He is noted on fluconazole day 3/10 and an interaction with warfarin is likely.   Goal of Therapy:  INR 2-3 Monitor platelets by anticoagulation protocol: Yes  Plan:  -Coumadin 2.5mg  po today (would estimate a dose reduction on 25-30% while on fluconazole) -Daily PT/INR  Hildred Laser, PharmD Clinical Pharmacist Clinical phone from 8:30-4:00 is [phone] After 4pm, please call Main Rx [phone]) for assistance. 01/13/2018 10:14 AM

## 2018-01-13 NOTE — Progress Notes (Signed)
Subjective: Pt still complaining of some mild left sided chest pain, worse with deep inspiration, no documented fevers overnight, no chills or rigors.  States that voltaren gel works well for the pain but he is afraid to use it due to his kidneys.  We explained systemic absorption is minimal and to stick to using it no more than the prescribed amount per day for a short period should not be an issue.     Objective:  Vital signs in last 24 hours: Vitals:   01/13/18 0712 01/13/18 0728 01/13/18 0822 01/13/18 0923  BP:   121/67   Pulse: 78  85 (!) 111  Resp: 20  16   Temp:   97.8 F (36.6 C)   TempSrc:   Oral   SpO2: 91% 90% 95%   Weight:      Height:       Physical Exam  Constitutional: He is oriented to person, place, and time. He appears well-developed and well-nourished.  Eyes: Right eye exhibits no discharge. Left eye exhibits no discharge. No scleral icterus.  Cardiovascular: Normal heart sounds and intact distal pulses. An irregularly irregular rhythm present. Tachycardia present. Exam reveals no gallop and no friction rub.  No murmur heard. Pulmonary/Chest: Effort normal and breath sounds normal. No respiratory distress. He has no wheezes. He has no rales.  Coarse breath sounds in LLF and LMF  Abdominal: Soft. Bowel sounds are normal. He exhibits no distension and no mass. There is no tenderness. There is no guarding.  Musculoskeletal: He exhibits tenderness (tender to palpation along left chest wall just lateral to mammary line).  Neurological: He is alert and oriented to person, place, and time.  Skin:  No rashes or lesions present along chest, side or abdomen   CBC Latest Ref Rng & Units 01/12/2018 01/11/2018 01/10/2018  WBC 4.0 - 10.5 K/uL 12.6(H) 10.4 7.1  Hemoglobin 13.0 - 17.0 g/dL 10.9(L) 12.0(L) 11.6(L)  Hematocrit 39.0 - 52.0 % 34.5(L) 37.3(L) 35.5(L)  Platelets 150 - 400 K/uL 748(H) 772(H) 751(H)   BMP Latest Ref Rng & Units 01/12/2018 01/11/2018 01/10/2018  Glucose 65 -  99 mg/dL 137(H) 103(H) 97  BUN 6 - 20 mg/dL 33(H) 33(H) 25(H)  Creatinine 0.61 - 1.24 mg/dL 1.65(H) 1.46(H) 1.30(H)  Sodium 135 - 145 mmol/L 138 140 138  Potassium 3.5 - 5.1 mmol/L 4.1 4.1 3.9  Chloride 101 - 111 mmol/L 109 107 106  CO2 22 - 32 mmol/L 23 21(L) 24  Calcium 8.9 - 10.3 mg/dL 7.9(L) 8.5(L) 8.5(L)   Assessment/Plan:  Active Problems:   Chest pain  Atypical chest pain: negative ACS workup thus far, non exertional, better with movement i, better with change in position, likely musculoskeletal cause vs lung/pleural disease  -ECHO NRWMA, normal EF, TAVR functioning well -voltaren gel for rib pain   Esophageal candidiasis, food impaction s/p EGD on 01/10/2018: Patient's EGD showed mild stenosis (no dilation 2/2 warfarin use) and esophagitis in upper 1/3 of esophagus. GI will continue to follow up as an outpatient for further evaluation/intervention.   -Fluconazole 100 mg for total 10 days -Encourage PO intake -GI to follow up as outpatient   Paroxysmal Afib on wafarin: Patient arrived in NSR, but reverted to A-Fib upon repeat evaluation. CHADS-VASC = 4.   -Home diltiazem ER 120 mg and metoprolol 25 mg BID -INR =2.26 on admission -Warfarin dosing per pharmacy    HFpEF (08/2016, LVEF = 55-60%, G1DD): No current signs or symptoms of volume overload on exam. Holding lasix  -  Restart home lasix 20 mg as needed   COPD: Patient endorses some SOB at rest. Mild end expiratory wheezing on exam, but saturating >92% on room air. Patient has not taken regular medications, which include Pulmicort and Albuterol BID, since yesterday. Will add back home breathing treatments and continue to clinically monitor  -Home Pulmicort and albuterol BID -initial chest x-ray negative but poor inspiration and AP film with rotation -repeat chest x-ray with no evidence of infection does demonstrate scarring and volume loss R>L  Fever: unknown etiology at this time, pt not symptomatic, could have viral  respiratory infection in setting of COPD  -WBC increased from admission CBC for today pending -repeat chest film a much better study with no signs of infection -UA negative for infection culture pending -RVP negative   CKD stage 3: Unclear baseline, since we don't have labs since 2013. Patient appears dry on exam and has experienced decreased PO intake leading up to admission 2/2 dysphagia.   -continue to monitor   Dispo: Anticipated discharge later today as pt medically stable  Katherine Roan, MD 01/13/2018, 11:10 AM Vickki Muff MD PGY-1 Internal Medicine Pager # [phone]

## 2018-01-14 LAB — URINE CULTURE

## 2018-01-25 DIAGNOSIS — Z9181 History of falling: Secondary | ICD-10-CM | POA: Diagnosis not present

## 2018-01-25 DIAGNOSIS — Z79899 Other long term (current) drug therapy: Secondary | ICD-10-CM | POA: Diagnosis not present

## 2018-01-25 DIAGNOSIS — Z1331 Encounter for screening for depression: Secondary | ICD-10-CM | POA: Diagnosis not present

## 2018-01-25 DIAGNOSIS — N189 Chronic kidney disease, unspecified: Secondary | ICD-10-CM | POA: Diagnosis not present

## 2018-01-25 DIAGNOSIS — I10 Essential (primary) hypertension: Secondary | ICD-10-CM | POA: Diagnosis not present

## 2018-01-25 DIAGNOSIS — R252 Cramp and spasm: Secondary | ICD-10-CM | POA: Diagnosis not present

## 2018-01-25 DIAGNOSIS — M109 Gout, unspecified: Secondary | ICD-10-CM | POA: Diagnosis not present

## 2018-01-25 DIAGNOSIS — D519 Vitamin B12 deficiency anemia, unspecified: Secondary | ICD-10-CM | POA: Diagnosis not present

## 2018-01-25 DIAGNOSIS — Z7901 Long term (current) use of anticoagulants: Secondary | ICD-10-CM | POA: Diagnosis not present

## 2018-01-25 DIAGNOSIS — Z1339 Encounter for screening examination for other mental health and behavioral disorders: Secondary | ICD-10-CM | POA: Diagnosis not present

## 2018-01-25 DIAGNOSIS — Z0000 Encounter for general adult medical examination without abnormal findings: Secondary | ICD-10-CM | POA: Diagnosis not present

## 2018-01-25 DIAGNOSIS — E559 Vitamin D deficiency, unspecified: Secondary | ICD-10-CM | POA: Diagnosis not present

## 2018-01-27 DIAGNOSIS — K222 Esophageal obstruction: Secondary | ICD-10-CM | POA: Diagnosis not present

## 2018-01-27 DIAGNOSIS — K5790 Diverticulosis of intestine, part unspecified, without perforation or abscess without bleeding: Secondary | ICD-10-CM | POA: Diagnosis not present

## 2018-01-29 DIAGNOSIS — Z7901 Long term (current) use of anticoagulants: Secondary | ICD-10-CM | POA: Diagnosis not present

## 2018-02-04 DIAGNOSIS — Z7901 Long term (current) use of anticoagulants: Secondary | ICD-10-CM | POA: Diagnosis not present

## 2018-02-08 DIAGNOSIS — Z950 Presence of cardiac pacemaker: Secondary | ICD-10-CM | POA: Diagnosis not present

## 2018-02-09 DIAGNOSIS — R0789 Other chest pain: Secondary | ICD-10-CM | POA: Diagnosis not present

## 2018-02-09 DIAGNOSIS — Z6826 Body mass index (BMI) 26.0-26.9, adult: Secondary | ICD-10-CM | POA: Diagnosis not present

## 2018-02-09 DIAGNOSIS — R140 Abdominal distension (gaseous): Secondary | ICD-10-CM | POA: Diagnosis not present

## 2018-02-09 DIAGNOSIS — K5900 Constipation, unspecified: Secondary | ICD-10-CM | POA: Diagnosis not present

## 2018-02-15 DIAGNOSIS — Z7901 Long term (current) use of anticoagulants: Secondary | ICD-10-CM | POA: Diagnosis not present

## 2018-02-16 DIAGNOSIS — I10 Essential (primary) hypertension: Secondary | ICD-10-CM | POA: Diagnosis not present

## 2018-02-16 DIAGNOSIS — K222 Esophageal obstruction: Secondary | ICD-10-CM | POA: Diagnosis not present

## 2018-02-16 DIAGNOSIS — Z8711 Personal history of peptic ulcer disease: Secondary | ICD-10-CM | POA: Diagnosis not present

## 2018-02-16 DIAGNOSIS — J45909 Unspecified asthma, uncomplicated: Secondary | ICD-10-CM | POA: Diagnosis not present

## 2018-02-16 DIAGNOSIS — R110 Nausea: Secondary | ICD-10-CM | POA: Diagnosis not present

## 2018-02-16 DIAGNOSIS — K219 Gastro-esophageal reflux disease without esophagitis: Secondary | ICD-10-CM | POA: Diagnosis not present

## 2018-02-16 DIAGNOSIS — Z79899 Other long term (current) drug therapy: Secondary | ICD-10-CM | POA: Diagnosis not present

## 2018-02-16 DIAGNOSIS — K449 Diaphragmatic hernia without obstruction or gangrene: Secondary | ICD-10-CM | POA: Diagnosis not present

## 2018-02-16 DIAGNOSIS — I519 Heart disease, unspecified: Secondary | ICD-10-CM | POA: Diagnosis not present

## 2018-02-16 DIAGNOSIS — R1310 Dysphagia, unspecified: Secondary | ICD-10-CM | POA: Diagnosis not present

## 2018-02-16 DIAGNOSIS — Z9049 Acquired absence of other specified parts of digestive tract: Secondary | ICD-10-CM | POA: Diagnosis not present

## 2018-03-11 DIAGNOSIS — Z7901 Long term (current) use of anticoagulants: Secondary | ICD-10-CM | POA: Diagnosis not present

## 2018-03-12 DIAGNOSIS — C61 Malignant neoplasm of prostate: Secondary | ICD-10-CM | POA: Diagnosis not present

## 2018-03-29 DIAGNOSIS — M109 Gout, unspecified: Secondary | ICD-10-CM | POA: Diagnosis not present

## 2018-03-29 DIAGNOSIS — N183 Chronic kidney disease, stage 3 (moderate): Secondary | ICD-10-CM | POA: Diagnosis not present

## 2018-03-29 DIAGNOSIS — Z7901 Long term (current) use of anticoagulants: Secondary | ICD-10-CM | POA: Diagnosis not present

## 2018-03-29 DIAGNOSIS — N189 Chronic kidney disease, unspecified: Secondary | ICD-10-CM | POA: Diagnosis not present

## 2018-03-29 DIAGNOSIS — D631 Anemia in chronic kidney disease: Secondary | ICD-10-CM | POA: Diagnosis not present

## 2018-03-29 DIAGNOSIS — Z952 Presence of prosthetic heart valve: Secondary | ICD-10-CM | POA: Diagnosis not present

## 2018-03-29 DIAGNOSIS — I259 Chronic ischemic heart disease, unspecified: Secondary | ICD-10-CM | POA: Diagnosis not present

## 2018-03-29 DIAGNOSIS — N2581 Secondary hyperparathyroidism of renal origin: Secondary | ICD-10-CM | POA: Diagnosis not present

## 2018-03-29 DIAGNOSIS — I129 Hypertensive chronic kidney disease with stage 1 through stage 4 chronic kidney disease, or unspecified chronic kidney disease: Secondary | ICD-10-CM | POA: Diagnosis not present

## 2018-03-29 DIAGNOSIS — I509 Heart failure, unspecified: Secondary | ICD-10-CM | POA: Diagnosis not present

## 2018-03-29 DIAGNOSIS — I639 Cerebral infarction, unspecified: Secondary | ICD-10-CM | POA: Diagnosis not present

## 2018-03-29 DIAGNOSIS — I498 Other specified cardiac arrhythmias: Secondary | ICD-10-CM | POA: Diagnosis not present

## 2018-03-29 DIAGNOSIS — E875 Hyperkalemia: Secondary | ICD-10-CM | POA: Diagnosis not present

## 2018-03-31 DIAGNOSIS — I951 Orthostatic hypotension: Secondary | ICD-10-CM | POA: Diagnosis not present

## 2018-03-31 DIAGNOSIS — I4891 Unspecified atrial fibrillation: Secondary | ICD-10-CM | POA: Diagnosis not present

## 2018-03-31 DIAGNOSIS — N189 Chronic kidney disease, unspecified: Secondary | ICD-10-CM | POA: Diagnosis not present

## 2018-03-31 DIAGNOSIS — Z6826 Body mass index (BMI) 26.0-26.9, adult: Secondary | ICD-10-CM | POA: Diagnosis not present

## 2018-04-01 DIAGNOSIS — R110 Nausea: Secondary | ICD-10-CM | POA: Diagnosis not present

## 2018-04-01 DIAGNOSIS — K219 Gastro-esophageal reflux disease without esophagitis: Secondary | ICD-10-CM | POA: Diagnosis not present

## 2018-04-02 DIAGNOSIS — N183 Chronic kidney disease, stage 3 (moderate): Secondary | ICD-10-CM | POA: Diagnosis not present

## 2018-04-07 DIAGNOSIS — M7540 Impingement syndrome of unspecified shoulder: Secondary | ICD-10-CM | POA: Diagnosis not present

## 2018-04-12 DIAGNOSIS — D473 Essential (hemorrhagic) thrombocythemia: Secondary | ICD-10-CM | POA: Diagnosis not present

## 2018-04-12 DIAGNOSIS — Z7901 Long term (current) use of anticoagulants: Secondary | ICD-10-CM | POA: Diagnosis not present

## 2018-04-12 DIAGNOSIS — Z7982 Long term (current) use of aspirin: Secondary | ICD-10-CM | POA: Diagnosis not present

## 2018-04-20 DIAGNOSIS — M542 Cervicalgia: Secondary | ICD-10-CM | POA: Diagnosis not present

## 2018-04-20 DIAGNOSIS — M25612 Stiffness of left shoulder, not elsewhere classified: Secondary | ICD-10-CM | POA: Diagnosis not present

## 2018-04-20 DIAGNOSIS — N183 Chronic kidney disease, stage 3 (moderate): Secondary | ICD-10-CM | POA: Diagnosis not present

## 2018-04-20 DIAGNOSIS — M6281 Muscle weakness (generalized): Secondary | ICD-10-CM | POA: Diagnosis not present

## 2018-04-20 DIAGNOSIS — M25519 Pain in unspecified shoulder: Secondary | ICD-10-CM | POA: Diagnosis not present

## 2018-04-20 DIAGNOSIS — R293 Abnormal posture: Secondary | ICD-10-CM | POA: Diagnosis not present

## 2018-04-20 DIAGNOSIS — M25611 Stiffness of right shoulder, not elsewhere classified: Secondary | ICD-10-CM | POA: Diagnosis not present

## 2018-04-20 DIAGNOSIS — M25511 Pain in right shoulder: Secondary | ICD-10-CM | POA: Diagnosis not present

## 2018-04-20 DIAGNOSIS — M2560 Stiffness of unspecified joint, not elsewhere classified: Secondary | ICD-10-CM | POA: Diagnosis not present

## 2018-04-20 DIAGNOSIS — M25512 Pain in left shoulder: Secondary | ICD-10-CM | POA: Diagnosis not present

## 2018-04-21 DIAGNOSIS — I350 Nonrheumatic aortic (valve) stenosis: Secondary | ICD-10-CM | POA: Diagnosis not present

## 2018-04-21 DIAGNOSIS — Z952 Presence of prosthetic heart valve: Secondary | ICD-10-CM | POA: Diagnosis not present

## 2018-04-21 DIAGNOSIS — I25118 Atherosclerotic heart disease of native coronary artery with other forms of angina pectoris: Secondary | ICD-10-CM | POA: Diagnosis not present

## 2018-04-21 DIAGNOSIS — I10 Essential (primary) hypertension: Secondary | ICD-10-CM | POA: Diagnosis not present

## 2018-04-21 DIAGNOSIS — I480 Paroxysmal atrial fibrillation: Secondary | ICD-10-CM | POA: Diagnosis not present

## 2018-04-22 DIAGNOSIS — M25511 Pain in right shoulder: Secondary | ICD-10-CM | POA: Diagnosis not present

## 2018-04-22 DIAGNOSIS — M6281 Muscle weakness (generalized): Secondary | ICD-10-CM | POA: Diagnosis not present

## 2018-04-22 DIAGNOSIS — M542 Cervicalgia: Secondary | ICD-10-CM | POA: Diagnosis not present

## 2018-04-22 DIAGNOSIS — M25512 Pain in left shoulder: Secondary | ICD-10-CM | POA: Diagnosis not present

## 2018-04-22 DIAGNOSIS — M25519 Pain in unspecified shoulder: Secondary | ICD-10-CM | POA: Diagnosis not present

## 2018-04-22 DIAGNOSIS — R293 Abnormal posture: Secondary | ICD-10-CM | POA: Diagnosis not present

## 2018-04-26 DIAGNOSIS — M542 Cervicalgia: Secondary | ICD-10-CM | POA: Diagnosis not present

## 2018-04-26 DIAGNOSIS — M25511 Pain in right shoulder: Secondary | ICD-10-CM | POA: Diagnosis not present

## 2018-04-26 DIAGNOSIS — M25512 Pain in left shoulder: Secondary | ICD-10-CM | POA: Diagnosis not present

## 2018-04-26 DIAGNOSIS — R293 Abnormal posture: Secondary | ICD-10-CM | POA: Diagnosis not present

## 2018-04-26 DIAGNOSIS — M6281 Muscle weakness (generalized): Secondary | ICD-10-CM | POA: Diagnosis not present

## 2018-04-26 DIAGNOSIS — M25519 Pain in unspecified shoulder: Secondary | ICD-10-CM | POA: Diagnosis not present

## 2018-04-29 DIAGNOSIS — M25511 Pain in right shoulder: Secondary | ICD-10-CM | POA: Diagnosis not present

## 2018-04-29 DIAGNOSIS — M25512 Pain in left shoulder: Secondary | ICD-10-CM | POA: Diagnosis not present

## 2018-04-29 DIAGNOSIS — M542 Cervicalgia: Secondary | ICD-10-CM | POA: Diagnosis not present

## 2018-04-29 DIAGNOSIS — M6281 Muscle weakness (generalized): Secondary | ICD-10-CM | POA: Diagnosis not present

## 2018-04-29 DIAGNOSIS — R293 Abnormal posture: Secondary | ICD-10-CM | POA: Diagnosis not present

## 2018-04-29 DIAGNOSIS — M25519 Pain in unspecified shoulder: Secondary | ICD-10-CM | POA: Diagnosis not present

## 2018-05-04 DIAGNOSIS — M6281 Muscle weakness (generalized): Secondary | ICD-10-CM | POA: Diagnosis not present

## 2018-05-04 DIAGNOSIS — R293 Abnormal posture: Secondary | ICD-10-CM | POA: Diagnosis not present

## 2018-05-04 DIAGNOSIS — M25511 Pain in right shoulder: Secondary | ICD-10-CM | POA: Diagnosis not present

## 2018-05-04 DIAGNOSIS — L821 Other seborrheic keratosis: Secondary | ICD-10-CM | POA: Diagnosis not present

## 2018-05-04 DIAGNOSIS — M542 Cervicalgia: Secondary | ICD-10-CM | POA: Diagnosis not present

## 2018-05-04 DIAGNOSIS — M25519 Pain in unspecified shoulder: Secondary | ICD-10-CM | POA: Diagnosis not present

## 2018-05-04 DIAGNOSIS — L570 Actinic keratosis: Secondary | ICD-10-CM | POA: Diagnosis not present

## 2018-05-04 DIAGNOSIS — M25512 Pain in left shoulder: Secondary | ICD-10-CM | POA: Diagnosis not present

## 2018-05-06 DIAGNOSIS — R293 Abnormal posture: Secondary | ICD-10-CM | POA: Diagnosis not present

## 2018-05-06 DIAGNOSIS — M25611 Stiffness of right shoulder, not elsewhere classified: Secondary | ICD-10-CM | POA: Diagnosis not present

## 2018-05-06 DIAGNOSIS — M542 Cervicalgia: Secondary | ICD-10-CM | POA: Diagnosis not present

## 2018-05-06 DIAGNOSIS — M2560 Stiffness of unspecified joint, not elsewhere classified: Secondary | ICD-10-CM | POA: Diagnosis not present

## 2018-05-06 DIAGNOSIS — M6281 Muscle weakness (generalized): Secondary | ICD-10-CM | POA: Diagnosis not present

## 2018-05-06 DIAGNOSIS — M25512 Pain in left shoulder: Secondary | ICD-10-CM | POA: Diagnosis not present

## 2018-05-06 DIAGNOSIS — M25511 Pain in right shoulder: Secondary | ICD-10-CM | POA: Diagnosis not present

## 2018-05-06 DIAGNOSIS — M25612 Stiffness of left shoulder, not elsewhere classified: Secondary | ICD-10-CM | POA: Diagnosis not present

## 2018-05-10 DIAGNOSIS — M542 Cervicalgia: Secondary | ICD-10-CM | POA: Diagnosis not present

## 2018-05-10 DIAGNOSIS — M6281 Muscle weakness (generalized): Secondary | ICD-10-CM | POA: Diagnosis not present

## 2018-05-10 DIAGNOSIS — R293 Abnormal posture: Secondary | ICD-10-CM | POA: Diagnosis not present

## 2018-05-10 DIAGNOSIS — M25512 Pain in left shoulder: Secondary | ICD-10-CM | POA: Diagnosis not present

## 2018-05-10 DIAGNOSIS — M25611 Stiffness of right shoulder, not elsewhere classified: Secondary | ICD-10-CM | POA: Diagnosis not present

## 2018-05-10 DIAGNOSIS — M25511 Pain in right shoulder: Secondary | ICD-10-CM | POA: Diagnosis not present

## 2018-05-13 DIAGNOSIS — M6281 Muscle weakness (generalized): Secondary | ICD-10-CM | POA: Diagnosis not present

## 2018-05-13 DIAGNOSIS — R293 Abnormal posture: Secondary | ICD-10-CM | POA: Diagnosis not present

## 2018-05-13 DIAGNOSIS — M25512 Pain in left shoulder: Secondary | ICD-10-CM | POA: Diagnosis not present

## 2018-05-13 DIAGNOSIS — Z45018 Encounter for adjustment and management of other part of cardiac pacemaker: Secondary | ICD-10-CM | POA: Diagnosis not present

## 2018-05-13 DIAGNOSIS — M542 Cervicalgia: Secondary | ICD-10-CM | POA: Diagnosis not present

## 2018-05-13 DIAGNOSIS — I495 Sick sinus syndrome: Secondary | ICD-10-CM | POA: Diagnosis not present

## 2018-05-13 DIAGNOSIS — M25511 Pain in right shoulder: Secondary | ICD-10-CM | POA: Diagnosis not present

## 2018-05-13 DIAGNOSIS — M25611 Stiffness of right shoulder, not elsewhere classified: Secondary | ICD-10-CM | POA: Diagnosis not present

## 2018-05-17 DIAGNOSIS — M25512 Pain in left shoulder: Secondary | ICD-10-CM | POA: Diagnosis not present

## 2018-05-17 DIAGNOSIS — Z6825 Body mass index (BMI) 25.0-25.9, adult: Secondary | ICD-10-CM | POA: Diagnosis not present

## 2018-05-17 DIAGNOSIS — M545 Low back pain: Secondary | ICD-10-CM | POA: Diagnosis not present

## 2018-05-17 DIAGNOSIS — R109 Unspecified abdominal pain: Secondary | ICD-10-CM | POA: Diagnosis not present

## 2018-05-18 DIAGNOSIS — M1990 Unspecified osteoarthritis, unspecified site: Secondary | ICD-10-CM | POA: Diagnosis not present

## 2018-05-18 DIAGNOSIS — Z6825 Body mass index (BMI) 25.0-25.9, adult: Secondary | ICD-10-CM | POA: Diagnosis not present

## 2018-05-18 DIAGNOSIS — N419 Inflammatory disease of prostate, unspecified: Secondary | ICD-10-CM | POA: Diagnosis not present

## 2018-05-18 DIAGNOSIS — M545 Low back pain: Secondary | ICD-10-CM | POA: Diagnosis not present

## 2018-05-18 DIAGNOSIS — M8580 Other specified disorders of bone density and structure, unspecified site: Secondary | ICD-10-CM | POA: Diagnosis not present

## 2018-05-18 DIAGNOSIS — M25512 Pain in left shoulder: Secondary | ICD-10-CM | POA: Diagnosis not present

## 2018-05-20 DIAGNOSIS — M6281 Muscle weakness (generalized): Secondary | ICD-10-CM | POA: Diagnosis not present

## 2018-05-20 DIAGNOSIS — M542 Cervicalgia: Secondary | ICD-10-CM | POA: Diagnosis not present

## 2018-05-20 DIAGNOSIS — R293 Abnormal posture: Secondary | ICD-10-CM | POA: Diagnosis not present

## 2018-05-20 DIAGNOSIS — Z7901 Long term (current) use of anticoagulants: Secondary | ICD-10-CM | POA: Diagnosis not present

## 2018-05-20 DIAGNOSIS — M25511 Pain in right shoulder: Secondary | ICD-10-CM | POA: Diagnosis not present

## 2018-05-20 DIAGNOSIS — M25512 Pain in left shoulder: Secondary | ICD-10-CM | POA: Diagnosis not present

## 2018-05-20 DIAGNOSIS — M25611 Stiffness of right shoulder, not elsewhere classified: Secondary | ICD-10-CM | POA: Diagnosis not present

## 2018-05-21 DIAGNOSIS — Z7982 Long term (current) use of aspirin: Secondary | ICD-10-CM | POA: Diagnosis not present

## 2018-05-21 DIAGNOSIS — D473 Essential (hemorrhagic) thrombocythemia: Secondary | ICD-10-CM | POA: Diagnosis not present

## 2018-05-21 DIAGNOSIS — Z79899 Other long term (current) drug therapy: Secondary | ICD-10-CM | POA: Diagnosis not present

## 2018-05-21 DIAGNOSIS — R109 Unspecified abdominal pain: Secondary | ICD-10-CM | POA: Diagnosis not present

## 2018-05-25 DIAGNOSIS — M25511 Pain in right shoulder: Secondary | ICD-10-CM | POA: Diagnosis not present

## 2018-05-25 DIAGNOSIS — Z7901 Long term (current) use of anticoagulants: Secondary | ICD-10-CM | POA: Diagnosis not present

## 2018-05-25 DIAGNOSIS — M25512 Pain in left shoulder: Secondary | ICD-10-CM | POA: Diagnosis not present

## 2018-05-25 DIAGNOSIS — R293 Abnormal posture: Secondary | ICD-10-CM | POA: Diagnosis not present

## 2018-05-25 DIAGNOSIS — M542 Cervicalgia: Secondary | ICD-10-CM | POA: Diagnosis not present

## 2018-05-25 DIAGNOSIS — M25611 Stiffness of right shoulder, not elsewhere classified: Secondary | ICD-10-CM | POA: Diagnosis not present

## 2018-05-25 DIAGNOSIS — M6281 Muscle weakness (generalized): Secondary | ICD-10-CM | POA: Diagnosis not present

## 2018-05-27 DIAGNOSIS — M6281 Muscle weakness (generalized): Secondary | ICD-10-CM | POA: Diagnosis not present

## 2018-05-27 DIAGNOSIS — M25611 Stiffness of right shoulder, not elsewhere classified: Secondary | ICD-10-CM | POA: Diagnosis not present

## 2018-05-27 DIAGNOSIS — M25511 Pain in right shoulder: Secondary | ICD-10-CM | POA: Diagnosis not present

## 2018-05-27 DIAGNOSIS — M25512 Pain in left shoulder: Secondary | ICD-10-CM | POA: Diagnosis not present

## 2018-05-27 DIAGNOSIS — M542 Cervicalgia: Secondary | ICD-10-CM | POA: Diagnosis not present

## 2018-05-27 DIAGNOSIS — R293 Abnormal posture: Secondary | ICD-10-CM | POA: Diagnosis not present

## 2018-05-31 DIAGNOSIS — M542 Cervicalgia: Secondary | ICD-10-CM | POA: Diagnosis not present

## 2018-05-31 DIAGNOSIS — M25511 Pain in right shoulder: Secondary | ICD-10-CM | POA: Diagnosis not present

## 2018-05-31 DIAGNOSIS — M25611 Stiffness of right shoulder, not elsewhere classified: Secondary | ICD-10-CM | POA: Diagnosis not present

## 2018-05-31 DIAGNOSIS — M25512 Pain in left shoulder: Secondary | ICD-10-CM | POA: Diagnosis not present

## 2018-05-31 DIAGNOSIS — M6281 Muscle weakness (generalized): Secondary | ICD-10-CM | POA: Diagnosis not present

## 2018-05-31 DIAGNOSIS — R293 Abnormal posture: Secondary | ICD-10-CM | POA: Diagnosis not present

## 2018-06-01 DIAGNOSIS — M47812 Spondylosis without myelopathy or radiculopathy, cervical region: Secondary | ICD-10-CM | POA: Diagnosis not present

## 2018-06-03 DIAGNOSIS — M542 Cervicalgia: Secondary | ICD-10-CM | POA: Diagnosis not present

## 2018-06-03 DIAGNOSIS — M25512 Pain in left shoulder: Secondary | ICD-10-CM | POA: Diagnosis not present

## 2018-06-03 DIAGNOSIS — M6281 Muscle weakness (generalized): Secondary | ICD-10-CM | POA: Diagnosis not present

## 2018-06-03 DIAGNOSIS — R293 Abnormal posture: Secondary | ICD-10-CM | POA: Diagnosis not present

## 2018-06-03 DIAGNOSIS — M25511 Pain in right shoulder: Secondary | ICD-10-CM | POA: Diagnosis not present

## 2018-06-03 DIAGNOSIS — M25611 Stiffness of right shoulder, not elsewhere classified: Secondary | ICD-10-CM | POA: Diagnosis not present

## 2018-06-08 DIAGNOSIS — M25511 Pain in right shoulder: Secondary | ICD-10-CM | POA: Diagnosis not present

## 2018-06-08 DIAGNOSIS — M2560 Stiffness of unspecified joint, not elsewhere classified: Secondary | ICD-10-CM | POA: Diagnosis not present

## 2018-06-08 DIAGNOSIS — M25611 Stiffness of right shoulder, not elsewhere classified: Secondary | ICD-10-CM | POA: Diagnosis not present

## 2018-06-08 DIAGNOSIS — M542 Cervicalgia: Secondary | ICD-10-CM | POA: Diagnosis not present

## 2018-06-08 DIAGNOSIS — R293 Abnormal posture: Secondary | ICD-10-CM | POA: Diagnosis not present

## 2018-06-08 DIAGNOSIS — M25612 Stiffness of left shoulder, not elsewhere classified: Secondary | ICD-10-CM | POA: Diagnosis not present

## 2018-06-08 DIAGNOSIS — M25512 Pain in left shoulder: Secondary | ICD-10-CM | POA: Diagnosis not present

## 2018-06-08 DIAGNOSIS — M6281 Muscle weakness (generalized): Secondary | ICD-10-CM | POA: Diagnosis not present

## 2018-06-09 DIAGNOSIS — D473 Essential (hemorrhagic) thrombocythemia: Secondary | ICD-10-CM | POA: Diagnosis not present

## 2018-06-09 DIAGNOSIS — Z7982 Long term (current) use of aspirin: Secondary | ICD-10-CM | POA: Diagnosis not present

## 2018-06-09 DIAGNOSIS — Z79899 Other long term (current) drug therapy: Secondary | ICD-10-CM | POA: Diagnosis not present

## 2018-06-10 DIAGNOSIS — M25511 Pain in right shoulder: Secondary | ICD-10-CM | POA: Diagnosis not present

## 2018-06-10 DIAGNOSIS — M6281 Muscle weakness (generalized): Secondary | ICD-10-CM | POA: Diagnosis not present

## 2018-06-10 DIAGNOSIS — M25512 Pain in left shoulder: Secondary | ICD-10-CM | POA: Diagnosis not present

## 2018-06-10 DIAGNOSIS — M542 Cervicalgia: Secondary | ICD-10-CM | POA: Diagnosis not present

## 2018-06-10 DIAGNOSIS — Z7901 Long term (current) use of anticoagulants: Secondary | ICD-10-CM | POA: Diagnosis not present

## 2018-06-10 DIAGNOSIS — R293 Abnormal posture: Secondary | ICD-10-CM | POA: Diagnosis not present

## 2018-06-10 DIAGNOSIS — M25611 Stiffness of right shoulder, not elsewhere classified: Secondary | ICD-10-CM | POA: Diagnosis not present

## 2018-06-11 DIAGNOSIS — N35912 Unspecified bulbous urethral stricture, male: Secondary | ICD-10-CM | POA: Diagnosis not present

## 2018-06-15 DIAGNOSIS — Z7901 Long term (current) use of anticoagulants: Secondary | ICD-10-CM | POA: Diagnosis not present

## 2018-06-15 DIAGNOSIS — M5030 Other cervical disc degeneration, unspecified cervical region: Secondary | ICD-10-CM | POA: Diagnosis not present

## 2018-06-15 DIAGNOSIS — M5412 Radiculopathy, cervical region: Secondary | ICD-10-CM | POA: Diagnosis not present

## 2018-06-15 DIAGNOSIS — G8929 Other chronic pain: Secondary | ICD-10-CM | POA: Diagnosis not present

## 2018-06-15 DIAGNOSIS — Z79891 Long term (current) use of opiate analgesic: Secondary | ICD-10-CM | POA: Diagnosis not present

## 2018-06-15 DIAGNOSIS — M9981 Other biomechanical lesions of cervical region: Secondary | ICD-10-CM | POA: Diagnosis not present

## 2018-06-22 DIAGNOSIS — M5031 Other cervical disc degeneration,  high cervical region: Secondary | ICD-10-CM | POA: Diagnosis not present

## 2018-06-22 DIAGNOSIS — G8929 Other chronic pain: Secondary | ICD-10-CM | POA: Diagnosis not present

## 2018-06-22 DIAGNOSIS — M50322 Other cervical disc degeneration at C5-C6 level: Secondary | ICD-10-CM | POA: Diagnosis not present

## 2018-06-22 DIAGNOSIS — M50323 Other cervical disc degeneration at C6-C7 level: Secondary | ICD-10-CM | POA: Diagnosis not present

## 2018-06-22 DIAGNOSIS — M5412 Radiculopathy, cervical region: Secondary | ICD-10-CM | POA: Diagnosis not present

## 2018-06-22 DIAGNOSIS — M4802 Spinal stenosis, cervical region: Secondary | ICD-10-CM | POA: Diagnosis not present

## 2018-06-22 DIAGNOSIS — Z79891 Long term (current) use of opiate analgesic: Secondary | ICD-10-CM | POA: Diagnosis not present

## 2018-06-23 DIAGNOSIS — M50323 Other cervical disc degeneration at C6-C7 level: Secondary | ICD-10-CM | POA: Diagnosis not present

## 2018-06-23 DIAGNOSIS — M5031 Other cervical disc degeneration,  high cervical region: Secondary | ICD-10-CM | POA: Diagnosis not present

## 2018-06-23 DIAGNOSIS — M542 Cervicalgia: Secondary | ICD-10-CM | POA: Diagnosis not present

## 2018-06-23 DIAGNOSIS — M47812 Spondylosis without myelopathy or radiculopathy, cervical region: Secondary | ICD-10-CM | POA: Diagnosis not present

## 2018-06-23 DIAGNOSIS — M50321 Other cervical disc degeneration at C4-C5 level: Secondary | ICD-10-CM | POA: Diagnosis not present

## 2018-07-08 DIAGNOSIS — M47892 Other spondylosis, cervical region: Secondary | ICD-10-CM | POA: Diagnosis not present

## 2018-07-15 DIAGNOSIS — Z7901 Long term (current) use of anticoagulants: Secondary | ICD-10-CM | POA: Diagnosis not present

## 2018-07-19 DIAGNOSIS — E559 Vitamin D deficiency, unspecified: Secondary | ICD-10-CM | POA: Diagnosis not present

## 2018-07-19 DIAGNOSIS — M859 Disorder of bone density and structure, unspecified: Secondary | ICD-10-CM | POA: Diagnosis not present

## 2018-07-19 DIAGNOSIS — Z23 Encounter for immunization: Secondary | ICD-10-CM | POA: Diagnosis not present

## 2018-07-19 DIAGNOSIS — Z6825 Body mass index (BMI) 25.0-25.9, adult: Secondary | ICD-10-CM | POA: Diagnosis not present

## 2018-07-19 DIAGNOSIS — I10 Essential (primary) hypertension: Secondary | ICD-10-CM | POA: Diagnosis not present

## 2018-07-19 DIAGNOSIS — Z13820 Encounter for screening for osteoporosis: Secondary | ICD-10-CM | POA: Diagnosis not present

## 2018-07-19 DIAGNOSIS — G609 Hereditary and idiopathic neuropathy, unspecified: Secondary | ICD-10-CM | POA: Diagnosis not present

## 2018-07-19 DIAGNOSIS — Z1331 Encounter for screening for depression: Secondary | ICD-10-CM | POA: Diagnosis not present

## 2018-07-19 DIAGNOSIS — R5383 Other fatigue: Secondary | ICD-10-CM | POA: Diagnosis not present

## 2018-07-19 DIAGNOSIS — M8580 Other specified disorders of bone density and structure, unspecified site: Secondary | ICD-10-CM | POA: Diagnosis not present

## 2018-08-04 DIAGNOSIS — Z961 Presence of intraocular lens: Secondary | ICD-10-CM | POA: Diagnosis not present

## 2018-08-04 DIAGNOSIS — H524 Presbyopia: Secondary | ICD-10-CM | POA: Diagnosis not present

## 2018-08-04 DIAGNOSIS — H43813 Vitreous degeneration, bilateral: Secondary | ICD-10-CM | POA: Diagnosis not present

## 2018-08-04 DIAGNOSIS — H35373 Puckering of macula, bilateral: Secondary | ICD-10-CM | POA: Diagnosis not present

## 2018-08-04 DIAGNOSIS — H35343 Macular cyst, hole, or pseudohole, bilateral: Secondary | ICD-10-CM | POA: Diagnosis not present

## 2018-08-09 DIAGNOSIS — Z79899 Other long term (current) drug therapy: Secondary | ICD-10-CM | POA: Diagnosis not present

## 2018-08-09 DIAGNOSIS — D6489 Other specified anemias: Secondary | ICD-10-CM | POA: Diagnosis not present

## 2018-08-09 DIAGNOSIS — D473 Essential (hemorrhagic) thrombocythemia: Secondary | ICD-10-CM | POA: Diagnosis not present

## 2018-08-09 DIAGNOSIS — Z7982 Long term (current) use of aspirin: Secondary | ICD-10-CM | POA: Diagnosis not present

## 2018-08-12 DIAGNOSIS — Z7901 Long term (current) use of anticoagulants: Secondary | ICD-10-CM | POA: Diagnosis not present

## 2018-08-13 DIAGNOSIS — Z950 Presence of cardiac pacemaker: Secondary | ICD-10-CM | POA: Diagnosis not present

## 2018-09-07 DIAGNOSIS — R1032 Left lower quadrant pain: Secondary | ICD-10-CM | POA: Diagnosis not present

## 2018-09-07 DIAGNOSIS — K5900 Constipation, unspecified: Secondary | ICD-10-CM | POA: Diagnosis not present

## 2018-09-07 DIAGNOSIS — K5730 Diverticulosis of large intestine without perforation or abscess without bleeding: Secondary | ICD-10-CM | POA: Diagnosis not present

## 2018-09-10 DIAGNOSIS — C61 Malignant neoplasm of prostate: Secondary | ICD-10-CM | POA: Diagnosis not present

## 2018-09-14 DIAGNOSIS — Z6826 Body mass index (BMI) 26.0-26.9, adult: Secondary | ICD-10-CM | POA: Diagnosis not present

## 2018-09-14 DIAGNOSIS — K5900 Constipation, unspecified: Secondary | ICD-10-CM | POA: Diagnosis not present

## 2018-09-14 DIAGNOSIS — Z7901 Long term (current) use of anticoagulants: Secondary | ICD-10-CM | POA: Diagnosis not present

## 2018-09-14 DIAGNOSIS — Z8673 Personal history of transient ischemic attack (TIA), and cerebral infarction without residual deficits: Secondary | ICD-10-CM | POA: Diagnosis not present

## 2018-09-14 DIAGNOSIS — I10 Essential (primary) hypertension: Secondary | ICD-10-CM | POA: Diagnosis not present

## 2018-09-16 DIAGNOSIS — I129 Hypertensive chronic kidney disease with stage 1 through stage 4 chronic kidney disease, or unspecified chronic kidney disease: Secondary | ICD-10-CM | POA: Diagnosis not present

## 2018-09-16 DIAGNOSIS — I509 Heart failure, unspecified: Secondary | ICD-10-CM | POA: Diagnosis not present

## 2018-09-16 DIAGNOSIS — N2581 Secondary hyperparathyroidism of renal origin: Secondary | ICD-10-CM | POA: Diagnosis not present

## 2018-09-16 DIAGNOSIS — D473 Essential (hemorrhagic) thrombocythemia: Secondary | ICD-10-CM | POA: Diagnosis not present

## 2018-09-16 DIAGNOSIS — N390 Urinary tract infection, site not specified: Secondary | ICD-10-CM | POA: Diagnosis not present

## 2018-09-16 DIAGNOSIS — D631 Anemia in chronic kidney disease: Secondary | ICD-10-CM | POA: Diagnosis not present

## 2018-09-16 DIAGNOSIS — Z952 Presence of prosthetic heart valve: Secondary | ICD-10-CM | POA: Diagnosis not present

## 2018-09-16 DIAGNOSIS — N189 Chronic kidney disease, unspecified: Secondary | ICD-10-CM | POA: Diagnosis not present

## 2018-09-16 DIAGNOSIS — N183 Chronic kidney disease, stage 3 (moderate): Secondary | ICD-10-CM | POA: Diagnosis not present

## 2018-09-16 DIAGNOSIS — I639 Cerebral infarction, unspecified: Secondary | ICD-10-CM | POA: Diagnosis not present

## 2018-09-16 DIAGNOSIS — I498 Other specified cardiac arrhythmias: Secondary | ICD-10-CM | POA: Diagnosis not present

## 2018-10-01 DIAGNOSIS — I129 Hypertensive chronic kidney disease with stage 1 through stage 4 chronic kidney disease, or unspecified chronic kidney disease: Secondary | ICD-10-CM | POA: Diagnosis not present

## 2018-10-11 DIAGNOSIS — D473 Essential (hemorrhagic) thrombocythemia: Secondary | ICD-10-CM | POA: Diagnosis not present

## 2018-10-11 DIAGNOSIS — Z79899 Other long term (current) drug therapy: Secondary | ICD-10-CM | POA: Diagnosis not present

## 2018-10-12 DIAGNOSIS — R300 Dysuria: Secondary | ICD-10-CM | POA: Diagnosis not present

## 2018-10-12 DIAGNOSIS — C61 Malignant neoplasm of prostate: Secondary | ICD-10-CM | POA: Diagnosis not present

## 2018-10-18 DIAGNOSIS — B965 Pseudomonas (aeruginosa) (mallei) (pseudomallei) as the cause of diseases classified elsewhere: Secondary | ICD-10-CM | POA: Diagnosis not present

## 2018-10-18 DIAGNOSIS — J45909 Unspecified asthma, uncomplicated: Secondary | ICD-10-CM | POA: Diagnosis not present

## 2018-10-18 DIAGNOSIS — Z7951 Long term (current) use of inhaled steroids: Secondary | ICD-10-CM | POA: Diagnosis not present

## 2018-10-18 DIAGNOSIS — Z9079 Acquired absence of other genital organ(s): Secondary | ICD-10-CM | POA: Diagnosis not present

## 2018-10-18 DIAGNOSIS — Z7901 Long term (current) use of anticoagulants: Secondary | ICD-10-CM | POA: Diagnosis not present

## 2018-10-18 DIAGNOSIS — N35919 Unspecified urethral stricture, male, unspecified site: Secondary | ICD-10-CM | POA: Diagnosis not present

## 2018-10-18 DIAGNOSIS — Z7982 Long term (current) use of aspirin: Secondary | ICD-10-CM | POA: Diagnosis not present

## 2018-10-18 DIAGNOSIS — Z452 Encounter for adjustment and management of vascular access device: Secondary | ICD-10-CM | POA: Diagnosis not present

## 2018-10-18 DIAGNOSIS — C61 Malignant neoplasm of prostate: Secondary | ICD-10-CM | POA: Diagnosis not present

## 2018-10-18 DIAGNOSIS — I10 Essential (primary) hypertension: Secondary | ICD-10-CM | POA: Diagnosis not present

## 2018-10-18 DIAGNOSIS — I2510 Atherosclerotic heart disease of native coronary artery without angina pectoris: Secondary | ICD-10-CM | POA: Diagnosis not present

## 2018-10-18 DIAGNOSIS — R339 Retention of urine, unspecified: Secondary | ICD-10-CM | POA: Diagnosis not present

## 2018-10-18 DIAGNOSIS — N390 Urinary tract infection, site not specified: Secondary | ICD-10-CM | POA: Diagnosis not present

## 2018-10-18 DIAGNOSIS — Z9981 Dependence on supplemental oxygen: Secondary | ICD-10-CM | POA: Diagnosis not present

## 2018-10-19 DIAGNOSIS — Z7901 Long term (current) use of anticoagulants: Secondary | ICD-10-CM | POA: Diagnosis not present

## 2018-10-22 DIAGNOSIS — I10 Essential (primary) hypertension: Secondary | ICD-10-CM | POA: Diagnosis not present

## 2018-10-22 DIAGNOSIS — C61 Malignant neoplasm of prostate: Secondary | ICD-10-CM | POA: Diagnosis not present

## 2018-10-22 DIAGNOSIS — N35919 Unspecified urethral stricture, male, unspecified site: Secondary | ICD-10-CM | POA: Diagnosis not present

## 2018-10-22 DIAGNOSIS — I2510 Atherosclerotic heart disease of native coronary artery without angina pectoris: Secondary | ICD-10-CM | POA: Diagnosis not present

## 2018-10-22 DIAGNOSIS — B965 Pseudomonas (aeruginosa) (mallei) (pseudomallei) as the cause of diseases classified elsewhere: Secondary | ICD-10-CM | POA: Diagnosis not present

## 2018-10-22 DIAGNOSIS — N390 Urinary tract infection, site not specified: Secondary | ICD-10-CM | POA: Diagnosis not present

## 2018-10-26 DIAGNOSIS — I10 Essential (primary) hypertension: Secondary | ICD-10-CM | POA: Diagnosis not present

## 2018-10-26 DIAGNOSIS — N35919 Unspecified urethral stricture, male, unspecified site: Secondary | ICD-10-CM | POA: Diagnosis not present

## 2018-10-26 DIAGNOSIS — I2510 Atherosclerotic heart disease of native coronary artery without angina pectoris: Secondary | ICD-10-CM | POA: Diagnosis not present

## 2018-10-26 DIAGNOSIS — C61 Malignant neoplasm of prostate: Secondary | ICD-10-CM | POA: Diagnosis not present

## 2018-10-26 DIAGNOSIS — B965 Pseudomonas (aeruginosa) (mallei) (pseudomallei) as the cause of diseases classified elsewhere: Secondary | ICD-10-CM | POA: Diagnosis not present

## 2018-10-26 DIAGNOSIS — N390 Urinary tract infection, site not specified: Secondary | ICD-10-CM | POA: Diagnosis not present

## 2018-11-03 DIAGNOSIS — M47812 Spondylosis without myelopathy or radiculopathy, cervical region: Secondary | ICD-10-CM | POA: Diagnosis not present

## 2018-11-03 DIAGNOSIS — M5030 Other cervical disc degeneration, unspecified cervical region: Secondary | ICD-10-CM | POA: Diagnosis not present

## 2018-11-03 DIAGNOSIS — M542 Cervicalgia: Secondary | ICD-10-CM | POA: Diagnosis not present

## 2018-11-03 DIAGNOSIS — G8929 Other chronic pain: Secondary | ICD-10-CM | POA: Diagnosis not present

## 2018-11-03 DIAGNOSIS — Z7901 Long term (current) use of anticoagulants: Secondary | ICD-10-CM | POA: Diagnosis not present

## 2018-11-05 ENCOUNTER — Other Ambulatory Visit: Payer: Self-pay

## 2018-11-05 ENCOUNTER — Emergency Department (HOSPITAL_COMMUNITY): Payer: Medicare Other

## 2018-11-05 ENCOUNTER — Encounter (HOSPITAL_COMMUNITY): Payer: Self-pay | Admitting: *Deleted

## 2018-11-05 ENCOUNTER — Inpatient Hospital Stay (HOSPITAL_COMMUNITY)
Admission: EM | Admit: 2018-11-05 | Discharge: 2018-11-08 | DRG: 190 | Disposition: A | Discharge status: Home Health | Payer: Medicare Other | Attending: Family Medicine | Admitting: Family Medicine

## 2018-11-05 DIAGNOSIS — J440 Chronic obstructive pulmonary disease with acute lower respiratory infection: Secondary | ICD-10-CM | POA: Diagnosis present

## 2018-11-05 DIAGNOSIS — Z6825 Body mass index (BMI) 25.0-25.9, adult: Secondary | ICD-10-CM | POA: Diagnosis not present

## 2018-11-05 DIAGNOSIS — Z825 Family history of asthma and other chronic lower respiratory diseases: Secondary | ICD-10-CM

## 2018-11-05 DIAGNOSIS — Z820 Family history of epilepsy and other diseases of the nervous system: Secondary | ICD-10-CM

## 2018-11-05 DIAGNOSIS — J9621 Acute and chronic respiratory failure with hypoxia: Secondary | ICD-10-CM | POA: Diagnosis present

## 2018-11-05 DIAGNOSIS — I10 Essential (primary) hypertension: Secondary | ICD-10-CM | POA: Diagnosis present

## 2018-11-05 DIAGNOSIS — Z7951 Long term (current) use of inhaled steroids: Secondary | ICD-10-CM

## 2018-11-05 DIAGNOSIS — J441 Chronic obstructive pulmonary disease with (acute) exacerbation: Secondary | ICD-10-CM | POA: Diagnosis present

## 2018-11-05 DIAGNOSIS — J189 Pneumonia, unspecified organism: Secondary | ICD-10-CM | POA: Diagnosis present

## 2018-11-05 DIAGNOSIS — Z8249 Family history of ischemic heart disease and other diseases of the circulatory system: Secondary | ICD-10-CM

## 2018-11-05 DIAGNOSIS — K219 Gastro-esophageal reflux disease without esophagitis: Secondary | ICD-10-CM | POA: Diagnosis present

## 2018-11-05 DIAGNOSIS — I2510 Atherosclerotic heart disease of native coronary artery without angina pectoris: Secondary | ICD-10-CM | POA: Diagnosis present

## 2018-11-05 DIAGNOSIS — Z7901 Long term (current) use of anticoagulants: Secondary | ICD-10-CM | POA: Diagnosis not present

## 2018-11-05 DIAGNOSIS — Z8546 Personal history of malignant neoplasm of prostate: Secondary | ICD-10-CM | POA: Diagnosis not present

## 2018-11-05 DIAGNOSIS — Z9981 Dependence on supplemental oxygen: Secondary | ICD-10-CM | POA: Diagnosis not present

## 2018-11-05 DIAGNOSIS — Z8614 Personal history of Methicillin resistant Staphylococcus aureus infection: Secondary | ICD-10-CM | POA: Diagnosis not present

## 2018-11-05 DIAGNOSIS — K5900 Constipation, unspecified: Secondary | ICD-10-CM | POA: Diagnosis present

## 2018-11-05 DIAGNOSIS — Z8719 Personal history of other diseases of the digestive system: Secondary | ICD-10-CM | POA: Diagnosis not present

## 2018-11-05 DIAGNOSIS — Z79899 Other long term (current) drug therapy: Secondary | ICD-10-CM

## 2018-11-05 DIAGNOSIS — R0602 Shortness of breath: Secondary | ICD-10-CM | POA: Diagnosis not present

## 2018-11-05 DIAGNOSIS — Z952 Presence of prosthetic heart valve: Secondary | ICD-10-CM | POA: Diagnosis not present

## 2018-11-05 DIAGNOSIS — R000 Tachycardia, unspecified: Secondary | ICD-10-CM | POA: Diagnosis not present

## 2018-11-05 DIAGNOSIS — R339 Retention of urine, unspecified: Secondary | ICD-10-CM | POA: Diagnosis present

## 2018-11-05 DIAGNOSIS — R05 Cough: Secondary | ICD-10-CM | POA: Diagnosis not present

## 2018-11-05 DIAGNOSIS — J9601 Acute respiratory failure with hypoxia: Secondary | ICD-10-CM

## 2018-11-05 DIAGNOSIS — D539 Nutritional anemia, unspecified: Secondary | ICD-10-CM | POA: Diagnosis present

## 2018-11-05 DIAGNOSIS — B974 Respiratory syncytial virus as the cause of diseases classified elsewhere: Secondary | ICD-10-CM | POA: Diagnosis present

## 2018-11-05 DIAGNOSIS — R062 Wheezing: Secondary | ICD-10-CM | POA: Diagnosis not present

## 2018-11-05 DIAGNOSIS — E669 Obesity, unspecified: Secondary | ICD-10-CM | POA: Diagnosis present

## 2018-11-05 DIAGNOSIS — I480 Paroxysmal atrial fibrillation: Secondary | ICD-10-CM | POA: Diagnosis present

## 2018-11-05 DIAGNOSIS — M1990 Unspecified osteoarthritis, unspecified site: Secondary | ICD-10-CM | POA: Diagnosis present

## 2018-11-05 DIAGNOSIS — J988 Other specified respiratory disorders: Secondary | ICD-10-CM | POA: Diagnosis not present

## 2018-11-05 DIAGNOSIS — Z9049 Acquired absence of other specified parts of digestive tract: Secondary | ICD-10-CM

## 2018-11-05 DIAGNOSIS — E785 Hyperlipidemia, unspecified: Secondary | ICD-10-CM | POA: Diagnosis present

## 2018-11-05 HISTORY — DX: Anemia, unspecified: D64.9

## 2018-11-05 HISTORY — DX: Chronic obstructive pulmonary disease, unspecified: J44.9

## 2018-11-05 HISTORY — DX: Cardiac arrhythmia, unspecified: I49.9

## 2018-11-05 HISTORY — DX: Presence of cardiac pacemaker: Z95.0

## 2018-11-05 HISTORY — DX: Chronic kidney disease, unspecified: N18.9

## 2018-11-05 HISTORY — DX: Cardiac murmur, unspecified: R01.1

## 2018-11-05 HISTORY — DX: Heart failure, unspecified: I50.9

## 2018-11-05 HISTORY — DX: Gastro-esophageal reflux disease without esophagitis: K21.9

## 2018-11-05 HISTORY — DX: Disease of blood and blood-forming organs, unspecified: D75.9

## 2018-11-05 HISTORY — DX: Personal history of other diseases of the digestive system: Z87.19

## 2018-11-05 HISTORY — DX: Other cerebral infarction: I63.89

## 2018-11-05 HISTORY — DX: Dyspnea, unspecified: R06.00

## 2018-11-05 LAB — CBC
HCT: 30.4 % — ABNORMAL LOW (ref 39.0–52.0)
Hemoglobin: 9.5 g/dL — ABNORMAL LOW (ref 13.0–17.0)
MCH: 37.4 pg — ABNORMAL HIGH (ref 26.0–34.0)
MCHC: 31.3 g/dL (ref 30.0–36.0)
MCV: 119.7 fL — ABNORMAL HIGH (ref 80.0–100.0)
PLATELETS: 859 10*3/uL — AB (ref 150–400)
RBC: 2.54 MIL/uL — ABNORMAL LOW (ref 4.22–5.81)
RDW: 13.4 % (ref 11.5–15.5)
WBC: 4.3 10*3/uL (ref 4.0–10.5)
nRBC: 0 % (ref 0.0–0.2)

## 2018-11-05 LAB — BRAIN NATRIURETIC PEPTIDE: B NATRIURETIC PEPTIDE 5: 164.1 pg/mL — AB (ref 0.0–100.0)

## 2018-11-05 LAB — BASIC METABOLIC PANEL
Anion gap: 9 (ref 5–15)
BUN: 28 mg/dL — ABNORMAL HIGH (ref 8–23)
CO2: 24 mmol/L (ref 22–32)
Calcium: 8.8 mg/dL — ABNORMAL LOW (ref 8.9–10.3)
Chloride: 106 mmol/L (ref 98–111)
Creatinine, Ser: 1.49 mg/dL — ABNORMAL HIGH (ref 0.61–1.24)
GFR calc Af Amer: 47 mL/min — ABNORMAL LOW (ref >60)
GFR calc non Af Amer: 40 mL/min — ABNORMAL LOW (ref >60)
GLUCOSE: 129 mg/dL — AB (ref 70–99)
Potassium: 4.8 mmol/L (ref 3.5–5.1)
Sodium: 139 mmol/L (ref 135–145)

## 2018-11-05 LAB — INFLUENZA PANEL BY PCR (TYPE A & B)
Influenza A By PCR: NEGATIVE
Influenza B By PCR: NEGATIVE

## 2018-11-05 LAB — HEPATIC FUNCTION PANEL
ALT: 13 U/L (ref 0–44)
AST: 22 U/L (ref 15–41)
Albumin: 3.5 g/dL (ref 3.5–5.0)
Alkaline Phosphatase: 59 U/L (ref 38–126)
Bilirubin, Direct: <0.1 mg/dL (ref 0.0–0.2)
Total Bilirubin: 0.5 mg/dL (ref 0.3–1.2)
Total Protein: 7.2 g/dL (ref 6.5–8.1)

## 2018-11-05 LAB — LACTIC ACID, PLASMA
Lactic Acid, Venous: 1.3 mmol/L (ref 0.5–1.9)
Lactic Acid, Venous: 2.3 mmol/L (ref 0.5–1.9)

## 2018-11-05 LAB — I-STAT TROPONIN, ED: Troponin i, poc: 0.02 ng/mL (ref 0.00–0.08)

## 2018-11-05 MED ORDER — POLYETHYLENE GLYCOL 3350 17 G PO PACK
17.0000 g | PACK | Freq: Two times a day (BID) | ORAL | Status: DC
  Administered 2018-11-06 – 2018-11-08 (×5): 17 g via ORAL
  Filled 2018-11-05 (×5): qty 1

## 2018-11-05 MED ORDER — TAMSULOSIN HCL 0.4 MG PO CAPS
0.4000 mg | ORAL_CAPSULE | Freq: Every day | ORAL | Status: DC
  Administered 2018-11-05 – 2018-11-07 (×3): 0.4 mg via ORAL
  Filled 2018-11-05 (×3): qty 1

## 2018-11-05 MED ORDER — SODIUM CHLORIDE 0.9 % IV SOLN
500.0000 mg | Freq: Once | INTRAVENOUS | Status: DC
  Filled 2018-11-05: qty 500

## 2018-11-05 MED ORDER — ALBUTEROL (5 MG/ML) CONTINUOUS INHALATION SOLN
10.0000 mg/h | INHALATION_SOLUTION | Freq: Once | RESPIRATORY_TRACT | Status: AC
  Administered 2018-11-05: 10 mg/h via RESPIRATORY_TRACT
  Filled 2018-11-05: qty 20

## 2018-11-05 MED ORDER — ACETAMINOPHEN 500 MG PO TABS: 500.0000 mg | ORAL_TABLET | Freq: Four times a day (QID) | ORAL | Status: DC | PRN

## 2018-11-05 MED ORDER — IPRATROPIUM-ALBUTEROL 0.5-2.5 (3) MG/3ML IN SOLN
3.0000 mL | Freq: Four times a day (QID) | RESPIRATORY_TRACT | Status: DC
  Administered 2018-11-06 – 2018-11-08 (×10): 3 mL via RESPIRATORY_TRACT
  Filled 2018-11-05: qty 42
  Filled 2018-11-05 (×9): qty 3

## 2018-11-05 MED ORDER — METHYLPREDNISOLONE SODIUM SUCC 125 MG IJ SOLR
125.0000 mg | Freq: Once | INTRAMUSCULAR | Status: AC
  Administered 2018-11-05: 125 mg via INTRAVENOUS
  Filled 2018-11-05: qty 2

## 2018-11-05 MED ORDER — SODIUM CHLORIDE 0.9 % IV SOLN
500.0000 mg | Freq: Once | INTRAVENOUS | Status: AC
  Administered 2018-11-05: 500 mg via INTRAVENOUS

## 2018-11-05 MED ORDER — WARFARIN SODIUM 3 MG PO TABS
3.0000 mg | ORAL_TABLET | Freq: Every day | ORAL | Status: DC
  Administered 2018-11-05 – 2018-11-07 (×3): 3 mg via ORAL
  Filled 2018-11-05 (×4): qty 1

## 2018-11-05 MED ORDER — LUBIPROSTONE 24 MCG PO CAPS: 24.0000 ug | ORAL_CAPSULE | Freq: Two times a day (BID) | ORAL | Status: DC | PRN

## 2018-11-05 MED ORDER — GUAIFENESIN ER 600 MG PO TB12
600.0000 mg | ORAL_TABLET | Freq: Two times a day (BID) | ORAL | Status: DC
  Administered 2018-11-05 – 2018-11-08 (×6): 600 mg via ORAL
  Filled 2018-11-05 (×6): qty 1

## 2018-11-05 MED ORDER — PREDNISONE 50 MG PO TABS
50.0000 mg | ORAL_TABLET | Freq: Every day | ORAL | Status: DC
  Administered 2018-11-06 – 2018-11-08 (×3): 50 mg via ORAL
  Filled 2018-11-05 (×3): qty 1

## 2018-11-05 MED ORDER — FLUTICASONE PROPIONATE 50 MCG/ACT NA SUSP
1.0000 | Freq: Two times a day (BID) | NASAL | Status: DC
  Administered 2018-11-05 – 2018-11-08 (×6): 1 via NASAL
  Filled 2018-11-05: qty 16

## 2018-11-05 MED ORDER — WARFARIN - PHYSICIAN DOSING INPATIENT: Freq: Every day | Status: DC

## 2018-11-05 MED ORDER — METOPROLOL TARTRATE 25 MG PO TABS
25.0000 mg | ORAL_TABLET | Freq: Two times a day (BID) | ORAL | Status: DC
  Administered 2018-11-05 – 2018-11-08 (×6): 25 mg via ORAL
  Filled 2018-11-05 (×6): qty 1

## 2018-11-05 MED ORDER — HYDROXYUREA 500 MG PO CAPS
500.0000 mg | ORAL_CAPSULE | Freq: Two times a day (BID) | ORAL | Status: DC
  Administered 2018-11-05 – 2018-11-06 (×2): 500 mg via ORAL
  Filled 2018-11-05 (×2): qty 1

## 2018-11-05 MED ORDER — SODIUM CHLORIDE 0.9 % IV SOLN
500.0000 mg | Freq: Once | INTRAVENOUS | Status: AC
  Administered 2018-11-05: 500 mg via INTRAVENOUS
  Filled 2018-11-05: qty 500

## 2018-11-05 MED ORDER — DEXTROSE 5 % IV SOLN
250.0000 mg | INTRAVENOUS | Status: DC
  Administered 2018-11-06: 250 mg via INTRAVENOUS
  Filled 2018-11-05 (×3): qty 250

## 2018-11-05 MED ORDER — GABAPENTIN 300 MG PO CAPS
300.0000 mg | ORAL_CAPSULE | Freq: Every day | ORAL | Status: DC
  Administered 2018-11-05 – 2018-11-07 (×3): 300 mg via ORAL
  Filled 2018-11-05 (×3): qty 1

## 2018-11-05 MED ORDER — DOXYCYCLINE HYCLATE 50 MG PO CAPS
50.0000 mg | ORAL_CAPSULE | Freq: Once | ORAL | Status: DC
  Filled 2018-11-05: qty 1

## 2018-11-05 MED ORDER — SODIUM CHLORIDE 0.9 % IV SOLN
1.0000 g | INTRAVENOUS | Status: DC
  Administered 2018-11-06: 1 g via INTRAVENOUS
  Filled 2018-11-05 (×3): qty 10

## 2018-11-05 MED ORDER — CALCITRIOL 0.25 MCG PO CAPS
0.2500 ug | ORAL_CAPSULE | ORAL | Status: DC
  Administered 2018-11-06 – 2018-11-08 (×2): 0.25 ug via ORAL
  Filled 2018-11-05 (×2): qty 1

## 2018-11-05 MED ORDER — CALCIUM CARBONATE ANTACID 500 MG PO CHEW: 1.0000 | CHEWABLE_TABLET | Freq: Every day | ORAL | Status: DC | PRN

## 2018-11-05 MED ORDER — SODIUM CHLORIDE 0.9 % IV SOLN
1.0000 g | Freq: Once | INTRAVENOUS | Status: AC
  Administered 2018-11-05: 1 g via INTRAVENOUS
  Filled 2018-11-05: qty 10

## 2018-11-05 MED ORDER — ALBUTEROL SULFATE (2.5 MG/3ML) 0.083% IN NEBU: 3.0000 mL | INHALATION_SOLUTION | RESPIRATORY_TRACT | Status: DC | PRN

## 2018-11-05 MED ORDER — BUDESONIDE 0.25 MG/2ML IN SUSP
0.2500 mg | Freq: Two times a day (BID) | RESPIRATORY_TRACT | Status: DC
  Administered 2018-11-06 – 2018-11-08 (×5): 0.25 mg via RESPIRATORY_TRACT
  Filled 2018-11-05 (×5): qty 2

## 2018-11-05 MED ORDER — DILTIAZEM HCL ER COATED BEADS 120 MG PO CP24
120.0000 mg | ORAL_CAPSULE | Freq: Every day | ORAL | Status: DC
  Administered 2018-11-06 – 2018-11-08 (×3): 120 mg via ORAL
  Filled 2018-11-05 (×3): qty 1

## 2018-11-05 MED ORDER — IPRATROPIUM-ALBUTEROL 0.5-2.5 (3) MG/3ML IN SOLN
3.0000 mL | Freq: Once | RESPIRATORY_TRACT | Status: AC
  Administered 2018-11-05: 3 mL via RESPIRATORY_TRACT
  Filled 2018-11-05: qty 3

## 2018-11-05 MED ORDER — LORATADINE 10 MG PO TABS
10.0000 mg | ORAL_TABLET | Freq: Every day | ORAL | Status: DC
  Administered 2018-11-06 – 2018-11-08 (×3): 10 mg via ORAL
  Filled 2018-11-05 (×3): qty 1

## 2018-11-05 NOTE — H&P (Addendum)
[facility] Admission History and Physical Service Pager: [phone]  Patient name: Dean [name] Medical record number: [id number] Date of birth: Jan 31, 1924 Age: 85 y.o. Gender: male  Primary Care Provider: Angelina Sheriff, MD Consultants: none Code Status: partial  Chief Complaint: cough and SOB x2 days  Assessment and Plan: Dean [name] is a 85 y.o. male presenting with 2 days of worsening dry cough and SOB. PMH is significant for COPD on 2L O2 at night, Afib on coumadin, constipation, urinary retention, GERD, aortic valve replacement, gout, HTN, HLD, OA  Acute on chronic resp failure 2/2 COPD exacerbation- Has 2 out of 3 sx of COPD exacerbation as he presented with SOB and increased sputum production without purulence. He is usually on 2L O2 [place] at night but has been using during the day. Likely triggered by viral URI as patient has sick contact- wife who has had cough beginning a couple days prior to the patient and was treated with antibiotics by her PCP.  Patient was tachycardic, tachypnic, and had oxygen desat to 89% ora this admission. Was given trial of Bipap and patient stated he felt better afterwards. Given improvement was put on 4L O2 and satted >90% while in room. Audible wheezing from bedside, diffuse on auscultation worse with exhalation. Deep breaths caused significant dry coughing. Patient spoke in short sentences and had some accessory muscle use. Oro-pharynx negative erythema/edema/exudates. Negative tenderness to palpation to chest. Chest xray neg acute disease. S/p solumedrol, albuterol, mucinex, and duoneb treatments in ED.  Influenza was negative, respiratory panel pending, LA normal at 1.3, WBC 4.3. patient is up to date on vaccinations including flu and pneumonia. No CAD hx or chest pain, trop neg, but with increased DOE and BNP 164.1, consider cardiac contribution though less likely. - admit to tele, attending Dr. Ardelia Mems -  CTX/azithromycin daily (1/31-)  - s/p solumedrol, start prednisone daily (2/1-) - duonebs q6h scheduled - albuterol PRN - continue home meds: pulmicort BID, claritin instead of home allegra while admitted, flonase BID - mucinex - incentive spirometry - continuous pulse ox - f/u respiratory panel - CBC am - BMP am - f/u blood cultures - PT/OT consult - up with assistance  Afib/biosynthetic Aortic valve replacement, QTc prolongation- home meds: warfarin 3mg  qd, cardizem 120 daily. Poorly rate controlled HR>100 sustained. ECG on admission sinus tachycardia with QTc prolongation 507. - continue warfarin per pharm - continue cardizem - PT/INR - continuous cardiac monitoring - avoid QTc prolonging agents  H/o Ileus- home meds: amitizia BID PRN. Last BM 2 days ago. - continue home med - miralax BID  Urinary retention, h/o prostate CA- home meds: flomax - continue home med - strict I/Os  HTN- home meds: lopressor 25mg  BID. normotensive this admission - continue home meds - vitals per floor protocol   Macrocytic Anemia- Hgb 9.5, MCV 119.7, BL >10 - CBC with differential am  HLD- home meds: none, no lipid panel in chart. Primary prevention in 85yo not necessary  OA- home meds: oxycodone PRN. Patient states that he rarely takes this. He is scheduled for neck injections 2/7. - treat pain PRN  Chronically thrombocytic- platelets 859 on this admission - CBC with diff am  FEN/GI: heart Prophylaxis: warfarin  Disposition: cardiac-telemetry   History of Present Illness:  Dean [name] is a 85 y.o. male presenting with SOB and cough x2 days that has prevented patient from sleeping or performing every day activities. He states that cough is productive of  whitish sputum but no purulence. He also has been wheezing more in the last few days. He does does have any orthopnea or leg swelling or weigh gain. Patient has home oxygen of 2L to use at night but prior to this illness has  not used it for about 6 months. He regularly checks his O2 levels and they are around 94 usually. Since onset of this illness, patient has been using oxygen throughout day and night.At baseline, able to ambulate ~300 yards to mailbox and is now SOB with minimal exertion. His wife has had cough ~week and was treated with antibiotics by PCP. Patient received tessalon perles with no improvement of coughing.  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   Review Of Systems: Per HPI with the following additions:  Review of Systems  Constitutional: Positive for malaise/fatigue. Negative for chills and fever.  HENT: Negative for sinus pain and sore throat.   Respiratory: Positive for cough, shortness of breath and wheezing. Negative for hemoptysis and sputum production.   Cardiovascular: Negative for chest pain and leg swelling.  Gastrointestinal: Positive for constipation. Negative for abdominal pain, diarrhea, nausea and vomiting.  Genitourinary: Negative for frequency.  Musculoskeletal: Positive for neck pain. Negative for falls and myalgias.  Neurological: Positive for weakness. Negative for headaches.  Psychiatric/Behavioral: Negative for substance abuse. The patient has insomnia.     Patient Active Problem List   Diagnosis Date Noted  . COPD exacerbation ([place]) 11/05/2018  . Chest pain 01/10/2018  . Physical deconditioning 11/04/2011  . MRSA pneumonia ([place]) 10/16/2011  . Exudative pleural effusion 10/16/2011  . Dyspnea 10/16/2011  . Ileus ([place]) 10/16/2011  . Constipation 10/16/2011  . Atrial fibrillation with RVR ([place]) 10/16/2011  . Abdominal pain 10/16/2011  . Sepsis(995.91) 10/16/2011  . Obesity 10/16/2011  . GERD (gastroesophageal reflux disease) 10/16/2011  . Hyponatremia 10/16/2011  . Urinary retention 10/16/2011  .  Atelectasis 10/16/2011  . PAF (paroxysmal atrial fibrillation) ([place])   . Aortic valve disease   . Prostate cancer ([place])   . Environmental allergies   . Asthma   . Essential thrombocytopenia ([place])   . Neuropathy   . Insomnia   . Gout   . Hypertension   . Hyperlipidemia   . Osteoarthritis   . Hypoxia, sleep related     Past Medical History: Past Medical History:  Diagnosis Date  . Aortic stenosis    a. s/p TAVR in 08/2016; b. followed by [facility]  . Asthma   . CAD (coronary artery disease)    a. [place] 06/2016 with CTO of the RCA otherwise without obstructive CAD  . Environmental allergies   . Essential thrombocytopenia ([place])    followed by Dr Bobby Rumpf (hematology)  . Gout   . Hyperlipidemia   . Hypertension   . Hypoxia, sleep related   . Insomnia   . Neuropathy   . Obesity   . Osteoarthritis   . PAF (paroxysmal atrial fibrillation) ([place])    a. On Coumadin; b. followed by PCP; c. CHADS2VASc => 4 (HTN, age x 2, vascular disease)  . Prostate cancer [facility])    s/p seed implants    Past Surgical History: Past Surgical History:  Procedure Laterality Date  . CHOLECYSTECTOMY    . ESOPHAGOGASTRODUODENOSCOPY N/A 01/10/2018   Procedure: ESOPHAGOGASTRODUODENOSCOPY (EGD);  Surgeon: Carol Ada, MD;  Location: [facility];  Service: Endoscopy;  Laterality: N/A;  . EYE SURGERY    .  POLYPECTOMY    . TESTICLE SURGERY    . TONSILLECTOMY    . TUMOR EXCISION     on back    Social History: Social History   Tobacco Use  . Smoking status: Never Smoker  . Smokeless tobacco: Never Used  Substance Use Topics  . Alcohol use: Never    Frequency: Never  . Drug use: Never   Additional social history: Lives with wife. At baseline can walk 317ft to mailbox.   Please also refer to relevant sections of EMR.  Family History: Family History  Problem Relation Age of Onset  . Asthma Maternal Grandmother   . Tuberculosis Maternal Grandmother   . Heart disease Father        died of heart  disease  . Alzheimer's disease Mother     Allergies and Medications: No Known Allergies No current facility-administered medications on file prior to encounter.    Current Outpatient Medications on File Prior to Encounter  Medication Sig Dispense Refill  . acetaminophen (TYLENOL) 500 MG tablet Take 500 mg by mouth every 6 (six) hours as needed for headache (pain).     Marland Kitchen albuterol (PROVENTIL) (2.5 MG/3ML) 0.083% nebulizer solution Take 2.5 mg by nebulization 2 (two) times daily.    . calcitRIOL (ROCALTROL) 0.25 MCG capsule Take 0.25 mcg by mouth every other day.     . Calcium Carbonate Antacid (TUMS PO) Take 2 tablets by mouth daily as needed (heartburn/indigestion).    Marland Kitchen diltiazem (CARDIZEM CD) 120 MG 24 hr capsule Take 120 mg by mouth daily.    . fexofenadine (ALLEGRA) 180 MG tablet Take 180 mg by mouth daily as needed for allergies.     . fluticasone (FLONASE) 50 MCG/ACT nasal spray Place 1 spray into both nostrils 2 (two) times daily.    Marland Kitchen gabapentin (NEURONTIN) 300 MG capsule Take 300 mg by mouth at bedtime.     Marland Kitchen guaiFENesin (MUCINEX) 600 MG 12 hr tablet Take 600 mg by mouth 2 (two) times daily.     . hydroxyurea (HYDREA) 500 MG capsule Take 500 mg by mouth 2 (two) times daily.     Marland Kitchen lubiprostone (AMITIZA) 24 MCG capsule Take 24 mcg by mouth 2 (two) times daily as needed for constipation.    . metoprolol tartrate (LOPRESSOR) 25 MG tablet Take 25 mg by mouth 2 (two) times daily.     . Tamsulosin HCl (FLOMAX) 0.4 MG CAPS Take 0.4 mg by mouth 3 times/day as needed-between meals & bedtime.     Marland Kitchen warfarin (COUMADIN) 3 MG tablet Take 1/2 tablet (1.5 mg) by mouth with 1/2 1 mg tablet daily at bedtime for a total daily dose of 2.5 mg for 6 days while on fluconazole.  After this 6 day course, please take your regular dose of 1 tablet by mouth with 1/2 of 1mg  tablet daily at bedtime for a total daily dose of 3.5mg . (Patient taking differently: Take 3 mg by mouth at bedtime. )    . budesonide  (PULMICORT) 0.25 MG/2ML nebulizer solution Take 0.25 mg by nebulization 2 (two) times daily.    Marland Kitchen dextromethorphan-guaiFENesin (MUCINEX DM) 30-600 MG 12hr tablet Take 1 tablet by mouth 2 (two) times daily. 30 tablet 0  . montelukast (SINGULAIR) 10 MG tablet Take 1 tablet (10 mg total) by mouth at bedtime. (Patient not taking: Reported on 11/05/2018) 30 tablet 0    Objective: BP 113/67   Pulse (!) 114   Temp 99.4 F (37.4 C) (Oral)   Resp Marland Kitchen)  29   Ht 5\' 8"  (1.727 m)   Wt 77.1 kg   SpO2 94%   BMI 25.85 kg/m  Exam: General: resting comfortably, pleasant, no acute distress Eyes: arcus senilis present, PERRLA ENTM: moist mucous membranes Neck: soft, cervical spine tender to palpation Cardiovascular: tachycardic, no murmur appreciated, 2+ peripheral pulses, neg LE edema Respiratory: wheezing diffusely, worse with exhalation and heard without stethoscope, accessory muscle use Gastrointestinal: abdomen non-distended, mildly tender diffusely, no guarding or rigidity, +BS diffusely  MSK: normal development and tone Derm:  Scattered seborrheic keratoses, warm and dry Neuro: alert and oriented, no focal deficits Psych: normal affect and mood, pleasant  Labs and Imaging: CBC BMET  Recent Labs  Lab 11/05/18 1632  WBC 4.3  HGB 9.5*  HCT 30.4*  PLT 859*   Recent Labs  Lab 11/05/18 1632  NA 139  K 4.8  CL 106  CO2 24  BUN 28*  CREATININE 1.49*  GLUCOSE 129*  CALCIUM 8.8*    BNP 164.1 Hepatic function panel normal LA 1.3 Troponin istat 0.02  Flu neg RVP pending Blood cultures pending  Dg Chest Portable 1 View  Result Date: 11/05/2018 CLINICAL DATA:  Respiratory distress.  Productive cough. EXAM: PORTABLE CHEST 1 VIEW COMPARISON:  PA and lateral chest 01/12/2018.  CT chest 10/19/2017. FINDINGS: Mild elevation of the right hemidiaphragm and scar at the right costophrenic angle are unchanged. No consolidative process, pneumothorax or effusion. Heart size is normal. Aortic  atherosclerosis is seen. Pacing device is in place. IMPRESSION: No acute disease. Atherosclerosis. Electronically Signed   By: Inge Rise M.D.   On: 11/05/2018 17:48    Richarda Osmond, DO 11/05/2018, 9:10 PM PGY-1, [place] Intern pager: [phone], text pages welcome  FPTS Upper-Level Resident Addendum  I have independently interviewed and examined the patient. I have discussed the above with the original author and agree with their documentation. My edits for correction/addition/clarification are in blue. Please see also any attending notes.   Bufford Lope, DO PGY-3, [facility] 11/05/2018 11:44 PM  FPTS Service pager: [phone] (text pages welcome through [place])

## 2018-11-05 NOTE — Progress Notes (Signed)
Placed pt on BiPAP per order, pt tolerated well.

## 2018-11-05 NOTE — ED Provider Notes (Signed)
5:40 PM patient states breathing is somewhat worse  He speaks in sentences..  BiPAP ordered.  Did not tolerate BiPAP.  9:15 PM patient is resting comfortably.  He states his breathing is improved after additional nebulized treatments and IV antibiotics. X-ray viewed by me CRITICAL CARE Performed by: Orlie Dakin Total critical care time: 30 minutes Critical care time was exclusive of separately billable procedures and treating other patients. Critical care was necessary to treat or prevent imminent or life-threatening deterioration. Critical care was time spent personally by me on the following activities: development of treatment plan with patient and/or surrogate as well as nursing, discussions with consultants, evaluation of patient's response to treatment, examination of patient, obtaining history from patient or surrogate, ordering and performing treatments and interventions, ordering and review of laboratory studies, ordering and review of radiographic studies, pulse oximetry and re-evaluation of patient's condition.   Orlie Dakin, MD 11/05/18 2117

## 2018-11-05 NOTE — ED Notes (Signed)
Per previous RN, initial dose of zithromax had faulty container and thus not given.

## 2018-11-05 NOTE — ED Provider Notes (Signed)
Complains of increased shortness of breath and cough productive of clear sputum worsening over the past 2 days.  He denies any fever.Marland Kitchen  He was seen by his primary care provider today reportedly had chest x-ray questionable for pneumonia.  Sent here for further evaluation.  Exam speaks in sentences, coughing frequently.  Lungs with prolonged expiratory phase with expiratory wheezes   Orlie Dakin, MD 11/05/18 1701

## 2018-11-05 NOTE — ED Notes (Signed)
No improvement with breathing tx.  MD notified.  Radiology called for bedside x-ray.

## 2018-11-05 NOTE — ED Provider Notes (Signed)
[name] Provider Note   CSN: [id number] Arrival date & time: 11/05/18  1556     History   Chief Complaint Chief Complaint  Patient presents with  . Shortness of Breath    HPI [place] is a 85 y.o. male.  HPI   Maxxon Schwanke Degraaf is a 85 y.o. male with PMH of aortic stenosis status post TAVR, asthma, CAD, essential thrombocytopenia, gout, HLD, HTN, nocturnal hypoxia, osteoarthritis, paroxysmal A. fib on cardizem and metoprolol and coumadin, prostate cancer status post seed implants who presents with concern for pneumonia from his primary care doctor's office.  The patient has had 2 to 3 days of worsening dyspnea and cough that is productive of clear but stringy and thick sputum.  Sputum production is increased.  Cough frequency has increased.  Appetite is normal.  No fevers or chills at home.  Wife reportedly sick recently and treated with multiple course of antibiotics for respiratory illness.  No chest pain.  No orthopnea or PND.  Abdomen feels slightly full and slightly constipated but is passing flatus.  No lower extremity edema.  No recent falls or trauma.  He is more short of breath and uses oxygen only intermittently at home mostly at night.  Has not used it in the last couple of days until earlier today which did improve his dyspnea.  Using albuterol at home.  Saw his primary care doctor with concern for pneumonia on x-ray and given albuterol prior to transfer.  Denies receiving antibiotics or steroids today.  Has been wheezing more the last 1 to 2 days.  Past Medical History:  Diagnosis Date  . Aortic stenosis    a. s/p TAVR in 08/2016; b. followed by [facility]  . Asthma   . CAD (coronary artery disease)    a. [place] 06/2016 with CTO of the RCA otherwise without obstructive CAD  . Environmental allergies   . Essential thrombocytopenia ([place])    followed by Dr Bobby Rumpf (hematology)  . Gout   . Hyperlipidemia   . Hypertension   .  Hypoxia, sleep related   . Insomnia   . Neuropathy   . Obesity   . Osteoarthritis   . PAF (paroxysmal atrial fibrillation) ([place])    a. On Coumadin; b. followed by PCP; c. CHADS2VASc => 4 (HTN, age x 2, vascular disease)  . Prostate cancer [facility])    s/p seed implants    Patient Active Problem List   Diagnosis Date Noted  . COPD exacerbation ([place]) 11/05/2018  . Chest pain 01/10/2018  . Physical deconditioning 11/04/2011  . MRSA pneumonia ([place]) 10/16/2011  . Exudative pleural effusion 10/16/2011  . Dyspnea 10/16/2011  . Ileus ([place]) 10/16/2011  . Constipation 10/16/2011  . Atrial fibrillation with RVR ([place]) 10/16/2011  . Abdominal pain 10/16/2011  . Sepsis(995.91) 10/16/2011  . Obesity 10/16/2011  . GERD (gastroesophageal reflux disease) 10/16/2011  . Hyponatremia 10/16/2011  . Urinary retention 10/16/2011  . Atelectasis 10/16/2011  . PAF (paroxysmal atrial fibrillation) ([place])   . Aortic valve disease   . Prostate cancer ([place])   . Environmental allergies   . Asthma   . Essential thrombocytopenia ([place])   . Neuropathy   . Insomnia   . Gout   . Hypertension   . Hyperlipidemia   . Osteoarthritis   . Hypoxia, sleep related     Past Surgical History:  Procedure Laterality Date  . CHOLECYSTECTOMY    . ESOPHAGOGASTRODUODENOSCOPY N/A 01/10/2018   Procedure: ESOPHAGOGASTRODUODENOSCOPY (EGD);  Surgeon: Carol Ada, MD;  Location: [place];  Service: Endoscopy;  Laterality: N/A;  . EYE SURGERY    . POLYPECTOMY    . TESTICLE SURGERY    . TONSILLECTOMY    . TUMOR EXCISION     on back        Home Medications    Prior to Admission medications   Medication Sig Start Date End Date Taking? Authorizing Provider  albuterol (PROVENTIL) (2.5 MG/3ML) 0.083% nebulizer solution Take 2.5 mg by nebulization 2 (two) times daily.   Yes [provider]  diltiazem (CARDIZEM CD) 120 MG 24 hr capsule Take 120 mg by mouth daily. 11/05/17  Yes [provider]    fexofenadine (ALLEGRA) 180 MG tablet Take 180 mg by mouth daily as needed for allergies.    Yes [provider]  guaiFENesin (MUCINEX) 600 MG 12 hr tablet Take 600 mg by mouth 2 (two) times daily.    Yes [provider]  lubiprostone (AMITIZA) 24 MCG capsule Take 24 mcg by mouth 2 (two) times daily as needed for constipation.   Yes [provider]  metoprolol tartrate (LOPRESSOR) 25 MG tablet Take 12.5 mg by mouth 2 (two) times daily.  10/13/17  Yes [provider]  nitroGLYCERIN (NITROSTAT) 0.4 MG SL tablet Place 0.4 mg under the tongue every 5 (five) minutes as needed for chest pain.  11/10/17  Yes [provider]  warfarin (COUMADIN) 3 MG tablet Take 1/2 tablet (1.5 mg) by mouth with 1/2 1 mg tablet daily at bedtime for a total daily dose of 2.5 mg for 6 days while on fluconazole.  After this 6 day course, please take your regular dose of 1 tablet by mouth with 1/2 of 1mg  tablet daily at bedtime for a total daily dose of 3.5mg . Patient taking differently: Take 3 mg by mouth at bedtime.  01/13/18  Yes Katherine Roan, MD  acetaminophen (TYLENOL) 500 MG tablet Take 500 mg by mouth every 6 (six) hours as needed for headache (pain).     [provider]  alendronate (FOSAMAX) 70 MG tablet Take 70 mg by mouth once a week. Do not eat, drink, lay down or take any other medications within 30 minutes of taking this medication 07/23/18   [provider]  budesonide (PULMICORT) 0.25 MG/2ML nebulizer solution Take 0.25 mg by nebulization 2 (two) times daily.    [provider]  calcitRIOL (ROCALTROL) 0.25 MCG capsule Take 0.25 mcg by mouth.  10/13/17   [provider]  Calcium Carbonate Antacid (TUMS PO) Take 2 tablets by mouth daily as needed (heartburn/indigestion).    [provider]  dextromethorphan-guaiFENesin (MUCINEX DM) 30-600 MG 12hr tablet Take 1 tablet by mouth 2 (two) times daily. 01/13/18   Katherine Roan, MD   fluticasone (FLONASE) 50 MCG/ACT nasal spray Place 1 spray into both nostrils 2 (two) times daily.    [provider]  furosemide (LASIX) 20 MG tablet Take 20 mg by mouth daily as needed for fluid or edema.  12/23/17   [provider]  gabapentin (NEURONTIN) 300 MG capsule Take 300 mg by mouth daily. 10/27/18   [provider]  HYDROcodone-acetaminophen (NORCO) 10-325 MG tablet Take 1 tablet by mouth every 6 (six) hours as needed (pain).    [provider]  HYDROcodone-acetaminophen (NORCO/VICODIN) 5-325 MG tablet Take 1 tablet by mouth every 6 (six) hours as needed for moderate pain.    [provider]  hydroxyurea (HYDREA) 500 MG capsule  Take 500 mg by mouth See admin instructions. Take one capsule (500 mg) by mouth every morning, take one capsule (500 mg) every Monday, Wednesday, Friday night. May take with food to minimize GI side effects.    [provider]  montelukast (SINGULAIR) 10 MG tablet Take 1 tablet (10 mg total) by mouth at bedtime. 01/13/18   Katherine Roan, MD  nystatin (MYCOSTATIN) 100000 UNIT/ML suspension Take 5 mLs by mouth See admin instructions. Swish and swallow 5 mls by mouth 4 times daily for 5 days - filled 10/22/2018 10/22/18   [provider]  oxyCODONE-acetaminophen (PERCOCET/ROXICET) 5-325 MG tablet Take 0.5 tablets by mouth daily as needed (neck pain).  11/08/13   [provider]  Polyvinyl Alcohol-Povidone (REFRESH OP) Place 1 drop into both eyes daily as needed (dry eyes).    [provider]  ranitidine (ZANTAC) 150 MG tablet Take 150 mg by mouth 2 (two) times daily as needed for heartburn (gas).    [provider]  Tamsulosin HCl (FLOMAX) 0.4 MG CAPS Take 0.4 mg by mouth.     [provider]    Family History Family History  Problem Relation Age of Onset  . Asthma Maternal Grandmother   . Tuberculosis Maternal Grandmother   . Heart disease Father        died of  heart disease  . Alzheimer's disease Mother     Social History Social History   Tobacco Use  . Smoking status: Never Smoker  . Smokeless tobacco: Never Used  Substance Use Topics  . Alcohol use: Never    Frequency: Never  . Drug use: Never     Allergies   Patient has no known allergies.   Review of Systems Review of Systems  Constitutional: Positive for activity change. Negative for appetite change, chills, fatigue and fever.  HENT: Negative for ear pain and sore throat.   Eyes: Negative for pain and visual disturbance.  Respiratory: Positive for cough, shortness of breath and wheezing.   Cardiovascular: Negative for chest pain and palpitations.  Gastrointestinal: Positive for constipation. Negative for abdominal pain and vomiting.  Genitourinary: Negative for dysuria and hematuria.  Musculoskeletal: Negative for arthralgias and back pain.  Skin: Negative for color change and rash.  Neurological: Negative for seizures and syncope.  All other systems reviewed and are negative.    Physical Exam Updated Vital Signs BP 113/67   Pulse (!) 126   Temp 99.4 F (37.4 C) (Oral)   Resp (!) 22   Ht 5\' 8"  (1.727 m)   Wt 77.1 kg   SpO2 94%   BMI 25.85 kg/m   Physical Exam Vitals signs and nursing note reviewed.  Constitutional:      Appearance: Normal appearance. He is well-developed. He is ill-appearing. He is not diaphoretic.  HENT:     Head: Normocephalic and atraumatic.  Eyes:     Conjunctiva/sclera: Conjunctivae normal.  Neck:     Musculoskeletal: Neck supple.  Cardiovascular:     Rate and Rhythm: Regular rhythm. Tachycardia present.     Heart sounds: S1 normal and S2 normal. No murmur.  Pulmonary:     Effort: Pulmonary effort is normal. Tachypnea present. No respiratory distress.     Breath sounds: Examination of the left-middle field reveals rhonchi. Examination of the left-lower field reveals wheezing and rhonchi. Wheezing and rhonchi present.  Abdominal:      Palpations: Abdomen is soft.     Tenderness: There is no abdominal tenderness.  Skin:  General: Skin is warm and dry.  Neurological:     Mental Status: He is alert.  Psychiatric:        Behavior: Behavior is cooperative.     ED Treatments / Results  Labs (all labs ordered are listed, but only abnormal results are displayed) Labs Reviewed  CBC - Abnormal; Notable for the following components:      Result Value   RBC 2.54 (*)    Hemoglobin 9.5 (*)    HCT 30.4 (*)    MCV 119.7 (*)    MCH 37.4 (*)    Platelets 859 (*)    All other components within normal limits  BASIC METABOLIC PANEL - Abnormal; Notable for the following components:   Glucose, Bld 129 (*)    BUN 28 (*)    Creatinine, Ser 1.49 (*)    Calcium 8.8 (*)    GFR calc non Af Amer 40 (*)    GFR calc Af Amer 47 (*)    All other components within normal limits  BRAIN NATRIURETIC PEPTIDE - Abnormal; Notable for the following components:   B Natriuretic Peptide 164.1 (*)    All other components within normal limits  CULTURE, BLOOD (ROUTINE X 2)  CULTURE, BLOOD (ROUTINE X 2)  RESPIRATORY PANEL BY PCR  HEPATIC FUNCTION PANEL  LACTIC ACID, PLASMA  INFLUENZA PANEL BY PCR (TYPE A & B)  LACTIC ACID, PLASMA  I-STAT TROPONIN, ED    EKG EKG Interpretation  Date/Time:  Friday November 05 2018 16:07:29 EST Ventricular Rate:  105 PR Interval:  196 QRS Duration: 116 QT Interval:  384 QTC Calculation: 507 R Axis:   -3 Text Interpretation:  Sinus tachycardia with Fusion complexes Left ventricular hypertrophy with QRS widening Marked ST abnormality, possible inferior subendocardial injury Abnormal ECG No significant change since last tracing Confirmed by Orlie Dakin [phone]) on 11/05/2018 4:56:13 PM   Radiology Dg Chest Portable 1 View  Result Date: 11/05/2018 CLINICAL DATA:  Respiratory distress.  Productive cough. EXAM: PORTABLE CHEST 1 VIEW COMPARISON:  PA and lateral chest 01/12/2018.  CT chest 10/19/2017.  FINDINGS: Mild elevation of the right hemidiaphragm and scar at the right costophrenic angle are unchanged. No consolidative process, pneumothorax or effusion. Heart size is normal. Aortic atherosclerosis is seen. Pacing device is in place. IMPRESSION: No acute disease. Atherosclerosis. Electronically Signed   By: Inge Rise M.D.   On: 11/05/2018 17:48    Procedures Procedures (including critical care time)  Medications Ordered in ED Medications  azithromycin (ZITHROMAX) 500 mg in sodium chloride 0.9 % 250 mL IVPB (has no administration in time range)  azithromycin (ZITHROMAX) 500 mg in sodium chloride 0.9 % 250 mL IVPB (500 mg Intravenous New Bag/Given 11/05/18 1922)  ipratropium-albuterol (DUONEB) 0.5-2.5 (3) MG/3ML nebulizer solution 3 mL (3 mLs Nebulization Given 11/05/18 1711)  methylPREDNISolone sodium succinate (SOLU-MEDROL) 125 mg/2 mL injection 125 mg (125 mg Intravenous Given 11/05/18 1732)  cefTRIAXone (ROCEPHIN) 1 g in sodium chloride 0.9 % 100 mL IVPB (0 g Intravenous Stopped 11/05/18 1915)  azithromycin (ZITHROMAX) 500 mg in sodium chloride 0.9 % 250 mL IVPB ( Intravenous Canceled Entry 11/05/18 1925)  albuterol (PROVENTIL,VENTOLIN) solution continuous neb (10 mg/hr Nebulization Given 11/05/18 1825)     Initial Impression / Assessment and Plan / ED Course  I have reviewed the triage vital signs and the nursing notes.  Pertinent labs & imaging results that were available during my care of the patient were reviewed by me and considered in my medical decision  making (see chart for details).     MDM:  Imaging: Chest x-ray shows no evidence for acute pathology.  Minimal increase in interstitial lines.  No pleural effusions or gross evidence for edema.  No consolidation consistent with pneumonia.  ED Provider Interpretation of EKG: Poor quality EKG.  What appears to be sinus tachycardia with a rate of 105 bpm, left axis deviation, no ST segment elevation or depression,  Labs:  Troponin 0.02 , BMP with BUN 28 and creatinine 1.49 (baseline around 1.3-1.4),Hemoglobin 9.5 with baseline of around 10-12, white count 4.3, platelets 859, lactic acid 1.3, hepatic function panel normal, BNP 164, flu negative  On initial evaluation, patient appears ill. Afebrile and hemodynamically stable although tachycardic and with mild tachypnea. Alert and oriented x4, pleasant, and cooperative.  Presents with dyspnea and cough as above.  On exam, patient has mild increased work of breathing and low-grade tachycardia in the low 100s that had improved to the 90s on my evaluation.  Slight increased work of breathing with wheezes as documented above.  Cough noted.  No stridor/stertor. CXR reportedly concerning for PNA at PCP's office. Given duoneb PTA with some improvement. Repeated duoneb here. Given 125 IV solumedrol and repeat CXR ordered. Given Rocephin and Azithro IV for CAP with concern for CAP vs. COPD exacerbation. Blood cultures collected.   EKG shows no evidence for acute ischemia or significant arrhythmia. Trop negative. Doubt ACS.  No evidence for peritonitis or myocarditis BNP not elevated. No sig evidence for volume overload on exam.  Last echo April 2019 showing LVEF of 60 to 65% with grade 1 diastolic dysfunction and functionally normal TAVR bioprosthesis.  However, after patient received DuoNeb on reassessment around 5:30 PM he was more tachypneic in the mid to high 20s.  More prolonged expiration.  More short of breath.  Satting 96 to 97% on 2 L nasal cannula.  Felt slightly more tired at that time.  Mental status unchanged.  Placed on BiPAP with no significant change after about 1 hour.  Did receive 10 mg of albuterol continuous at that time.  He did not feel the BiPAP helped much but did think the neb helped.  Taken off of BiPAP and placed back on cannula with some improvement.  Labs otherwise largely unremarkable.  Patient mid to the family medicine teaching service in improved  condition for acute approximate respiratory failure in the setting of what is most likely a COPD exacerbation.  The plan for this patient was discussed with Dr. Winfred Leeds who voiced agreement and who oversaw evaluation and treatment of this patient.   The patient was fully informed and involved with the history taking, evaluation, workup including labs/images, and plan. The patient's concerns and questions were addressed to the patient's satisfaction and he expressed agreement with the plan to admit.    Final Clinical Impressions(s) / ED Diagnoses   Final diagnoses:  COPD exacerbation ([place])  Shortness of breath  Acute hypoxemic respiratory failure [facility]    ED Discharge Orders    None       Detrick Dani, Rodena Goldmann, MD 11/05/18 2009    Orlie Dakin, MD 11/06/18 [phone]

## 2018-11-05 NOTE — Progress Notes (Signed)
RT came to assess patient. MD has taken patient on BIPAP. Patient is tolerating 4L [place] well at this time. RT instructed patient to call if he gets SOB again.

## 2018-11-05 NOTE — ED Notes (Signed)
Pharmacy in pt room at this moment

## 2018-11-05 NOTE — ED Triage Notes (Signed)
Pt sent by PCP for concern for pneumonia, pt reported to be coughing with clear sputum, pt O2 sats low on RA per report called by PCP (90%), pt denies CP, n/v/d, A&O x4

## 2018-11-05 NOTE — ED Triage Notes (Signed)
Pt reports using 2 L [place] prn at home, pt has [employer] pacemaker

## 2018-11-06 ENCOUNTER — Other Ambulatory Visit: Payer: Self-pay

## 2018-11-06 DIAGNOSIS — J441 Chronic obstructive pulmonary disease with (acute) exacerbation: Principal | ICD-10-CM

## 2018-11-06 LAB — CBC WITH DIFFERENTIAL/PLATELET
Abs Immature Granulocytes: 0.04 10*3/uL (ref 0.00–0.07)
BASOS PCT: 0 %
Basophils Absolute: 0 10*3/uL (ref 0.0–0.1)
Eosinophils Absolute: 0 10*3/uL (ref 0.0–0.5)
Eosinophils Relative: 0 %
HCT: 25.9 % — ABNORMAL LOW (ref 39.0–52.0)
Hemoglobin: 8.1 g/dL — ABNORMAL LOW (ref 13.0–17.0)
Immature Granulocytes: 2 %
Lymphocytes Relative: 6 %
Lymphs Abs: 0.1 10*3/uL — ABNORMAL LOW (ref 0.7–4.0)
MCH: 37.2 pg — ABNORMAL HIGH (ref 26.0–34.0)
MCHC: 31.3 g/dL (ref 30.0–36.0)
MCV: 118.8 fL — ABNORMAL HIGH (ref 80.0–100.0)
Monocytes Absolute: 0.1 10*3/uL (ref 0.1–1.0)
Monocytes Relative: 4 %
NEUTROS ABS: 1.6 10*3/uL — AB (ref 1.7–7.7)
Neutrophils Relative %: 88 %
PLATELETS: 645 10*3/uL — AB (ref 150–400)
RBC: 2.18 MIL/uL — ABNORMAL LOW (ref 4.22–5.81)
RDW: 13.6 % (ref 11.5–15.5)
WBC: 1.8 10*3/uL — ABNORMAL LOW (ref 4.0–10.5)
nRBC: 0 % (ref 0.0–0.2)

## 2018-11-06 LAB — BASIC METABOLIC PANEL
Anion gap: 11 (ref 5–15)
BUN: 32 mg/dL — ABNORMAL HIGH (ref 8–23)
CO2: 20 mmol/L — ABNORMAL LOW (ref 22–32)
Calcium: 8.2 mg/dL — ABNORMAL LOW (ref 8.9–10.3)
Chloride: 108 mmol/L (ref 98–111)
Creatinine, Ser: 1.82 mg/dL — ABNORMAL HIGH (ref 0.61–1.24)
GFR calc Af Amer: 37 mL/min — ABNORMAL LOW (ref >60)
GFR, EST NON AFRICAN AMERICAN: 32 mL/min — AB (ref >60)
Glucose, Bld: 309 mg/dL — ABNORMAL HIGH (ref 70–99)
POTASSIUM: 4 mmol/L (ref 3.5–5.1)
Sodium: 139 mmol/L (ref 135–145)

## 2018-11-06 LAB — MRSA PCR SCREENING: MRSA by PCR: NEGATIVE

## 2018-11-06 LAB — RESPIRATORY PANEL BY PCR
Adenovirus: NOT DETECTED
Bordetella pertussis: NOT DETECTED
CHLAMYDOPHILA PNEUMONIAE-RVPPCR: NOT DETECTED
Coronavirus 229E: NOT DETECTED
Coronavirus HKU1: NOT DETECTED
Coronavirus NL63: NOT DETECTED
Coronavirus OC43: NOT DETECTED
Influenza A: NOT DETECTED
Influenza B: NOT DETECTED
Metapneumovirus: NOT DETECTED
Mycoplasma pneumoniae: NOT DETECTED
PARAINFLUENZA VIRUS 3-RVPPCR: NOT DETECTED
Parainfluenza Virus 1: NOT DETECTED
Parainfluenza Virus 2: NOT DETECTED
Parainfluenza Virus 4: NOT DETECTED
Respiratory Syncytial Virus: DETECTED — AB
Rhinovirus / Enterovirus: NOT DETECTED

## 2018-11-06 LAB — PROTIME-INR
INR: 2.13
Prothrombin Time: 23.5 seconds — ABNORMAL HIGH (ref 11.4–15.2)

## 2018-11-06 MED ORDER — WARFARIN - PHYSICIAN DOSING INPATIENT
Freq: Every day | Status: DC
  Administered 2018-11-06: 22:00:00

## 2018-11-06 NOTE — Progress Notes (Signed)
CRITICAL VALUE ALERT  Critical Value:  + Ve for RSV  Date & Time Notied:  11/06/18 @ 0340  Provider Notified:  Dr  Doristine Mango  Orders Received/Actions taken:    No new orders , pt already on droplet isolation

## 2018-11-06 NOTE — Plan of Care (Signed)
  Problem: Safety: Goal: Ability to remain free from injury will improve Outcome: Progressing   

## 2018-11-06 NOTE — Evaluation (Signed)
Occupational Therapy Evaluation Patient Details Name: [name] MRN: [id number] DOB: [birth year] Today's Date: 11/06/2018    History of Present Illness 85 y.o. male presenting with worsening dry cough and SOB. PMH is significant for COPD on 2L O2 at night, Afib on coumadin, constipation, urinary retention, GERD, aortic valve replacement, gout, HTN, HLD, OA   Clinical Impression   PTA, pt was living with his wife and daughter and was independent; wears home 2L O2 PRN. Pt currently requiring Min A for LB ADLs and Min Guard A for functional mobility with RW. Pt presenting with dry cough during activity and decreased activity tolerance as seen by fatigue and decreased SpO2.  During functional mobility, pt with one episode of SpO2 dropping to 87%; elevated O2 to 4L. With rest breaks and purse lip breathing, pt able to maintain SpO2 in 90s on 3L for remainder of session. Pt would benefit from further acute OT to facilitate safe dc. Recommend dc to home with HHOT for further OT to optimize safety, independence with ADLs, and return to PLOF.      Follow Up Recommendations  Home health OT;Supervision/Assistance - 24 hour    Equipment Recommendations  None recommended by OT    Recommendations for Other Services PT consult     Precautions / Restrictions Precautions Precautions: Fall;Other (comment)(Watch 02) Restrictions Weight Bearing Restrictions: No      Mobility Bed Mobility Overal bed mobility: Needs Assistance Bed Mobility: Supine to Sit     Supine to sit: Min guard;HOB elevated     General bed mobility comments: Min Guard A for safety and increased time  Transfers Overall transfer level: Needs assistance Equipment used: Rolling walker (2 wheeled) Transfers: Sit to/from Stand Sit to Stand: Min guard;From elevated surface         General transfer comment: Min Guard A for safety    Balance Overall balance assessment: Needs assistance Sitting-balance support: No  upper extremity supported;Feet supported Sitting balance-Leahy Scale: Fair     Standing balance support: No upper extremity supported;During functional activity Standing balance-Leahy Scale: Fair Standing balance comment: Benefits from [employer] A for safety                           ADL either performed or assessed with clinical judgement   ADL Overall ADL's : Needs assistance/impaired Eating/Feeding: Set up;Supervision/ safety;Sitting   Grooming: Brushing hair;Min guard;Standing Grooming Details (indicate cue type and reason): Min Guard A for safety Upper Body Bathing: Min guard;Sitting   Lower Body Bathing: Minimal assistance   Upper Body Dressing : Min guard;Sitting   Lower Body Dressing: Minimal assistance;Sit to/from stand   Toilet Transfer: Min guard;Ambulation;RW(simulated to recliner) Armed forces technical officer Details (indicate cue type and reason): Min Guard A for safety         Functional mobility during ADLs: Min guard;Rolling walker General ADL Comments: Pt motivated to participate in therapy. Decreased activity tolerance and endurance. Dry cough during activity     Vision         Perception     Praxis      Pertinent Vitals/Pain Pain Assessment: No/denies pain Pain Intervention(s): Monitored during session     Hand Dominance Right   Extremity/Trunk Assessment Upper Extremity Assessment Upper Extremity Assessment: Generalized weakness(Limited ROM at shoulders)   Lower Extremity Assessment Lower Extremity Assessment: Defer to PT evaluation   Cervical / Trunk Assessment Cervical / Trunk Assessment: Kyphotic   Communication Communication Communication: [name]  Cognition Arousal/Alertness: Awake/alert Behavior During Therapy: WFL for tasks assessed/performed Overall Cognitive Status: Within Functional Limits for tasks assessed                                     General Comments  SpO2 in 90s on 3L O2; dropping at one point  during mobility to 87% and elevated to 4L. With rest breaks and purse lip breathing, pt abel to resume 3L O2    Exercises     Shoulder Instructions      Home Living Family/patient expects to be discharged to:: Private residence Living Arrangements: Spouse/significant other;Children Available Help at Discharge: Family;Available 24 hours/day Type of Home: House Home Access: Level entry     Home Layout: One level     Bathroom Shower/Tub: Occupational psychologist: Handicapped height     Home Equipment: Clinical cytogeneticist - 2 wheels;Cane - single point          Prior Functioning/Environment Level of Independence: Independent        Comments: uses 2L of O2 PRN        OT Problem List: Decreased activity tolerance;Impaired balance (sitting and/or standing);Decreased knowledge of use of DME or AE;Decreased knowledge of precautions      OT Treatment/Interventions: Self-care/ADL training;Therapeutic exercise;Energy conservation;DME and/or AE instruction;Therapeutic activities;Patient/family education    OT Goals(Current goals can be found in the care plan section) Acute Rehab OT Goals Patient Stated Goal: "Go home" OT Goal Formulation: With patient Time For Goal Achievement: 11/20/18 Potential to Achieve Goals: Good  OT Frequency: Min 2X/week   Barriers to D/C:            Co-evaluation              AM-PAC OT "6 Clicks" Daily Activity     Outcome Measure Help from another person eating meals?: None Help from another person taking care of personal grooming?: A Little Help from another person toileting, which includes using toliet, bedpan, or urinal?: A Little Help from another person bathing (including washing, rinsing, drying)?: A Little Help from another person to put on and taking off regular upper body clothing?: A Little Help from another person to put on and taking off regular lower body clothing?: A Little 6 Click Score: 19   End of Session  Equipment Utilized During Treatment: Gait belt;Rolling walker;Oxygen Nurse Communication: Mobility status;Other (comment)(Pt wanting to use room phone that isn't working)  Activity Tolerance: Patient tolerated treatment well Patient left: in chair;with call bell/phone within reach;with chair alarm set  OT Visit Diagnosis: Unsteadiness on feet (R26.81);Other abnormalities of gait and mobility (R26.89);Muscle weakness (generalized) (M62.81)                Time: 0350-0938 OT Time Calculation (min): 27 min Charges:  OT General Charges $OT Visit: 1 Visit OT Evaluation $OT Eval Moderate Complexity: [place], OTR/L Acute Rehab Pager: [phone] Office: [place] 11/06/2018, 12:13 PM

## 2018-11-06 NOTE — Evaluation (Addendum)
Physical Therapy Evaluation Patient Details Name: Dean [name] MRN: [id number] DOB: [birth year] Today's Date: 11/06/2018   History of Present Illness  Pt is a 85 y.o. male presenting with worsening dry cough and SOB. Pt found to have an acute COPD exacerbation and positive for RSV. PMH is significant for COPD on 2L O2 at night, Afib on coumadin, constipation, urinary retention, GERD, aortic valve replacement, gout, HTN, HLD, OA    Clinical Impression  Pt presented supine in bed with HOB elevated, awake and willing to participate in therapy session. Prior to admission, pt reported that he was independent with all functional mobility and ADLs. Pt lives with his wife and daughter in a single level home with a level entry. Pt currently at min guard level for all functional mobility. During ambulation, pt on 3L of O2 initially with desat to 87%. Increased O2 to 4L with SPO2 maintaining >90%. Pt's RN was notified. Pt would continue to benefit from skilled physical therapy services at this time while admitted and after d/c to address the below listed limitations in order to improve overall safety and independence with functional mobility.     Follow Up Recommendations Home health PT    Equipment Recommendations  None recommended by PT    Recommendations for Other Services       Precautions / Restrictions Precautions Precautions: Fall;Other (comment) Precaution Comments: monitor SPO2 Restrictions Weight Bearing Restrictions: No      Mobility  Bed Mobility Overal bed mobility: Needs Assistance Bed Mobility: Supine to Sit     Supine to sit: Min guard;HOB elevated     General bed mobility comments: Min Guard for safety and increased time  Transfers Overall transfer level: Needs assistance Equipment used: Rolling walker (2 wheeled);None Transfers: Sit to/from Stand Sit to Stand: Min guard;From elevated surface         General transfer comment: min guard for  safety  Ambulation/Gait Ambulation/Gait assistance: Min guard Gait Distance (Feet): 100 Feet Assistive device: Rolling walker (2 wheeled);None Gait Pattern/deviations: Step-through pattern;Decreased stride length Gait velocity: decreased   General Gait Details: pt steady with RW and in room without RW, min guard overall, no LOB or need for physical assistance  Stairs            Wheelchair Mobility    Modified Rankin (Stroke Patients Only)       Balance Overall balance assessment: Needs assistance Sitting-balance support: No upper extremity supported;Feet supported Sitting balance-Leahy Scale: Good     Standing balance support: No upper extremity supported Standing balance-Leahy Scale: Fair Standing balance comment: Benefits from [employer] A for safety                             Pertinent Vitals/Pain Pain Assessment: No/denies pain Pain Intervention(s): Monitored during session    Home Living Family/patient expects to be discharged to:: Private residence Living Arrangements: Spouse/significant other;Children Available Help at Discharge: Family;Available 24 hours/day Type of Home: House Home Access: Level entry     Home Layout: One level Home Equipment: Clinical cytogeneticist - 2 wheels;Cane - single point      Prior Function Level of Independence: Independent         Comments: uses 2L of O2 PRN     Hand Dominance   Dominant Hand: Right    Extremity/Trunk Assessment   Upper Extremity Assessment Upper Extremity Assessment: Defer to OT evaluation    Lower Extremity Assessment Lower Extremity Assessment: Overall  WFL for tasks assessed    Cervical / Trunk Assessment Cervical / Trunk Assessment: Kyphotic  Communication   Communication: HOH  Cognition Arousal/Alertness: Awake/alert Behavior During Therapy: WFL for tasks assessed/performed Overall Cognitive Status: Within Functional Limits for tasks assessed                                         General Comments General comments (skin integrity, edema, etc.): SpO2 in 90s on 3L O2; dropping at one point during mobility to 87% and elevated to 4L. With rest breaks and purse lip breathing, pt abel to resume 3L O2    Exercises     Assessment/Plan    PT Assessment Patient needs continued PT services  PT Problem List Cardiopulmonary status limiting activity       PT Treatment Interventions DME instruction;Gait training;Stair training;Functional mobility training;Therapeutic activities;Therapeutic exercise;Balance training;Neuromuscular re-education;Patient/family education    PT Goals (Current goals can be found in the Care Plan section)  Acute Rehab PT Goals Patient Stated Goal: "Go home" PT Goal Formulation: With patient Time For Goal Achievement: 11/20/18 Potential to Achieve Goals: Good    Frequency Min 3X/week   Barriers to discharge        Co-evaluation PT/OT/SLP Co-Evaluation/Treatment: Yes Reason for Co-Treatment: For patient/therapist safety;To address functional/ADL transfers PT goals addressed during session: Mobility/safety with mobility;Balance;Proper use of DME;Strengthening/ROM         AM-PAC PT "6 Clicks" Mobility  Outcome Measure Help needed turning from your back to your side while in a flat bed without using bedrails?: None Help needed moving from lying on your back to sitting on the side of a flat bed without using bedrails?: None Help needed moving to and from a bed to a chair (including a wheelchair)?: A Little Help needed standing up from a chair using your arms (e.g., wheelchair or bedside chair)?: A Little Help needed to walk in hospital room?: A Little Help needed climbing 3-5 steps with a railing? : A Little 6 Click Score: 20    End of Session Equipment Utilized During Treatment: Oxygen(3-4L of O2) Activity Tolerance: Patient tolerated treatment well Patient left: in chair;with call bell/phone within  reach;with chair alarm set Nurse Communication: Mobility status PT Visit Diagnosis: Other abnormalities of gait and mobility (R26.89)    Time: 8101-7510 PT Time Calculation (min) (ACUTE ONLY): 28 min   Charges:   PT Evaluation $PT Eval Moderate Complexity: 1 Mod          Sherie Don, PT, DPT  Acute Rehabilitation Services Pager [phone] Office [place] 11/06/2018, 1:04 PM

## 2018-11-06 NOTE — Plan of Care (Signed)
  Problem: Health Behavior/Discharge Planning: Goal: Ability to manage health-related needs will improve Outcome: Progressing   Problem: Clinical Measurements: Goal: Respiratory complications will improve Outcome: Progressing   Problem: Coping: Goal: Level of anxiety will decrease Outcome: Progressing   Problem: Education: Goal: Knowledge of General Education information will improve Description Including pain rating scale, medication(s)/side effects and non-pharmacologic comfort measures Outcome: Progressing

## 2018-11-06 NOTE — Progress Notes (Signed)
CRITICAL VALUE ALERT  Critical Value:  Positive for RSV  Date & Time Notied:  0340  Provider Notified: Doristine Mango  Orders Received/Actions taken:  No new orders pt already on droplet. isolation

## 2018-11-06 NOTE — Progress Notes (Signed)
From home with spouse and daughter c/o worsening cough and SOB  R/TCPOD exacerbation.  Alert and oriented x 4 MAE gait slightly study noted expiratory wheezing and SOB on exertion.  cardiac.  Monitory box 3 W  01 in use verification completed . Will continue to monitor.

## 2018-11-06 NOTE — Progress Notes (Signed)
Family Medicine Teaching Service Daily Progress Note Intern Pager: [phone]  Patient name: Dean [name] Medical record number: [id number] Date of birth: [birth year] Age: 85 y.o. Gender: male  Primary Care Provider: Angelina Sheriff, MD Consultants: none Code Status: partial  Pt Overview and Major Events to Date:  1/31- admitted  Assessment and Plan: Dean [name] is a 85 y.o. male presenting with 2 days of worsening dry cough and SOB. PMH is significant for COPD on 2L O2 at night, Afib on coumadin, constipation, urinary retention, GERD, aortic valve replacement, gout, HTN, HLD, OA  COPD exacerbation- 3L O2 overnight and rested comfortably. Lungs clear to auscultation except mild scattered wheezes. RSV came back positive. Patient remains afebrile. WBC count is low- 1.8, blood cx no growth <24 hours - CTX/azithromycin daily (1/31-)  - s/p solumedrol, continue prednisone daily (2/1-) - duonebs q6h scheduled - albuterol PRN - continue home meds: pulmicort BID, claritin, flonase BID - mucinex - incentive spirometry - continuous pulse ox - CBC am - BMP am - f/u blood cultures - PT/OT consult - up with assistance  Afib/biosynthetic Aortic valve replacement, QTc prolongation- home meds: warfarin 3mg  qd, cardizem 120 daily. Heart rate better controlled this morning- 88-90. ECG on admission sinus tachycardia with QTc prolongation 507. PT/INR 23.5/2.13 - continue warfarin per pharm - continue cardizem - PT/INR - continuous cardiac monitoring - avoid QTc prolonging agents  H/o Ileus- home meds: amitizia BID PRN. Last BM 2 days ago. - continue home med - miralax BID  Urinary retention, h/o prostate CA- home meds: flomax - continue home med - strict I/Os  HTN- home meds: lopressor 25mg  BID. normotensive this admission - continue home meds - vitals per floor protocol   Macrocytic Anemia- Hgb 9.5>8.1, MCV 119.7, BL >10. Differential showed decreased neutro Abs  and Lymphs abs - CBC am  HLD- home meds: none, no lipid panel in chart.  OA- home meds: oxycodone PRN. Patient states that he rarely takes this. He is scheduled for neck injections 2/7. - treat pain PRN  Chronically thrombocytic- platelets 859 on this admission - CBC with diff am  FEN/GI: heart Prophylaxis: warfarin  Disposition: cardiac-telemetry   Subjective:  Patient resting comfortably, had good night sleep  Objective: Temp:  [98.8 F (37.1 C)-99.4 F (37.4 C)] 98.8 F (37.1 C) (01/31 2137) Pulse Rate:  [59-130] 110 (01/31 2137) Resp:  [18-29] 22 (01/31 2137) BP: (97-121)/(62-77) 121/77 (01/31 2137) SpO2:  [89 %-98 %] 97 % (01/31 2137) FiO2 (%):  [40 %] 40 % (01/31 1831) Weight:  [77.1 kg-78.1 kg] 78.1 kg (01/31 2137) Physical Exam: General: resting comfortably, NAD Cardiovascular: RRR, no murmur Respiratory: CTAB except mild scattered wheezing. No increased WOB, on 3L O2 Abdomen: soft, non-tender Extremities: no LE edema  Laboratory: Recent Labs  Lab 11/05/18 1632 11/06/18 0010  WBC 4.3 PENDING  HGB 9.5* 8.1*  HCT 30.4* 25.9*  PLT 859* 645*   Recent Labs  Lab 11/05/18 1632 11/05/18 1651 11/06/18 0010  NA 139  --  139  K 4.8  --  4.0  CL 106  --  108  CO2 24  --  20*  BUN 28*  --  32*  CREATININE 1.49*  --  1.82*  CALCIUM 8.8*  --  8.2*  PROT  --  7.2  --   BILITOT  --  0.5  --   ALKPHOS  --  59  --   ALT  --  13  --   AST  --  22  --   GLUCOSE 129*  --  309*   MRSA neg Influenza neg RVP pos RSV  Imaging/Diagnostic Tests: Dg Chest Portable 1 View  Result Date: 11/05/2018 CLINICAL DATA:  Respiratory distress.  Productive cough. EXAM: PORTABLE CHEST 1 VIEW COMPARISON:  PA and lateral chest 01/12/2018.  CT chest 10/19/2017. FINDINGS: Mild elevation of the right hemidiaphragm and scar at the right costophrenic angle are unchanged. No consolidative process, pneumothorax or effusion. Heart size is normal. Aortic atherosclerosis is seen.  Pacing device is in place. IMPRESSION: No acute disease. Atherosclerosis. Electronically Signed   By: Inge Rise M.D.   On: 11/05/2018 17:48     Richarda Osmond, DO 11/06/2018, 1:26 AM PGY-1, [place] Intern pager: [phone], text pages welcome

## 2018-11-07 LAB — BASIC METABOLIC PANEL
Anion gap: 8 (ref 5–15)
BUN: 46 mg/dL — ABNORMAL HIGH (ref 8–23)
CO2: 21 mmol/L — ABNORMAL LOW (ref 22–32)
Calcium: 8.1 mg/dL — ABNORMAL LOW (ref 8.9–10.3)
Chloride: 110 mmol/L (ref 98–111)
Creatinine, Ser: 1.5 mg/dL — ABNORMAL HIGH (ref 0.61–1.24)
GFR calc Af Amer: 46 mL/min — ABNORMAL LOW (ref >60)
GFR calc non Af Amer: 40 mL/min — ABNORMAL LOW (ref >60)
Glucose, Bld: 116 mg/dL — ABNORMAL HIGH (ref 70–99)
Potassium: 4.7 mmol/L (ref 3.5–5.1)
Sodium: 139 mmol/L (ref 135–145)

## 2018-11-07 LAB — CBC WITH DIFFERENTIAL/PLATELET
Abs Immature Granulocytes: 0.1 10*3/uL — ABNORMAL HIGH (ref 0.00–0.07)
Basophils Absolute: 0 10*3/uL (ref 0.0–0.1)
Basophils Relative: 0 %
Eosinophils Absolute: 0 10*3/uL (ref 0.0–0.5)
Eosinophils Relative: 0 %
HEMATOCRIT: 24.7 % — AB (ref 39.0–52.0)
Hemoglobin: 8.1 g/dL — ABNORMAL LOW (ref 13.0–17.0)
Immature Granulocytes: 2 %
LYMPHS ABS: 0.6 10*3/uL — AB (ref 0.7–4.0)
Lymphocytes Relative: 9 %
MCH: 38.8 pg — ABNORMAL HIGH (ref 26.0–34.0)
MCHC: 32.8 g/dL (ref 30.0–36.0)
MCV: 118.2 fL — AB (ref 80.0–100.0)
Monocytes Absolute: 0.5 10*3/uL (ref 0.1–1.0)
Monocytes Relative: 8 %
NEUTROS PCT: 81 %
NRBC: 0 % (ref 0.0–0.2)
Neutro Abs: 4.8 10*3/uL (ref 1.7–7.7)
Platelets: 645 10*3/uL — ABNORMAL HIGH (ref 150–400)
RBC: 2.09 MIL/uL — ABNORMAL LOW (ref 4.22–5.81)
RDW: 13.3 % (ref 11.5–15.5)
WBC: 6 10*3/uL (ref 4.0–10.5)

## 2018-11-07 LAB — PROTIME-INR
INR: 2.52
Prothrombin Time: 26.8 seconds — ABNORMAL HIGH (ref 11.4–15.2)

## 2018-11-07 MED ORDER — AZITHROMYCIN 250 MG PO TABS
250.0000 mg | ORAL_TABLET | Freq: Every day | ORAL | Status: DC
  Administered 2018-11-07 – 2018-11-08 (×2): 250 mg via ORAL
  Filled 2018-11-07 (×2): qty 1

## 2018-11-07 MED ORDER — CEFDINIR 300 MG PO CAPS
300.0000 mg | ORAL_CAPSULE | Freq: Two times a day (BID) | ORAL | Status: DC
  Administered 2018-11-07 – 2018-11-08 (×2): 300 mg via ORAL
  Filled 2018-11-07 (×2): qty 1

## 2018-11-07 MED ORDER — HYDROXYUREA 500 MG PO CAPS
500.0000 mg | ORAL_CAPSULE | Freq: Two times a day (BID) | ORAL | Status: DC
  Administered 2018-11-07 – 2018-11-08 (×3): 500 mg via ORAL
  Filled 2018-11-07 (×4): qty 1

## 2018-11-07 NOTE — Progress Notes (Signed)
Family Medicine Teaching Service Daily Progress Note Intern Pager: [phone]  Patient name: Dean [name] Medical record number: [id number] Date of birth: [birth year] Age: 85 y.o. Gender: male  Primary Care Provider: Angelina Sheriff, MD Consultants: none Code Status: partial  Pt Overview and Major Events to Date:  1/31- admitted  Assessment and Plan: Dean [name] is a 85 y.o. male presenting with 2 days of worsening dry cough and SOB. PMH is significant for COPD on 2L O2 at night, Afib on coumadin, constipation, urinary retention, GERD, aortic valve replacement, gout, HTN, HLD, OA  COPD exacerbation vs viral pneumonia  Patient was on 2L O2 overnight improved from 3L. Lung exam without any wheezing this morning but still some cough with deep breathing. RSV positive. Patient remains afebrile. WBC 6.0.  blood cx with NGTD. Patient is improving would transition to po antibiotic given no growth on culture for the past 48 hrs. --Start patient on cefdinir 300 mg bid for total of 7 days  DAY 3 --Continue azithromycin 250 mg po for a total of 5 days DAY 3 -- Continue prednisone daily (2/1-) 5 day total DAY 2 --Trial off O2 - duonebs q6h scheduled - albuterol PRN - continue home meds: pulmicort BID, claritin, flonase BID - mucinex - incentive spirometry - continuous pulse ox - CBC and BMP am - f/u blood cultures - PT/OT consult  Afib/biosynthetic Aortic valve replacement, QTc prolongation home meds: warfarin 3mg  qd, cardizem 120 daily. Heart rate better controlled this morning- 88-90. ECG on admission sinus tachycardia with QTc prolongation 507. PT/INR 23.5/2.13 - continue warfarin per pharm - continue cardizem - PT/INR - continuous cardiac monitoring - avoid QTc prolonging agents  H/o Ileus home meds: amitizia BID PRN. Last BM 2 days ago. - continue home med - miralax BID  Urinary retention, h/o prostate CA- home meds: flomax - continue home med - strict  I/Os  HTN BP 125/77, home meds: lopressor 25mg  BID. normotensive this admission - continue home meds - vitals per floor protocol   Macrocytic Anemia, Stable  Hgb 9.5>8.1>8.1, MCV 119.7, BL >10. Differential showed decreased neutro Abs and Lymphs abs --CBC am  Osteoarthritis  home meds: oxycodone PRN. Patient states that he rarely takes this. He is scheduled for neck injections 2/7. - PRN pain control  Thrombocytosis, chronic Platelets 859 on admission. Down trending still elevated. 645 this am. On hydroxyurea. --Restart hydroxyurea --Follow up on am CBC   FEN/GI: heart Prophylaxis: warfarin  Disposition: cardiac-telemetry   Subjective:  No acute complaints this morning. Sitting in the chair, breathing has improved, mild cough.  Objective: Temp:  [97.3 F (36.3 C)-98.2 F (36.8 C)] 97.3 F (36.3 C) (02/02 1140) Pulse Rate:  [80-91] 88 (02/02 1140) Resp:  [15-20] 18 (02/02 1140) BP: (106-125)/(61-77) 125/77 (02/02 1140) SpO2:  [95 %-99 %] 99 % (02/02 1140)   Physical Exam: General: NAD, pleasant, able to participate in exam Cardiac: RRR, normal heart sounds, no murmurs. 2+ radial and PT pulses bilaterally Respiratory: CTAB, normal effort, No wheezes, rales or rhonchi Abdomen: soft, nontender, nondistended, no hepatic or splenomegaly, +BS Extremities: no edema or cyanosis. WWP. Skin: warm and dry, no rashes noted Neuro: alert and oriented x4, no focal deficits Psych: Normal affect and mood   Laboratory: Recent Labs  Lab 11/05/18 1632 11/06/18 0010 11/07/18 0521  WBC 4.3 1.8* 6.0  HGB 9.5* 8.1* 8.1*  HCT 30.4* 25.9* 24.7*  PLT 859* 645* 645*   Recent Labs  Lab 11/05/18 1632 11/05/18  1651 11/06/18 0010 11/07/18 0521  NA 139  --  139 139  K 4.8  --  4.0 4.7  CL 106  --  108 110  CO2 24  --  20* 21*  BUN 28*  --  32* 46*  CREATININE 1.49*  --  1.82* 1.50*  CALCIUM 8.8*  --  8.2* 8.1*  PROT  --  7.2  --   --   BILITOT  --  0.5  --   --    ALKPHOS  --  59  --   --   ALT  --  13  --   --   AST  --  22  --   --   GLUCOSE 129*  --  309* 116*   MRSA neg Influenza neg RVP pos RSV  Imaging/Diagnostic Tests: Dg Chest Portable 1 View  Result Date: 11/05/2018 CLINICAL DATA:  Respiratory distress.  Productive cough. EXAM: PORTABLE CHEST 1 VIEW COMPARISON:  PA and lateral chest 01/12/2018.  CT chest 10/19/2017. FINDINGS: Mild elevation of the right hemidiaphragm and scar at the right costophrenic angle are unchanged. No consolidative process, pneumothorax or effusion. Heart size is normal. Aortic atherosclerosis is seen. Pacing device is in place. IMPRESSION: No acute disease. Atherosclerosis. Electronically Signed   By: Inge Rise M.D.   On: 11/05/2018 17:48     Marjie Skiff, MD 11/07/2018, 11:44 AM PGY-3, [place] Intern pager: [phone], text pages welcome

## 2018-11-07 NOTE — Progress Notes (Signed)
P.t. no longer using oxygen while in chair or bed in  Room.

## 2018-11-07 NOTE — Progress Notes (Signed)
Occupational Therapy Treatment Patient Details Name: Dean [name] MRN: [id number] DOB: 01-08-1924 Today's Date: 11/07/2018    History of present illness Pt is a 85 y.o. male presenting with worsening dry cough and SOB. Pt found to have an acute COPD exacerbation and positive for RSV. PMH is significant for COPD on 2L O2 at night, Afib on coumadin, constipation, urinary retention, GERD, aortic valve replacement, gout, HTN, HLD, OA   OT comments  Pt. Seen for skilled OT session. Able to complete bed mobility, toileting tasks, and LB B/D with minimal physical assistance.  Cues for rest breaks as endurance still an issue. Reports dtr. Lives with him and helps with all ADLS as needed.  Pt. Also frustrated with "dry cough", stating it kept him up without good rest last night. RN aware of pt. Concerns.     Follow Up Recommendations  Home health OT;Supervision/Assistance - 24 hour    Equipment Recommendations  None recommended by OT    Recommendations for Other Services      Precautions / Restrictions Precautions Precautions: Fall;Other (comment) Precaution Comments: monitor SPO2 Restrictions Weight Bearing Restrictions: No       Mobility Bed Mobility   Bed Mobility: Supine to Sit     Supine to sit: Supervision;HOB elevated        Transfers Overall transfer level: Needs assistance Equipment used: None Transfers: Sit to/from [employer] Transfers Sit to Stand: Min guard Stand pivot transfers: Min guard       General transfer comment: min guard for safety    Balance                                           ADL either performed or assessed with clinical judgement   ADL Overall ADL's : Needs assistance/impaired             Lower Body Bathing: Minimal assistance;Sit to/from stand   Upper Body Dressing : Min guard;Sitting;Standing   Lower Body Dressing: Minimal assistance;Sit to/from stand   Toilet Transfer: Min  guard;Ambulation Toilet Transfer Details (indicate cue type and reason): pt. used IV pole during ambulation Toileting- Clothing Manipulation and Hygiene: Minimal assistance;Sit to/from stand         General ADL Comments: Pt motivated to participate in therapy. Decreased activity tolerance and endurance. Dry cough during activity     Vision       Perception     Praxis      Cognition Arousal/Alertness: Awake/alert Behavior During Therapy: WFL for tasks assessed/performed Overall Cognitive Status: Within Functional Limits for tasks assessed                                          Exercises     Shoulder Instructions       General Comments      Pertinent Vitals/ Pain       Pain Assessment: No/denies pain  Home Living                                          Prior Functioning/Environment              Frequency  Min 2X/week  Progress Toward Goals  OT Goals(current goals can now be found in the care plan section)  Progress towards OT goals: Progressing toward goals     Plan      Co-evaluation                 AM-PAC OT "6 Clicks" Daily Activity     Outcome Measure   Help from another person eating meals?: None Help from another person taking care of personal grooming?: A Little Help from another person toileting, which includes using toliet, bedpan, or urinal?: A Little Help from another person bathing (including washing, rinsing, drying)?: A Little Help from another person to put on and taking off regular upper body clothing?: A Little Help from another person to put on and taking off regular lower body clothing?: A Little 6 Click Score: 19    End of Session Equipment Utilized During Treatment: Oxygen  OT Visit Diagnosis: Unsteadiness on feet (R26.81);Other abnormalities of gait and mobility (R26.89);Muscle weakness (generalized) (M62.81)   Activity Tolerance Patient tolerated treatment well    Patient Left in chair;with call bell/phone within reach;with nursing/sitter in room   Nurse Communication Other (comment)(pt. c/o dry cough, rn reviewed pt. would be getting a breathing treatment soon)        Time: 6151-8343 OT Time Calculation (min): 32 min  Charges: OT General Charges $OT Visit: 1 Visit OT Treatments $Self Care/Home Management : 23-37 mins  Janice Coffin, COTA/L 11/07/2018, 10:42 AM

## 2018-11-08 LAB — BASIC METABOLIC PANEL
ANION GAP: 7 (ref 5–15)
BUN: 45 mg/dL — ABNORMAL HIGH (ref 8–23)
CO2: 24 mmol/L (ref 22–32)
Calcium: 8.4 mg/dL — ABNORMAL LOW (ref 8.9–10.3)
Chloride: 112 mmol/L — ABNORMAL HIGH (ref 98–111)
Creatinine, Ser: 1.21 mg/dL (ref 0.61–1.24)
GFR, EST AFRICAN AMERICAN: 60 mL/min — AB (ref >60)
GFR, EST NON AFRICAN AMERICAN: 52 mL/min — AB (ref >60)
Glucose, Bld: 102 mg/dL — ABNORMAL HIGH (ref 70–99)
Potassium: 4.3 mmol/L (ref 3.5–5.1)
Sodium: 143 mmol/L (ref 135–145)

## 2018-11-08 LAB — CBC WITH DIFFERENTIAL/PLATELET
Abs Immature Granulocytes: 0.1 10*3/uL — ABNORMAL HIGH (ref 0.00–0.07)
BASOS PCT: 0 %
Basophils Absolute: 0 10*3/uL (ref 0.0–0.1)
Eosinophils Absolute: 0 10*3/uL (ref 0.0–0.5)
Eosinophils Relative: 0 %
HCT: 25.1 % — ABNORMAL LOW (ref 39.0–52.0)
Hemoglobin: 8 g/dL — ABNORMAL LOW (ref 13.0–17.0)
Immature Granulocytes: 2 %
Lymphocytes Relative: 20 %
Lymphs Abs: 1 10*3/uL (ref 0.7–4.0)
MCH: 37.4 pg — ABNORMAL HIGH (ref 26.0–34.0)
MCHC: 31.9 g/dL (ref 30.0–36.0)
MCV: 117.3 fL — AB (ref 80.0–100.0)
Monocytes Absolute: 0.3 10*3/uL (ref 0.1–1.0)
Monocytes Relative: 6 %
Neutro Abs: 3.6 10*3/uL (ref 1.7–7.7)
Neutrophils Relative %: 72 %
Platelets: 636 10*3/uL — ABNORMAL HIGH (ref 150–400)
RBC: 2.14 MIL/uL — ABNORMAL LOW (ref 4.22–5.81)
RDW: 13.3 % (ref 11.5–15.5)
WBC: 4.9 10*3/uL (ref 4.0–10.5)
nRBC: 0 % (ref 0.0–0.2)

## 2018-11-08 LAB — PROTIME-INR
INR: 2.17
Prothrombin Time: 23.9 seconds — ABNORMAL HIGH (ref 11.4–15.2)

## 2018-11-08 MED ORDER — CEFDINIR 300 MG PO CAPS: 300.0000 mg | ORAL_CAPSULE | Freq: Two times a day (BID) | ORAL | 0 refills | Status: AC

## 2018-11-08 MED ORDER — CEFDINIR 300 MG PO CAPS
300.0000 mg | ORAL_CAPSULE | Freq: Two times a day (BID) | ORAL | Status: DC
  Filled 2018-11-08: qty 1

## 2018-11-08 MED ORDER — PREDNISONE 50 MG PO TABS: ORAL_TABLET | ORAL | 0 refills | Status: AC

## 2018-11-08 MED ORDER — AZITHROMYCIN 250 MG PO TABS: ORAL_TABLET | ORAL | 0 refills | Status: AC

## 2018-11-08 NOTE — Care Management Note (Addendum)
Case Management Note  Patient Details  Name: Dean [name] MRN: [id number] Date of Birth: [birth year]  Subjective/Objective:       Pt admitted with COPD exacerbation. He is from home with wife and daughter. Pt uses oxygen PRN at home with an old concentrator he bought through [place]. He is no longer active with [employer]. Pt is interested in changing DME companies.  DME: walker, shower seat, 3 in 1, cane Daughter provides transportation.  No issues obtaining meds.             Action/Plan: Pt with orders for [facility] services. CM provided choice and he selected [facility]. CM called [facility] and faxed them the required information.  CM following for potential increase in oxygen needs at d/c.  Addendum: (1630): per bedside RN pt sats remained above 88% on room air with ambulation. CM spoke to MD and they are d/cing him home. CM updated [place]. Daughter at bedside and will provide transport home.  Expected Discharge Date:  11/06/18               Expected Discharge Plan:  [place]  In-House Referral:     Discharge planning Services  CM Consult  Post Acute Care Choice:    Choice offered to:     DME Arranged:    DME Agency:     HH Arranged:  PT, OT HH Agency:  [facility]  Status of Service:  In process, will continue to follow  If discussed at Long Length of Stay Meetings, dates discussed:    Additional Comments:  Pollie Friar, RN 11/08/2018, 11:25 AM

## 2018-11-08 NOTE — Progress Notes (Signed)
Physical Therapy Treatment Patient Details Name: Dean [name] MRN: [id number] DOB: 06/05/1924 Today's Date: 11/08/2018    History of Present Illness Pt is a 85 y.o. male presenting with worsening dry cough and SOB. Pt found to have an acute COPD exacerbation and positive for RSV. PMH is significant for COPD on 2L O2 at night, Afib on coumadin, constipation, urinary retention, GERD, aortic valve replacement, gout, HTN, HLD, OA    PT Comments    Pt progressing well today goals. Received on 2LO2 [place], moved to room air, satting well with exercise and transfers practice at EOB. Pt also able to AMB to BR, void urine/bowel, all on room air, SpO2>91%, and DOE increases minimal. Pt then progresses AMB to 240ft c RW on room air, maintained SpO2 92% when checked several times throughout. HR in AF momentarily during AMB, rate in 120s momentarily before resolving to low 100s standing rest. Pt left up in chair with lunch tray presented. All needs met.   Follow Up Recommendations  Home health PT     Equipment Recommendations  Cane    Recommendations for Other Services       Precautions / Restrictions Precautions Precautions: Fall;Other (comment) Precaution Comments: monitor SpO2 Restrictions Weight Bearing Restrictions: No    Mobility  Bed Mobility Overal bed mobility: Needs Assistance Bed Mobility: Supine to Sit           General bed mobility comments: Given a HHA to pull self into trunk flexion.  Transfers Overall transfer level: Needs assistance Equipment used: None Transfers: Sit to/from Stand Sit to Stand: Supervision         General transfer comment: performed 5x from EOB on room air; SpO2 93% or higher  Ambulation/Gait Ambulation/Gait assistance: Supervision Gait Distance (Feet): 240 Feet Assistive device: Rolling walker (2 wheeled) Gait Pattern/deviations: Step-through pattern;Decreased stride length Gait velocity: 0.70m/s  Gait velocity interpretation: 1.31 -  2.62 ft/sec, indicative of limited community ambulator General Gait Details: ops to use RW for additional stability.    Stairs             Wheelchair Mobility    Modified Rankin (Stroke Patients Only)       Balance Overall balance assessment: Modified Independent         Standing balance support: No upper extremity supported Standing balance-Leahy Scale: Good                              Cognition Arousal/Alertness: Awake/alert Behavior During Therapy: WFL for tasks assessed/performed Overall Cognitive Status: Within Functional Limits for tasks assessed                                        Exercises      General Comments        Pertinent Vitals/Pain Pain Assessment: No/denies pain    Home Living                      Prior Function            PT Goals (current goals can now be found in the care plan section) Acute Rehab PT Goals Patient Stated Goal: "Go home" PT Goal Formulation: With patient Time For Goal Achievement: 11/20/18 Potential to Achieve Goals: Good Progress towards PT goals: Progressing toward goals    Frequency    Min 3X/week        PT Plan Current plan remains appropriate    Co-evaluation              AM-PAC PT "6 Clicks" Mobility   Outcome Measure  Help needed turning from your back to your side while in a flat bed without using bedrails?: None Help needed moving from lying on your back to sitting on the side of a flat bed without using bedrails?: None Help needed moving to and from a bed to a chair (including a wheelchair)?: A Little Help needed standing up from a chair using your arms (e.g., wheelchair or bedside chair)?: A Little Help needed to walk in hospital room?: A Little Help needed climbing 3-5 steps with a railing? : A Little 6 Click Score: 20    End of Session Equipment Utilized During Treatment: Oxygen(donned upon entry, left on RA) Activity Tolerance: Patient  tolerated treatment well;Patient limited by fatigue Patient left: in chair;with call bell/phone within reach;with chair alarm set Nurse Communication: Mobility status PT Visit Diagnosis: Other abnormalities of gait and mobility (R26.89)     Time: 1055-1118 PT Time Calculation (min) (ACUTE ONLY): 23 min  Charges:  $Gait Training: 8-22 mins $Therapeutic Activity: 8-22 mins                     11:51 AM, 11/08/18 Allan C Buccola, PT, DPT Physical Therapist - [place] [phone] (Pager)  [phone] (Office)      Buccola,Allan C 11/08/2018, 11:48 AM   

## 2018-11-08 NOTE — Consult Note (Signed)
   [facility] CM Inpatient Consult   11/08/2018  Dean [name] Nov 23, 1923 [id number]   Patient screened for potential [facility] Care Management services due to unplanned readmission risk score of 25%, high.   Went to bedside to speak with Dean [name] and explain [place] Management Services. Spoke with patient and daughter at bedside. Daughter states that patient had received telephone calls for post hospital follow up a couple of years ago. Daughter states that Dean [name] will have home health services upon discharge.  Explained to patient and daughter that [place] does not interfere with any services provided by home health agency or arranged by inpatient care management or social worker.   Dean [name] states that he does not feel that he needs additional follow up at this time. Patient accepted [facility] brochure and refrigerator magnet and stated that he would call the toll-free number should he change his mind. Patient declines [facility] CM services at this time.   Netta Cedars, MSN, [place] [facility] Liaison Nurse Mobile Phone [phone]  Toll free office [phone]

## 2018-11-08 NOTE — Plan of Care (Signed)
Adequate for discharge.

## 2018-11-08 NOTE — Discharge Instructions (Addendum)
Dear Dean [name],   Thank you for letting us participate in your care! In this section, you will find a brief hospital admission summary of why you were admitted to the hospital, what happened during your admission, your diagnosis/diagnoses, and recommended follow up.   You were admitted because you were experiencing shortness of breath.  You were diagnosed with RSV which is a common virus that causes upper respiratory infections.  At your primary care provider prior to admission, your chest x-ray appeared to have a pneumonia for which she was started on ceftriaxone and azithromycin.  You were continued on these medications during her hospitalization.  Please continue to take these medications for their full courses, even if you start feeling better at home.    You were continued on your inhalers while in the hospital and received breathing treatments every 6 hours.  If you have wheezing at home, please continue to take your albuterol every 4 hours as needed for wheezing and shortness of breath.   You also required oxygen during her admission, but you were able to wean down to room air.  If you feel short of breath at home, please put on oxygen to 2 L until you are breathing comfortably.  You can trial off of the oxygen as you feel comfortable.  Because you have a viral illness in the setting of respiratory disease, you may require oxygen for a couple of more days until you are back at baseline.    Please return to the ED if you are experiencing any fevers, chills, respiratory distress that does not respond to oxygen, or are requiring oxygen for several hours at a time.   POST-HOSPITAL & CARE INSTRUCTIONS 1. Continue cefdinir 300 mg twice daily.  Your last dose will be on 2/7 after your final evening dose 2. Please continue azithromycin 250 mg daily.  Your last dose will be 2/5 3. Please continue prednisone daily.  Your last dose will be 2/6. 4. Please continue to use your oxygen at home as  needed for shortness of breath.  5. Please continue to take your albuterol inhaler every 2-4 hours as needed for wheezing.   6. Please let PCP/Specialists know of any changes that were made.  7. Please see medications section of this packet for any medication changes.   DOCTOR'S APPOINTMENT & FOLLOW UP CARE INSTRUCTIONS  Please make an appointment with your primary care provider for hospital follow-up  Thank you for choosing [facility]! Take care and be well!  [facility]  [place], [place] [address] [phone]

## 2018-11-08 NOTE — Progress Notes (Signed)
Occupational Therapy Treatment Patient Details Name: Dean [name] MRN: [id number] DOB: 09/10/1924 Today's Date: 11/08/2018    History of present illness Pt is a 85 y.o. male presenting with worsening dry cough and SOB. Pt found to have an acute COPD exacerbation and positive for RSV. PMH is significant for COPD on 2L O2 at night, Afib on coumadin, constipation, urinary retention, GERD, aortic valve replacement, gout, HTN, HLD, OA   OT comments  Patient progressing well.  On RA throughout session with oxygen saturations > 91%.  Reviewed energy conservation techniques and safety, as patient plans to dc home today.  Completed transfers with min guard to supervision (min guard initial stand due to unsteadiness fading to supervision) with toileting completed at supervision level.  Continue to recommend [place] services.     Follow Up Recommendations  Home health OT;Supervision/Assistance - 24 hour    Equipment Recommendations  None recommended by OT    Recommendations for Other Services      Precautions / Restrictions Precautions Precautions: Fall;Other (comment) Precaution Comments: monitor SpO2 Restrictions Weight Bearing Restrictions: No       Mobility Bed Mobility Overal bed mobility: Needs Assistance Bed Mobility: Supine to Sit           General bed mobility comments: OOB in recliner upon entry  Transfers Overall transfer level: Needs assistance Equipment used: None Transfers: Sit to/from Stand Sit to Stand: Supervision;Min guard         General transfer comment: min guard for inital sit to stand, fades to supervision thorughout session     Balance Overall balance assessment: Needs assistance Sitting-balance support: No upper extremity supported;Feet supported Sitting balance-Leahy Scale: Good     Standing balance support: No upper extremity supported Standing balance-Leahy Scale: Good                             ADL either performed or  assessed with clinical judgement   ADL Overall ADL's : Needs assistance/impaired     Grooming: Wash/dry hands;Supervision/safety;Standing                   Toilet Transfer: Supervision/safety;Ambulation;RW;BSC Toilet Transfer Details (indicate cue type and reason): 3:1 over commode Toileting- Clothing Manipulation and Hygiene: Supervision/safety;Sit to/from stand       Functional mobility during ADLs: Supervision/safety;Rolling walker General ADL Comments: pt on room air during session with oxgyen saturationss <91%     Vision       Perception     Praxis      Cognition Arousal/Alertness: Awake/alert Behavior During Therapy: WFL for tasks assessed/performed Overall Cognitive Status: Within Functional Limits for tasks assessed                                          Exercises     Shoulder Instructions       General Comments reviewed energy conservation techniques and home safety    Pertinent Vitals/ Pain       Pain Assessment: No/denies pain  Home Living                                          Prior Functioning/Environment              Frequency  Min 2X/week        Progress Toward Goals  OT Goals(current goals can now be found in the care plan section)  Progress towards OT goals: Progressing toward goals  Acute Rehab OT Goals Patient Stated Goal: "Go home" OT Goal Formulation: With patient Time For Goal Achievement: 11/20/18 Potential to Achieve Goals: Good  Plan Discharge plan remains appropriate;Frequency remains appropriate    Co-evaluation                 AM-PAC OT "6 Clicks" Daily Activity     Outcome Measure   Help from another person eating meals?: None Help from another person taking care of personal grooming?: None Help from another person toileting, which includes using toliet, bedpan, or urinal?: None Help from another person bathing (including washing, rinsing, drying)?: A  Little Help from another person to put on and taking off regular upper body clothing?: None Help from another person to put on and taking off regular lower body clothing?: A Little 6 Click Score: 22    End of Session    OT Visit Diagnosis: Unsteadiness on feet (R26.81);Other abnormalities of gait and mobility (R26.89);Muscle weakness (generalized) (M62.81)   Activity Tolerance Patient tolerated treatment well   Patient Left in chair;with call bell/phone within reach;with family/visitor present   Nurse Communication Other (comment)(oxgyen saturations on RA)        Time: 2446-9507 OT Time Calculation (min): 18 min  Charges: OT General Charges $OT Visit: 1 Visit OT Treatments $Self Care/Home Management : 8-22 mins  Delight Stare, OT Acute Rehabilitation Services Pager [phone] Office [phone]    Delight Stare 11/08/2018, 2:56 PM

## 2018-11-08 NOTE — Progress Notes (Signed)
Attending Progress Note  Patient seen at bedside, doing well. Sitting up in chair. Feels not back to normal but is improving. Still with cough. He is agreeable to discharging home today, understands he is on oral antibiotics and steroids and can complete these at home.  Exam: Gen: no acute distress, pleasant, cooperative HEENT: Normocephalic, atraumatic, moist mucous membranes  Heart: regular rate and rhythm Lungs: normal work of breathing, clear Abdomen: soft, nontender to palpation Extremities: no edema  Stable for discharge home. Will sign resident note when it is complete.  Chrisandra Netters, MD [place]

## 2018-11-09 NOTE — Progress Notes (Addendum)
Family Medicine Teaching Service Daily Progress Note Intern Pager: [phone]  Patient name: Dean [name] Medical record number: [id number] Date of birth: Jun 23, 1924 Age: 85 y.o. Gender: male  Primary Care Provider: Angelina Sheriff, MD Consultants: None Code Status: Prior   Pt Overview and Major Events to Date:  Hospital Day: 4  Admitted: 11/05/2018 for CC: Shortness of Breath   Assessment and Plan: Dean [name] is a 85 y.o. male admitted for COPD exacerbation with pneumonia on outpatient chest x-ray prior to admission.  Chronic problems include A. fib, constipation, urinary retention, GERD, aortic valve replacement hypertension Hyperlipidemia can relate.Marland Kitchen   #Acute respiratory distress secondary to COPD exacerbation and possible pneumonia Patient is weaning to room air.  Patient was RSV positive.  Pneumonia seen on outpatient chest x-ray so no need to cover for MRSA.  Continue cefdinir for 7-day course, day 4  Continue azithromycin for 5-day course, day 4  Continue prednisone for 5-day course, day 3  Ambulate patient with pulse ox  Continue duo nebs, albuterol, Mucinex  #A. fib, aortic valve replacements No acute events on telemetry  Continue warfarin, Cardizem  continuous cardiac monitoring  #Constipation in setting of history of ileus  Continue amitizia BID PRN twice daily as needed  #Hypertension  Continue home Lopressor  #Anemia, stable Hemoglobin 8.0.  Asymptomatic  Consider outpatient work-up  #Thrombocytosis, chronic Platelets 636  Restart hydroxyurea as ANC is above 2  Disposition: Likely home today.    Subjective  Patient reports doing well overnight. No pain this morning.   Objective   Vital Signs Intake/Output  97.7, RR 22, HR 95, BP 107/89, 94% on RA  Filed Weights   11/05/18 1611 11/05/18 2137  Weight: 77.1 kg 78.1 kg   Intake/Output      02/03 0701 - 02/04 0700 02/04 0701 - 02/05 0700   P.O. 220    IV Piggyback      Total Intake(mL/kg) 220 (2.8)    Urine (mL/kg/hr) 225 (0.1)    Total Output 225    Net -5            Physical Exam  Gen: NAD, alert, non-toxic, well-appearing, sitting comfortably  Skin: Warm and dry HEENT: NCAT.  MMM.  CV: RRR.  Normal S1-S2. No BLEE. Resp: Coarse breath sounds throughout with mild wheezing Abd: NTND on palpation to all 4 quadrants.  Pos bowel sounds  Laboratory: Recent Labs  Lab 11/06/18 0010 11/07/18 0521 11/08/18 0655  WBC 1.8* 6.0 4.9  HGB 8.1* 8.1* 8.0*  HCT 25.9* 24.7* 25.1*  PLT 645* 645* 636*   Recent Labs  Lab 11/05/18 1651 11/06/18 0010 11/07/18 0521 11/08/18 0655  NA  --  139 139 143  K  --  4.0 4.7 4.3  CL  --  108 110 112*  CO2  --  20* 21* 24  BUN  --  32* 46* 45*  CREATININE  --  1.82* 1.50* 1.21  CALCIUM  --  8.2* 8.1* 8.4*  PROT 7.2  --   --   --   BILITOT 0.5  --   --   --   ALKPHOS 59  --   --   --   ALT 13  --   --   --   AST 22  --   --   --   GLUCOSE  --  309* 116* 102*    Imaging/Diagnostic Tests: No results found.   Wilber Oliphant, MD 11/09/2018, 6:38 PM PGY-1, [facility] Intern pager: [phone], text pages welcome

## 2018-11-09 NOTE — Discharge Summary (Signed)
[facility] Discharge Summary  Patient name: Dean [name] Medical record number: [id number] Date of birth: December 23, 1923 Age: 85 y.o. Gender: male Date of Admission: 11/05/2018  Date of Discharge: 11/09/18   Admitting Physician: Bufford Lope, DO  Primary Care Provider: Angelina Sheriff, MD Consultants: None   Indication for Hospitalization: <principal problem not specified>   Discharge Diagnoses/Problem List:  Active Problems:   COPD exacerbation [facility])  Disposition: Discharge to home  Discharge Condition: Stable  Discharge Exam:  BP 136/65 (BP Location: Left Arm)   Pulse 75   Temp 98 F (36.7 C) (Oral)   Resp 16   Ht 5\' 8"  (1.727 m)   Wt 78.1 kg   SpO2 96%   BMI 26.18 kg/m  Copied from physical exam from 2/4 progress note Physical Exam  Gen: NAD, alert, non-toxic, well-appearing, sitting comfortably  Skin: Warm and dry HEENT: NCAT.  MMM.  CV: RRR.  Normal S1-S2. No BLEE. Resp: Coarse breath sounds throughout with mild wheezing Abd: NTND on palpation to all 4 quadrants.  Pos bowel sounds  Brief Hospital Course:  Dean [name] is a 85 y.o. male with past medical history significant for COPD, who presented with shortness of breath.  Initial work up with significant for positive RSV.  Patient was treated as COPD exacerbation and started on prednisone burst with azithromycin for 5 days.  In setting of patient's age and respiratory status, patient was also treated for possible pneumonia that was identified on outpatient chest x-ray prior to admission.  He received cefdinir for a 7-day course.  At home, patient requires oxygen as needed.  During his admission, patient required oxygen up to 3 L nasal cannula.  Through his admission, patient's respiratory status continued to improve and he was discharged on p.o. antibiotics with instructions to finish the entire course.  Issues for Follow Up:  1. Symptom improvement of shortness of  breath, wheezing  Significant Procedures:   Procedure Orders     EKG 12-Lead     ED EKG     EKG 12-Lead     EKG   Significant Labs and Imaging:  Recent Labs  Lab 11/06/18 0010 11/07/18 0521 11/08/18 0655  WBC 1.8* 6.0 4.9  HGB 8.1* 8.1* 8.0*  HCT 25.9* 24.7* 25.1*  PLT 645* 645* 636*   Recent Labs  Lab 11/05/18 1632 11/05/18 1651 11/06/18 0010 11/07/18 0521 11/08/18 0655  NA 139  --  139 139 143  K 4.8  --  4.0 4.7 4.3  CL 106  --  108 110 112*  CO2 24  --  20* 21* 24  GLUCOSE 129*  --  309* 116* 102*  BUN 28*  --  32* 46* 45*  CREATININE 1.49*  --  1.82* 1.50* 1.21  CALCIUM 8.8*  --  8.2* 8.1* 8.4*  ALKPHOS  --  59  --   --   --   AST  --  22  --   --   --   ALT  --  13  --   --   --   ALBUMIN  --  3.5  --   --   --     Dg Chest Portable 1 View  Result Date: 11/05/2018 CLINICAL DATA:  Respiratory distress.  Productive cough. EXAM: PORTABLE CHEST 1 VIEW COMPARISON:  PA and lateral chest 01/12/2018.  CT chest 10/19/2017. FINDINGS: Mild elevation of the right hemidiaphragm and scar at the right costophrenic angle are  unchanged. No consolidative process, pneumothorax or effusion. Heart size is normal. Aortic atherosclerosis is seen. Pacing device is in place. IMPRESSION: No acute disease. Atherosclerosis. Electronically Signed   By: Inge Rise M.D.   On: 11/05/2018 17:48    Results/Tests Pending at Time of Discharge:  . None  Discharge Medications:  Allergies as of 11/08/2018   No Known Allergies     Medication List    STOP taking these medications   dextromethorphan-guaiFENesin 30-600 MG 12hr tablet Commonly known as:  MUCINEX DM     TAKE these medications   acetaminophen 500 MG tablet Commonly known as:  TYLENOL Take 500 mg by mouth every 6 (six) hours as needed for headache (pain).   albuterol (2.5 MG/3ML) 0.083% nebulizer solution Commonly known as:  PROVENTIL Take 2.5 mg by nebulization 2 (two) times daily.   azithromycin 250 MG  tablet Commonly known as:  ZITHROMAX 1 tablet daily.   budesonide 0.25 MG/2ML nebulizer solution Commonly known as:  PULMICORT Take 0.25 mg by nebulization 2 (two) times daily.   calcitRIOL 0.25 MCG capsule Commonly known as:  ROCALTROL Take 0.25 mcg by mouth every other day.   cefdinir 300 MG capsule Commonly known as:  OMNICEF Take 1 capsule (300 mg total) by mouth 2 (two) times daily for 4 days.   diltiazem 120 MG 24 hr capsule Commonly known as:  CARDIZEM CD Take 120 mg by mouth daily.   fexofenadine 180 MG tablet Commonly known as:  ALLEGRA Take 180 mg by mouth daily as needed for allergies.   fluticasone 50 MCG/ACT nasal spray Commonly known as:  FLONASE Place 1 spray into both nostrils 2 (two) times daily.   gabapentin 300 MG capsule Commonly known as:  NEURONTIN Take 300 mg by mouth at bedtime.   hydroxyurea 500 MG capsule Commonly known as:  HYDREA Take 500 mg by mouth 2 (two) times daily.   lubiprostone 24 MCG capsule Commonly known as:  AMITIZA Take 24 mcg by mouth 2 (two) times daily as needed for constipation.   metoprolol tartrate 25 MG tablet Commonly known as:  LOPRESSOR Take 25 mg by mouth 2 (two) times daily.   montelukast 10 MG tablet Commonly known as:  SINGULAIR Take 1 tablet (10 mg total) by mouth at bedtime.   MUCINEX 600 MG 12 hr tablet Generic drug:  guaiFENesin Take 600 mg by mouth 2 (two) times daily.   predniSONE 50 MG tablet Commonly known as:  DELTASONE Once daily   tamsulosin 0.4 MG Caps capsule Commonly known as:  FLOMAX Take 0.4 mg by mouth at bedtime.   TUMS PO Take 2 tablets by mouth daily as needed (heartburn/indigestion).   warfarin 3 MG tablet Commonly known as:  COUMADIN Take 1/2 tablet (1.5 mg) by mouth with 1/2 1 mg tablet daily at bedtime for a total daily dose of 2.5 mg for 6 days while on fluconazole.  After this 6 day course, please take your regular dose of 1 tablet by mouth with 1/2 of 1mg  tablet daily at  bedtime for a total daily dose of 3.5mg . What changed:    how much to take  how to take this  when to take this  additional instructions       Discharge Instructions: Please refer to Patient Instructions section of EMR for full details.  Patient was counseled important signs and symptoms that should prompt return to medical care, changes in medications, dietary instructions, activity restrictions, and follow up appointments.   Follow-Up  Appointments: No future appointments.   Wilber Oliphant, MD 11/09/2018, 6:48 PM PGY-1, [place]

## 2018-11-10 LAB — CULTURE, BLOOD (ROUTINE X 2)
Culture: NO GROWTH
Culture: NO GROWTH
Special Requests: ADEQUATE

## 2018-11-12 DIAGNOSIS — R5383 Other fatigue: Secondary | ICD-10-CM | POA: Diagnosis not present

## 2018-11-12 DIAGNOSIS — Z6825 Body mass index (BMI) 25.0-25.9, adult: Secondary | ICD-10-CM | POA: Diagnosis not present

## 2018-11-12 DIAGNOSIS — J189 Pneumonia, unspecified organism: Secondary | ICD-10-CM | POA: Diagnosis not present

## 2018-11-12 DIAGNOSIS — Z8673 Personal history of transient ischemic attack (TIA), and cerebral infarction without residual deficits: Secondary | ICD-10-CM | POA: Diagnosis not present

## 2018-11-12 DIAGNOSIS — J449 Chronic obstructive pulmonary disease, unspecified: Secondary | ICD-10-CM | POA: Diagnosis not present

## 2018-11-17 DIAGNOSIS — R5383 Other fatigue: Secondary | ICD-10-CM | POA: Diagnosis not present

## 2018-11-17 DIAGNOSIS — J4530 Mild persistent asthma, uncomplicated: Secondary | ICD-10-CM | POA: Diagnosis not present

## 2018-11-22 DIAGNOSIS — G4733 Obstructive sleep apnea (adult) (pediatric): Secondary | ICD-10-CM | POA: Diagnosis not present

## 2018-11-25 DIAGNOSIS — Z7901 Long term (current) use of anticoagulants: Secondary | ICD-10-CM | POA: Diagnosis not present

## 2018-12-01 DIAGNOSIS — R5383 Other fatigue: Secondary | ICD-10-CM | POA: Diagnosis not present

## 2018-12-01 DIAGNOSIS — J4530 Mild persistent asthma, uncomplicated: Secondary | ICD-10-CM | POA: Diagnosis not present

## 2018-12-10 DIAGNOSIS — D473 Essential (hemorrhagic) thrombocythemia: Secondary | ICD-10-CM | POA: Diagnosis not present

## 2018-12-15 DIAGNOSIS — D473 Essential (hemorrhagic) thrombocythemia: Secondary | ICD-10-CM | POA: Diagnosis not present

## 2018-12-15 DIAGNOSIS — D509 Iron deficiency anemia, unspecified: Secondary | ICD-10-CM | POA: Diagnosis not present

## 2018-12-17 DIAGNOSIS — C61 Malignant neoplasm of prostate: Secondary | ICD-10-CM | POA: Diagnosis not present

## 2018-12-17 DIAGNOSIS — R339 Retention of urine, unspecified: Secondary | ICD-10-CM | POA: Diagnosis not present

## 2018-12-17 DIAGNOSIS — R300 Dysuria: Secondary | ICD-10-CM | POA: Diagnosis not present

## 2018-12-22 DIAGNOSIS — D473 Essential (hemorrhagic) thrombocythemia: Secondary | ICD-10-CM | POA: Diagnosis not present

## 2019-01-11 DIAGNOSIS — D631 Anemia in chronic kidney disease: Secondary | ICD-10-CM | POA: Diagnosis not present

## 2019-01-11 DIAGNOSIS — I498 Other specified cardiac arrhythmias: Secondary | ICD-10-CM | POA: Diagnosis not present

## 2019-01-11 DIAGNOSIS — I129 Hypertensive chronic kidney disease with stage 1 through stage 4 chronic kidney disease, or unspecified chronic kidney disease: Secondary | ICD-10-CM | POA: Diagnosis not present

## 2019-01-11 DIAGNOSIS — I259 Chronic ischemic heart disease, unspecified: Secondary | ICD-10-CM | POA: Diagnosis not present

## 2019-01-11 DIAGNOSIS — I509 Heart failure, unspecified: Secondary | ICD-10-CM | POA: Diagnosis not present

## 2019-01-11 DIAGNOSIS — C61 Malignant neoplasm of prostate: Secondary | ICD-10-CM | POA: Diagnosis not present

## 2019-01-11 DIAGNOSIS — N2581 Secondary hyperparathyroidism of renal origin: Secondary | ICD-10-CM | POA: Diagnosis not present

## 2019-01-11 DIAGNOSIS — I639 Cerebral infarction, unspecified: Secondary | ICD-10-CM | POA: Diagnosis not present

## 2019-01-11 DIAGNOSIS — Z7901 Long term (current) use of anticoagulants: Secondary | ICD-10-CM | POA: Diagnosis not present

## 2019-01-11 DIAGNOSIS — M109 Gout, unspecified: Secondary | ICD-10-CM | POA: Diagnosis not present

## 2019-01-11 DIAGNOSIS — Z952 Presence of prosthetic heart valve: Secondary | ICD-10-CM | POA: Diagnosis not present

## 2019-01-11 DIAGNOSIS — N183 Chronic kidney disease, stage 3 (moderate): Secondary | ICD-10-CM | POA: Diagnosis not present

## 2019-01-12 DIAGNOSIS — Z6825 Body mass index (BMI) 25.0-25.9, adult: Secondary | ICD-10-CM | POA: Diagnosis not present

## 2019-01-12 DIAGNOSIS — J302 Other seasonal allergic rhinitis: Secondary | ICD-10-CM | POA: Diagnosis not present

## 2019-01-12 DIAGNOSIS — D509 Iron deficiency anemia, unspecified: Secondary | ICD-10-CM | POA: Diagnosis not present

## 2019-01-12 DIAGNOSIS — R5383 Other fatigue: Secondary | ICD-10-CM | POA: Diagnosis not present

## 2019-01-12 DIAGNOSIS — M109 Gout, unspecified: Secondary | ICD-10-CM | POA: Diagnosis not present

## 2019-01-14 DIAGNOSIS — D72829 Elevated white blood cell count, unspecified: Secondary | ICD-10-CM | POA: Diagnosis not present

## 2019-01-14 DIAGNOSIS — I2510 Atherosclerotic heart disease of native coronary artery without angina pectoris: Secondary | ICD-10-CM | POA: Diagnosis not present

## 2019-01-14 DIAGNOSIS — I10 Essential (primary) hypertension: Secondary | ICD-10-CM | POA: Diagnosis not present

## 2019-01-14 DIAGNOSIS — I4891 Unspecified atrial fibrillation: Secondary | ICD-10-CM | POA: Diagnosis not present

## 2019-01-14 DIAGNOSIS — D473 Essential (hemorrhagic) thrombocythemia: Secondary | ICD-10-CM | POA: Diagnosis not present

## 2019-01-14 DIAGNOSIS — R05 Cough: Secondary | ICD-10-CM | POA: Diagnosis not present

## 2019-01-14 DIAGNOSIS — Z03818 Encounter for observation for suspected exposure to other biological agents ruled out: Secondary | ICD-10-CM | POA: Diagnosis not present

## 2019-01-14 DIAGNOSIS — Z7901 Long term (current) use of anticoagulants: Secondary | ICD-10-CM | POA: Diagnosis not present

## 2019-01-14 DIAGNOSIS — E875 Hyperkalemia: Secondary | ICD-10-CM | POA: Diagnosis not present

## 2019-01-15 ENCOUNTER — Inpatient Hospital Stay (HOSPITAL_COMMUNITY)
Admission: AD | Admit: 2019-01-15 | Discharge: 2019-01-17 | DRG: 193 | Disposition: A | Discharge status: Home / Self Care | Payer: Medicare Other | Source: Other Acute Inpatient Hospital | Attending: Internal Medicine | Admitting: Internal Medicine

## 2019-01-15 DIAGNOSIS — Z7983 Long term (current) use of bisphosphonates: Secondary | ICD-10-CM

## 2019-01-15 DIAGNOSIS — Z20828 Contact with and (suspected) exposure to other viral communicable diseases: Secondary | ICD-10-CM | POA: Diagnosis present

## 2019-01-15 DIAGNOSIS — I10 Essential (primary) hypertension: Secondary | ICD-10-CM | POA: Diagnosis present

## 2019-01-15 DIAGNOSIS — M109 Gout, unspecified: Secondary | ICD-10-CM | POA: Diagnosis present

## 2019-01-15 DIAGNOSIS — I5032 Chronic diastolic (congestive) heart failure: Secondary | ICD-10-CM | POA: Diagnosis present

## 2019-01-15 DIAGNOSIS — E669 Obesity, unspecified: Secondary | ICD-10-CM | POA: Diagnosis present

## 2019-01-15 DIAGNOSIS — N400 Enlarged prostate without lower urinary tract symptoms: Secondary | ICD-10-CM | POA: Diagnosis present

## 2019-01-15 DIAGNOSIS — Z8673 Personal history of transient ischemic attack (TIA), and cerebral infarction without residual deficits: Secondary | ICD-10-CM | POA: Diagnosis not present

## 2019-01-15 DIAGNOSIS — K449 Diaphragmatic hernia without obstruction or gangrene: Secondary | ICD-10-CM | POA: Diagnosis present

## 2019-01-15 DIAGNOSIS — Z7901 Long term (current) use of anticoagulants: Secondary | ICD-10-CM | POA: Diagnosis not present

## 2019-01-15 DIAGNOSIS — Z950 Presence of cardiac pacemaker: Secondary | ICD-10-CM

## 2019-01-15 DIAGNOSIS — I2510 Atherosclerotic heart disease of native coronary artery without angina pectoris: Secondary | ICD-10-CM | POA: Diagnosis present

## 2019-01-15 DIAGNOSIS — J189 Pneumonia, unspecified organism: Principal | ICD-10-CM | POA: Diagnosis present

## 2019-01-15 DIAGNOSIS — Z8546 Personal history of malignant neoplasm of prostate: Secondary | ICD-10-CM

## 2019-01-15 DIAGNOSIS — E875 Hyperkalemia: Secondary | ICD-10-CM | POA: Diagnosis present

## 2019-01-15 DIAGNOSIS — K219 Gastro-esophageal reflux disease without esophagitis: Secondary | ICD-10-CM | POA: Diagnosis present

## 2019-01-15 DIAGNOSIS — G629 Polyneuropathy, unspecified: Secondary | ICD-10-CM | POA: Diagnosis present

## 2019-01-15 DIAGNOSIS — I480 Paroxysmal atrial fibrillation: Secondary | ICD-10-CM | POA: Diagnosis present

## 2019-01-15 DIAGNOSIS — Z7951 Long term (current) use of inhaled steroids: Secondary | ICD-10-CM

## 2019-01-15 DIAGNOSIS — D539 Nutritional anemia, unspecified: Secondary | ICD-10-CM | POA: Diagnosis not present

## 2019-01-15 DIAGNOSIS — I110 Hypertensive heart disease with heart failure: Secondary | ICD-10-CM | POA: Diagnosis present

## 2019-01-15 DIAGNOSIS — Z9049 Acquired absence of other specified parts of digestive tract: Secondary | ICD-10-CM

## 2019-01-15 DIAGNOSIS — D473 Essential (hemorrhagic) thrombocythemia: Secondary | ICD-10-CM | POA: Diagnosis present

## 2019-01-15 DIAGNOSIS — G4700 Insomnia, unspecified: Secondary | ICD-10-CM | POA: Diagnosis present

## 2019-01-15 DIAGNOSIS — J440 Chronic obstructive pulmonary disease with acute lower respiratory infection: Secondary | ICD-10-CM | POA: Diagnosis present

## 2019-01-15 DIAGNOSIS — J9601 Acute respiratory failure with hypoxia: Secondary | ICD-10-CM | POA: Diagnosis present

## 2019-01-15 DIAGNOSIS — Z79899 Other long term (current) drug therapy: Secondary | ICD-10-CM

## 2019-01-15 DIAGNOSIS — R05 Cough: Secondary | ICD-10-CM | POA: Diagnosis not present

## 2019-01-15 DIAGNOSIS — Z8249 Family history of ischemic heart disease and other diseases of the circulatory system: Secondary | ICD-10-CM

## 2019-01-15 DIAGNOSIS — E785 Hyperlipidemia, unspecified: Secondary | ICD-10-CM | POA: Diagnosis present

## 2019-01-15 DIAGNOSIS — G4734 Idiopathic sleep related nonobstructive alveolar hypoventilation: Secondary | ICD-10-CM | POA: Diagnosis present

## 2019-01-15 DIAGNOSIS — J449 Chronic obstructive pulmonary disease, unspecified: Secondary | ICD-10-CM

## 2019-01-15 DIAGNOSIS — I2582 Chronic total occlusion of coronary artery: Secondary | ICD-10-CM | POA: Diagnosis present

## 2019-01-15 DIAGNOSIS — Z952 Presence of prosthetic heart valve: Secondary | ICD-10-CM

## 2019-01-15 DIAGNOSIS — I4891 Unspecified atrial fibrillation: Secondary | ICD-10-CM | POA: Diagnosis not present

## 2019-01-15 LAB — BASIC METABOLIC PANEL
Anion gap: 10 (ref 5–15)
BUN: 21 mg/dL (ref 8–23)
CO2: 21 mmol/L — ABNORMAL LOW (ref 22–32)
Calcium: 8.8 mg/dL — ABNORMAL LOW (ref 8.9–10.3)
Chloride: 108 mmol/L (ref 98–111)
Creatinine, Ser: 1.27 mg/dL — ABNORMAL HIGH (ref 0.61–1.24)
GFR calc Af Amer: 56 mL/min — ABNORMAL LOW (ref >60)
GFR calc non Af Amer: 49 mL/min — ABNORMAL LOW (ref >60)
Glucose, Bld: 97 mg/dL (ref 70–99)
Potassium: 5.3 mmol/L — ABNORMAL HIGH (ref 3.5–5.1)
Sodium: 139 mmol/L (ref 135–145)

## 2019-01-15 LAB — CBC WITH DIFFERENTIAL/PLATELET
Abs Immature Granulocytes: 0.37 10*3/uL — ABNORMAL HIGH (ref 0.00–0.07)
Basophils Absolute: 0.1 10*3/uL (ref 0.0–0.1)
Basophils Relative: 1 %
Eosinophils Absolute: 0.1 10*3/uL (ref 0.0–0.5)
Eosinophils Relative: 1 %
HCT: 30.4 % — ABNORMAL LOW (ref 39.0–52.0)
Hemoglobin: 9.4 g/dL — ABNORMAL LOW (ref 13.0–17.0)
Immature Granulocytes: 3 %
Lymphocytes Relative: 9 %
Lymphs Abs: 1.3 10*3/uL (ref 0.7–4.0)
MCH: 34.4 pg — ABNORMAL HIGH (ref 26.0–34.0)
MCHC: 30.9 g/dL (ref 30.0–36.0)
MCV: 111.4 fL — ABNORMAL HIGH (ref 80.0–100.0)
Monocytes Absolute: 1.6 10*3/uL — ABNORMAL HIGH (ref 0.1–1.0)
Monocytes Relative: 11 %
Neutro Abs: 11.3 10*3/uL — ABNORMAL HIGH (ref 1.7–7.7)
Neutrophils Relative %: 75 %
Platelets: 1132 10*3/uL (ref 150–400)
RBC: 2.73 MIL/uL — ABNORMAL LOW (ref 4.22–5.81)
RDW: 15.7 % — ABNORMAL HIGH (ref 11.5–15.5)
WBC: 14.7 10*3/uL — ABNORMAL HIGH (ref 4.0–10.5)
nRBC: 0 % (ref 0.0–0.2)

## 2019-01-15 LAB — PROTIME-INR
INR: 1.7 — ABNORMAL HIGH (ref 0.8–1.2)
Prothrombin Time: 19.4 seconds — ABNORMAL HIGH (ref 11.4–15.2)

## 2019-01-15 MED ORDER — MOMETASONE FURO-FORMOTEROL FUM 100-5 MCG/ACT IN AERO
2.0000 | INHALATION_SPRAY | Freq: Two times a day (BID) | RESPIRATORY_TRACT | Status: DC
  Administered 2019-01-16 – 2019-01-17 (×3): 2 via RESPIRATORY_TRACT
  Filled 2019-01-15: qty 8.8

## 2019-01-15 MED ORDER — METOPROLOL TARTRATE 25 MG PO TABS
25.0000 mg | ORAL_TABLET | Freq: Two times a day (BID) | ORAL | Status: DC
  Administered 2019-01-15 – 2019-01-17 (×4): 25 mg via ORAL
  Filled 2019-01-15 (×4): qty 1

## 2019-01-15 MED ORDER — DILTIAZEM HCL ER COATED BEADS 120 MG PO CP24
120.0000 mg | ORAL_CAPSULE | Freq: Every day | ORAL | Status: DC
  Administered 2019-01-16 – 2019-01-17 (×2): 120 mg via ORAL
  Filled 2019-01-15 (×2): qty 1

## 2019-01-15 MED ORDER — TAMSULOSIN HCL 0.4 MG PO CAPS
0.4000 mg | ORAL_CAPSULE | Freq: Every day | ORAL | Status: DC
  Administered 2019-01-15 – 2019-01-16 (×2): 0.4 mg via ORAL
  Filled 2019-01-15 (×2): qty 1

## 2019-01-15 MED ORDER — ONDANSETRON HCL 4 MG PO TABS: 4.0000 mg | ORAL_TABLET | Freq: Four times a day (QID) | ORAL | Status: DC | PRN

## 2019-01-15 MED ORDER — WARFARIN SODIUM 2 MG PO TABS
3.0000 mg | ORAL_TABLET | Freq: Once | ORAL | Status: AC
  Administered 2019-01-15: 3 mg via ORAL
  Filled 2019-01-15: qty 1

## 2019-01-15 MED ORDER — SODIUM CHLORIDE 0.9% FLUSH
3.0000 mL | Freq: Two times a day (BID) | INTRAVENOUS | Status: DC
  Administered 2019-01-16 – 2019-01-17 (×3): 3 mL via INTRAVENOUS

## 2019-01-15 MED ORDER — ONDANSETRON HCL 4 MG/2ML IJ SOLN: 4.0000 mg | Freq: Four times a day (QID) | INTRAMUSCULAR | Status: DC | PRN

## 2019-01-15 MED ORDER — ALBUTEROL SULFATE HFA 108 (90 BASE) MCG/ACT IN AERS
1.0000 | INHALATION_SPRAY | RESPIRATORY_TRACT | Status: DC | PRN
  Administered 2019-01-15 – 2019-01-16 (×2): 1 via RESPIRATORY_TRACT
  Filled 2019-01-15: qty 6.7

## 2019-01-15 MED ORDER — SODIUM CHLORIDE 0.9 % IV SOLN
1.0000 g | INTRAVENOUS | Status: DC
  Administered 2019-01-15 – 2019-01-16 (×2): 1 g via INTRAVENOUS
  Filled 2019-01-15 (×4): qty 10

## 2019-01-15 MED ORDER — SODIUM CHLORIDE 0.9 % IV SOLN
500.0000 mg | INTRAVENOUS | Status: DC
  Administered 2019-01-15 – 2019-01-16 (×2): 500 mg via INTRAVENOUS
  Filled 2019-01-15 (×3): qty 500

## 2019-01-15 MED ORDER — WARFARIN - PHARMACIST DOSING INPATIENT: Freq: Every day | Status: DC

## 2019-01-15 MED ORDER — IPRATROPIUM BROMIDE HFA 17 MCG/ACT IN AERS
2.0000 | INHALATION_SPRAY | RESPIRATORY_TRACT | Status: DC | PRN
  Administered 2019-01-16: 2 via RESPIRATORY_TRACT
  Filled 2019-01-15: qty 12.9

## 2019-01-15 MED ORDER — HYDROXYUREA 500 MG PO CAPS
500.0000 mg | ORAL_CAPSULE | Freq: Two times a day (BID) | ORAL | Status: DC
  Administered 2019-01-15 – 2019-01-17 (×3): 500 mg via ORAL
  Filled 2019-01-15 (×5): qty 1

## 2019-01-15 MED ORDER — SODIUM CHLORIDE 0.9% FLUSH: 3.0000 mL | INTRAVENOUS | Status: DC | PRN

## 2019-01-15 MED ORDER — ACETAMINOPHEN 325 MG PO TABS
650.0000 mg | ORAL_TABLET | Freq: Four times a day (QID) | ORAL | Status: DC | PRN
  Administered 2019-01-17: 15:00:00 650 mg via ORAL
  Filled 2019-01-15 (×2): qty 2

## 2019-01-15 MED ORDER — SODIUM CHLORIDE 0.9 % IV SOLN: 250.0000 mL | INTRAVENOUS | Status: DC | PRN

## 2019-01-15 NOTE — Progress Notes (Addendum)
CRITICAL VALUE ALERT  Critical Value:  Platelet 1132  Date & Time Notied:  01/15/19  1705  Provider Notified: Maylene Roes, MD  Orders Received/Actions taken: MD called back, said it's chronically high, pt should be on coumadin per MD order.

## 2019-01-15 NOTE — Progress Notes (Addendum)
Pt arrived to unit from [place], oriented to unit, has 2 IVs on left arm, paged MD to admit pt.

## 2019-01-15 NOTE — Progress Notes (Signed)
ANTICOAGULATION CONSULT NOTE - Initial Consult  Pharmacy Consult for Warfarin Indication: atrial fibrillation  No Known Allergies  Patient Measurements:   Vital Signs: Temp: 98.3 F (36.8 C) (04/11 1356) Temp Source: Oral (04/11 1356) BP: 149/77 (04/11 1356) Pulse Rate: 94 (04/11 1356)  Labs: Recent Labs    01/15/19 1619 01/15/19 1814  HGB 9.4*  --   HCT 30.4*  --   PLT 1,132*  --   LABPROT  --  19.4*  INR  --  1.7*  CREATININE 1.27*  --     CrCl cannot be calculated (Unknown ideal weight.).   Medical History: Past Medical History:  Diagnosis Date  . Ac isch multi vasc territories stroke ([place]) 10 yrs ago  . Anemia   . Aortic stenosis    a. s/p TAVR in 08/2016; b. followed by [facility]  . Asthma   . Blood dyscrasia   . CAD (coronary artery disease)    a. [place] 06/2016 with CTO of the RCA otherwise without obstructive CAD  . CHF (congestive heart failure) ([place])   . Chronic kidney disease   . COPD (chronic obstructive pulmonary disease) ([place])   . Dyspnea   . Dysrhythmia   . Environmental allergies   . Essential thrombocytopenia ([place])    followed by Dr Bobby Rumpf (hematology)  . GERD (gastroesophageal reflux disease)   . Gout   . Heart murmur   . History of hiatal hernia   . Hyperlipidemia   . Hypertension   . Hypoxia, sleep related   . Insomnia   . Neuropathy   . Obesity   . Osteoarthritis   . PAF (paroxysmal atrial fibrillation) ([place])    a. On Coumadin; b. followed by PCP; c. CHADS2VASc => 4 (HTN, age x 2, vascular disease)  . Presence of permanent cardiac pacemaker   . Prostate cancer [facility])    s/p seed implants     Assessment: 89yom admitted for cough/SOB.  Has Hx Afib on warfarin 3mg  daily PTA.  Admit INR 1.7, h/h stable - elevated PLTC 1100   Goal of Therapy:  INR 2-3 Monitor platelets by anticoagulation protocol: Yes   Plan:  Warfarin 3mg  x1 tonight  Daily INR Monitor s/s bleeding   Bonnita Nasuti Pharm.D. CPP, BCPS Clinical  Pharmacist [phone] 01/15/2019 7:06 PM

## 2019-01-15 NOTE — H&P (Signed)
History and Physical    Dean [name] ZSW:[id number] DOB: July 27, 1924 DOA: 01/15/2019  PCP: Angelina Sheriff, MD  Patient coming from: [facility] ED   Chief Complaint: Cough productive, shortness of breath, generalized weakness, fever 101.  HPI: Dean [name] is a 85 y.o. male with medical history significant of 1 day history of cough productive of clear sputum, shortness of breath, generalized weakness, fever 101.  He had no known contacts who have been sick.  He denies any chest pain, nausea, vomiting, abdominal pain or diarrhea.  Currently, he feels cold but is not having any complaints.  He does not wear oxygen at home.  [facility] ED work up: WBC 20.5 Hgb 9.6 Platelet 1421 INR 1.7  Sodium 138 Potassium 6.1 BUN 28 Creatinine 1.3 Troponin negative proBNP 4370 LDH 843 AST 25 ALT 12 Lipase 32 Lactic acid 1.2 Procalcitonin 0.09 Ferritin 126 Influenza negative Chest x-ray minimal right basilar subsegmental atelectasis or scarring with possible minimal right pleural effusion EKG independently reviewed which reveals normal sinus rhythm with right bundle branch block  Review of Systems: As per HPI otherwise 10 point review of systems negative.   Past Medical History:  Diagnosis Date  . Ac isch multi vasc territories stroke ([place]) 10 yrs ago  . Anemia   . Aortic stenosis    a. s/p TAVR in 08/2016; b. [facility]  . Asthma   . Blood dyscrasia   . CAD (coronary artery disease)    a. [place] 06/2016 with CTO of the RCA otherwise without obstructive CAD  . CHF (congestive heart failure) ([place])   . Chronic kidney disease   . COPD (chronic obstructive pulmonary disease) ([place])   . Dyspnea   . Dysrhythmia   . Environmental allergies   . Essential thrombocytopenia ([place])    followed by Dr Bobby Rumpf (hematology)  . GERD (gastroesophageal reflux disease)   . Gout   . Heart murmur   . History of hiatal hernia   . Hyperlipidemia   . Hypertension   . Hypoxia, sleep  related   . Insomnia   . Neuropathy   . Obesity   . Osteoarthritis   . PAF (paroxysmal atrial fibrillation) ([place])    a. On Coumadin; b. followed by PCP; c. CHADS2VASc => 4 (HTN, age x 2, vascular disease)  . Presence of permanent cardiac pacemaker   . Prostate cancer [facility])    s/p seed implants    Past Surgical History:  Procedure Laterality Date  . CARDIAC VALVE REPLACEMENT    . CHOLECYSTECTOMY    . ESOPHAGOGASTRODUODENOSCOPY N/A 01/10/2018   Procedure: ESOPHAGOGASTRODUODENOSCOPY (EGD);  Surgeon: Carol Ada, MD;  Location: [place];  Service: Endoscopy;  Laterality: N/A;  . EYE SURGERY    . INSERT / REPLACE / REMOVE PACEMAKER    . POLYPECTOMY    . TESTICLE SURGERY    . TONSILLECTOMY    . TUMOR EXCISION     on back     reports that he has never smoked. He has never used smokeless tobacco. He reports that he does not drink alcohol or use drugs.  No Known Allergies  Family History  Problem Relation Age of Onset  . Asthma Maternal Grandmother   . Tuberculosis Maternal Grandmother   . Heart disease Father        died of heart disease  . Alzheimer's disease Mother     Prior to Admission medications   Medication Sig Start Date End Date Taking? Authorizing Provider  acetaminophen (TYLENOL)  500 MG tablet Take 500 mg by mouth every 6 (six) hours as needed for headache (pain).     [provider]  albuterol (PROVENTIL) (2.5 MG/3ML) 0.083% nebulizer solution Take 2.5 mg by nebulization 2 (two) times daily.    [provider]  budesonide (PULMICORT) 0.25 MG/2ML nebulizer solution Take 0.25 mg by nebulization 2 (two) times daily.    [provider]  calcitRIOL (ROCALTROL) 0.25 MCG capsule Take 0.25 mcg by mouth every other day.  10/13/17   [provider]  Calcium Carbonate Antacid (TUMS PO) Take 2 tablets by mouth daily as needed (heartburn/indigestion).    [provider]  diltiazem (CARDIZEM CD) 120 MG 24 hr capsule Take 120 mg by  mouth daily. 11/05/17   [provider]  fexofenadine (ALLEGRA) 180 MG tablet Take 180 mg by mouth daily as needed for allergies.     [provider]  fluticasone (FLONASE) 50 MCG/ACT nasal spray Place 1 spray into both nostrils 2 (two) times daily.    [provider]  gabapentin (NEURONTIN) 300 MG capsule Take 300 mg by mouth at bedtime.  10/27/18   [provider]  guaiFENesin (MUCINEX) 600 MG 12 hr tablet Take 600 mg by mouth 2 (two) times daily.     [provider]  hydroxyurea (HYDREA) 500 MG capsule Take 500 mg by mouth 2 (two) times daily.     [provider]  lubiprostone (AMITIZA) 24 MCG capsule Take 24 mcg by mouth 2 (two) times daily as needed for constipation.    [provider]  metoprolol tartrate (LOPRESSOR) 25 MG tablet Take 25 mg by mouth 2 (two) times daily.  10/13/17   [provider]  montelukast (SINGULAIR) 10 MG tablet Take 1 tablet (10 mg total) by mouth at bedtime. Patient not taking: Reported on 11/05/2018 01/13/18   Katherine Roan, MD  Tamsulosin HCl (FLOMAX) 0.4 MG CAPS Take 0.4 mg by mouth at bedtime.     [provider]  warfarin (COUMADIN) 3 MG tablet Take 1/2 tablet (1.5 mg) by mouth with 1/2 1 mg tablet daily at bedtime for a total daily dose of 2.5 mg for 6 days while on fluconazole.  After this 6 day course, please take your regular dose of 1 tablet by mouth with 1/2 of 1mg  tablet daily at bedtime for a total daily dose of 3.5mg . Patient taking differently: Take 3 mg by mouth at bedtime.  01/13/18   Katherine Roan, MD    Physical Exam: Vitals:   01/15/19 1356  BP: (!) 149/77  Pulse: 94  Resp: 18  Temp: 98.3 F (36.8 C)  TempSrc: Oral  SpO2: 99%     Constitutional: NAD, calm, comfortable Eyes: PERRL, lids and conjunctivae normal ENMT: Mucous membranes are moist. Posterior pharynx clear of any exudate or lesions.Normal dentition.  Neck: normal, supple, no masses, no  thyromegaly Respiratory: Expiratory wheezes bilaterally, normal respiratory effort, on 2 L nasal cannula O2, no conversational dyspnea Cardiovascular: Tachycardic, regular, no peripheral edema Abdomen: no tenderness, no masses palpated. No hepatosplenomegaly. Bowel sounds positive.  Musculoskeletal: no clubbing / cyanosis. No joint deformity upper and lower extremities. Good ROM, no contractures. Normal muscle tone.  Skin: no rashes, lesions, ulcers on exposed skin Neurologic: Nonfocal exam, speech clear Psychiatric: Normal judgment and insight. Alert and oriented x 3. Normal mood.   Labs on Admission: I have personally reviewed following labs and imaging studies  CBC: No results for input(s): WBC, NEUTROABS, HGB, HCT, MCV,  PLT in the last 168 hours. Basic Metabolic Panel: No results for input(s): NA, K, CL, CO2, GLUCOSE, BUN, CREATININE, CALCIUM, MG, PHOS in the last 168 hours. GFR: CrCl cannot be calculated (Patient's most recent lab result is older than the maximum 21 days allowed.). Liver Function Tests: No results for input(s): AST, ALT, ALKPHOS, BILITOT, PROT, ALBUMIN in the last 168 hours. No results for input(s): LIPASE, AMYLASE in the last 168 hours. No results for input(s): AMMONIA in the last 168 hours. Coagulation Profile: No results for input(s): INR, PROTIME in the last 168 hours. Cardiac Enzymes: No results for input(s): CKTOTAL, CKMB, CKMBINDEX, TROPONINI in the last 168 hours. BNP (last 3 results) No results for input(s): PROBNP in the last 8760 hours. HbA1C: No results for input(s): HGBA1C in the last 72 hours. CBG: No results for input(s): GLUCAP in the last 168 hours. Lipid Profile: No results for input(s): CHOL, HDL, LDLCALC, TRIG, CHOLHDL, LDLDIRECT in the last 72 hours. Thyroid Function Tests: No results for input(s): TSH, T4TOTAL, FREET4, T3FREE, THYROIDAB in the last 72 hours. Anemia Panel: No results for input(s): VITAMINB12, FOLATE, FERRITIN, TIBC, IRON,  RETICCTPCT in the last 72 hours. Urine analysis:    Component Value Date/Time   COLORURINE YELLOW 01/12/2018 1001   APPEARANCEUR CLEAR 01/12/2018 1001   LABSPEC 1.020 01/12/2018 1001   PHURINE 5.0 01/12/2018 1001   GLUCOSEU NEGATIVE 01/12/2018 1001   HGBUR NEGATIVE 01/12/2018 1001   BILIRUBINUR NEGATIVE 01/12/2018 1001   KETONESUR NEGATIVE 01/12/2018 1001   PROTEINUR NEGATIVE 01/12/2018 1001   NITRITE NEGATIVE 01/12/2018 1001   LEUKOCYTESUR NEGATIVE 01/12/2018 1001   Sepsis Labs: !!!!!!!!!!!!!!!!!!!!!!!!!!!!!!!!!!!!!!!!!!!! @LABRCNTIP (procalcitonin:4,lacticidven:4) )No results found for this or any previous visit (from the past 240 hour(s)).   Radiological Exams on Admission: No results found.   Assessment/Plan Principal Problem:   Acute hypoxemic respiratory failure (HCC) Active Problems:   PAF (paroxysmal atrial fibrillation) (HCC)   Essential thrombocytosis (HCC)   COPD (chronic obstructive pulmonary disease) (HCC)  Acute hypoxemic respiratory failure -Chest x-ray minimal right basilar subsegmental atelectasis or scarring with possible minimal right pleural effusion -COVID-19 test ordered at [facility] and pending at this time  -Rocephin/Azithromax for CAP coverage   COPD -Has minimal wheezes bilaterally -Albuterol, Dulera, ipratropium inhalers at bedside, minimize nebulizer treatments until COVID ruled out  Paroxysmal atrial fibrillation -Continue Coumadin -Cardizem, Lopressor   BPH -Flomax   Chronic thrombocytosis -Follows with Hematology Dr. Bobby Rumpf  -Hydroxyurea   Hyperkalemia -K 6.1 at [facility], stat repeat BMP ordered   DVT prophylaxis: Coumadin  Code Status: Full code, MOST form available   Family Communication: Spoke with daughter over the phone Disposition Plan: Pending improvement Consults called: None   Admission status: Inpatient   Severity of Illness: The appropriate patient status for this patient is INPATIENT. Inpatient status is  judged to be reasonable and necessary in order to provide the required intensity of service to ensure the patient's safety. The patient's presenting symptoms, physical exam findings, and initial radiographic and laboratory data in the context of their chronic comorbidities is felt to place them at high risk for further clinical deterioration. Furthermore, it is not anticipated that the patient will be medically stable for discharge from the hospital within 2 midnights of admission. The following factors support the patient status of inpatient.   " The patient's presenting symptoms include fever, cough, shortness of breath. " The worrisome physical exam findings include wheezing. " The initial radiographic and laboratory data are worrisome because of right basilar atelectasis, possible  pleural effusion, leukocytosis, hyperkalemia " The chronic co-morbidities include paroxysmal atrial fibrillation, COPD, chronic thrombocytosis.   * I certify that at the point of admission it is my clinical judgment that the patient will require inpatient hospital care spanning beyond 2 midnights from the point of admission due to high intensity of service, high risk for further deterioration and high frequency of surveillance required.Dessa Phi, DO [facility] 01/15/2019, 4:10 PM    How to contact the [facility] Attending or Consulting provider [place] or covering provider during after hours [place], for this patient?  1. Check the care team in [facility] and look for a) attending/consulting TRH provider listed and b) the [facility] team listed 2. Log into [email or web address] and use [place]'s universal password to access. If you do not have the password, please contact the hospital operator. 3. Locate the [facility] provider you are looking for under [facility] and page to a number that you can be directly reached. 4. If you still have difficulty reaching the provider, please page the [facility] (Director on Call) for the  Hospitalists listed on amion for assistance.

## 2019-01-16 LAB — CBC WITH DIFFERENTIAL/PLATELET
Abs Immature Granulocytes: 0.38 10*3/uL — ABNORMAL HIGH (ref 0.00–0.07)
Basophils Absolute: 0.1 10*3/uL (ref 0.0–0.1)
Basophils Relative: 1 %
Eosinophils Absolute: 0.1 10*3/uL (ref 0.0–0.5)
Eosinophils Relative: 1 %
HCT: 30 % — ABNORMAL LOW (ref 39.0–52.0)
Hemoglobin: 9.3 g/dL — ABNORMAL LOW (ref 13.0–17.0)
Immature Granulocytes: 3 %
Lymphocytes Relative: 13 %
Lymphs Abs: 1.5 10*3/uL (ref 0.7–4.0)
MCH: 33.7 pg (ref 26.0–34.0)
MCHC: 31 g/dL (ref 30.0–36.0)
MCV: 108.7 fL — ABNORMAL HIGH (ref 80.0–100.0)
Monocytes Absolute: 1.6 10*3/uL — ABNORMAL HIGH (ref 0.1–1.0)
Monocytes Relative: 14 %
Neutro Abs: 7.9 10*3/uL — ABNORMAL HIGH (ref 1.7–7.7)
Neutrophils Relative %: 68 %
Platelets: 1080 10*3/uL (ref 150–400)
RBC: 2.76 MIL/uL — ABNORMAL LOW (ref 4.22–5.81)
RDW: 15.5 % (ref 11.5–15.5)
WBC: 11.6 10*3/uL — ABNORMAL HIGH (ref 4.0–10.5)
nRBC: 0 % (ref 0.0–0.2)

## 2019-01-16 LAB — COMPREHENSIVE METABOLIC PANEL
ALT: 12 U/L (ref 0–44)
AST: 21 U/L (ref 15–41)
Albumin: 2.9 g/dL — ABNORMAL LOW (ref 3.5–5.0)
Alkaline Phosphatase: 64 U/L (ref 38–126)
Anion gap: 11 (ref 5–15)
BUN: 23 mg/dL (ref 8–23)
CO2: 25 mmol/L (ref 22–32)
Calcium: 9 mg/dL (ref 8.9–10.3)
Chloride: 102 mmol/L (ref 98–111)
Creatinine, Ser: 1.36 mg/dL — ABNORMAL HIGH (ref 0.61–1.24)
GFR calc Af Amer: 52 mL/min — ABNORMAL LOW (ref >60)
GFR calc non Af Amer: 45 mL/min — ABNORMAL LOW (ref >60)
Glucose, Bld: 91 mg/dL (ref 70–99)
Potassium: 4.5 mmol/L (ref 3.5–5.1)
Sodium: 138 mmol/L (ref 135–145)
Total Bilirubin: 0.5 mg/dL (ref 0.3–1.2)
Total Protein: 6.2 g/dL — ABNORMAL LOW (ref 6.5–8.1)

## 2019-01-16 LAB — PROTIME-INR
INR: 1.7 — ABNORMAL HIGH (ref 0.8–1.2)
Prothrombin Time: 20 seconds — ABNORMAL HIGH (ref 11.4–15.2)

## 2019-01-16 MED ORDER — WARFARIN SODIUM 4 MG PO TABS
4.0000 mg | ORAL_TABLET | Freq: Once | ORAL | Status: AC
  Administered 2019-01-16: 16:00:00 4 mg via ORAL
  Filled 2019-01-16: qty 1

## 2019-01-16 MED ORDER — AZITHROMYCIN 250 MG PO TABS
500.0000 mg | ORAL_TABLET | Freq: Every day | ORAL | Status: DC
  Administered 2019-01-16 – 2019-01-17 (×2): 500 mg via ORAL
  Filled 2019-01-16 (×2): qty 2

## 2019-01-16 NOTE — Progress Notes (Signed)
ANTICOAGULATION CONSULT NOTE -follow up  Pharmacy Consult for Warfarin Indication: atrial fibrillation  No Known Allergies  Patient Measurements:   Vital Signs: Temp: 97.9 F (36.6 C) (04/12 0428) Temp Source: Oral (04/12 0428) BP: 135/76 (04/12 0428) Pulse Rate: 85 (04/12 0428)  Labs: Recent Labs    01/15/19 1619 01/15/19 1814 01/16/19 0537  HGB 9.4*  --  9.3*  HCT 30.4*  --  30.0*  PLT 1,132*  --  1,080*  LABPROT  --  19.4* 20.0*  INR  --  1.7* 1.7*  CREATININE 1.27*  --  1.36*    Estimated Creatinine Clearance: 33.5 mL/min (A) (by C-G formula based on SCr of 1.36 mg/dL (H)).   Medical History: Past Medical History:  Diagnosis Date  . Ac isch multi vasc territories stroke ([place]) 10 yrs ago  . Anemia   . Aortic stenosis    a. s/p TAVR in 08/2016; b. followed by [facility]  . Asthma   . Blood dyscrasia   . CAD (coronary artery disease)    a. [place] 06/2016 with CTO of the RCA otherwise without obstructive CAD  . CHF (congestive heart failure) ([place])   . Chronic kidney disease   . COPD (chronic obstructive pulmonary disease) ([place])   . Dyspnea   . Dysrhythmia   . Environmental allergies   . Essential thrombocytopenia ([place])    followed by Dr Bobby Rumpf (hematology)  . GERD (gastroesophageal reflux disease)   . Gout   . Heart murmur   . History of hiatal hernia   . Hyperlipidemia   . Hypertension   . Hypoxia, sleep related   . Insomnia   . Neuropathy   . Obesity   . Osteoarthritis   . PAF (paroxysmal atrial fibrillation) ([place])    a. On Coumadin; b. followed by PCP; c. CHADS2VASc => 4 (HTN, age x 2, vascular disease)  . Presence of permanent cardiac pacemaker   . Prostate cancer [facility])    s/p seed implants     Assessment: 89yom admitted for cough/SOB.  Has Hx Afib on warfarin 3mg  daily PTA.  Admit INR 1.7,  INR remains at 1.7 today. H/H stable - elevated PLTC 1132>1080,  Hydroxyurea resumed. No bleeding reported.    Goal of Therapy:  INR 2-3 Monitor platelets  by anticoagulation protocol: Yes   Plan:  Warfarin 4mg  x1 tonight  Daily INR Monitor s/s bleeding   Nicole Cella, RPh Clinical Pharmacist Pager: [phone] Please check AMION for all [place] phone numbers After 10:00 PM, call [place] [phone] 01/16/2019 8:43 AM

## 2019-01-16 NOTE — Plan of Care (Signed)

## 2019-01-16 NOTE — Progress Notes (Signed)
[facility] Progress Note  Patient: Dean [name] [id number]   PCP: Angelina Sheriff, MD DOB: 1924/07/01   DOA: 01/15/2019   DOS: 01/16/2019   Date of Service: the patient was seen and examined on 01/16/2019  Brief hospital course: Pt. with PMH of aortic stenosis S/P TAVR, CAD, chronic diastolic CHF, COPD, essential thrombocytosis, GERD, CVA, HLD, HTN; admitted on 01/15/2019, presented with complaint of cough and shortness of breath, was found to have community-acquired pneumonia. Currently further plan is continue to biotics.  Subjective: No nausea no vomiting.  Fever resolved.  No breathing issues.  Cough better.  No diarrhea.  Assessment and Plan: Acute hypoxemic respiratory failure -Chest x-ray minimal right basilar subsegmental atelectasis or scarring with possible minimal right pleural effusion -COVID-19 test ordered at [facility] and pending at this time  -Rocephin/Azithromax for CAP coverage   COPD -Has minimal wheezes bilaterally -Albuterol, Dulera, ipratropium inhalers at bedside, minimize nebulizer treatments until COVID ruled out  Paroxysmal atrial fibrillation -Continue Coumadin -Cardizem, Lopressor   BPH -Flomax   Chronic thrombocytosis -Follows with Hematology Dr. Bobby Rumpf  -Hydroxyurea   Hyperkalemia -K 6.1 at [facility], normal here.   Diet: cardiac diet DVT Prophylaxis: subcutaneous Heparin  Advance goals of care discussion: full code  Family Communication: no family was present at bedside, at the time of interview.   Disposition:  Discharge to home.  Consultants: none Procedures: none  Scheduled Meds: . diltiazem  120 mg Oral Daily  . hydroxyurea  500 mg Oral BID  . metoprolol tartrate  25 mg Oral BID  . mometasone-formoterol  2 puff Inhalation BID  . sodium chloride flush  3 mL Intravenous Q12H  . tamsulosin  0.4 mg Oral QHS  . warfarin  4 mg Oral ONCE-1800  . Warfarin - Pharmacist Dosing Inpatient   Does not apply  q1800   Continuous Infusions: . sodium chloride    . azithromycin Stopped (01/15/19 1915)  . cefTRIAXone (ROCEPHIN)  IV Stopped (01/15/19 1858)   PRN Meds: sodium chloride, acetaminophen, albuterol, ipratropium, ondansetron **OR** ondansetron (ZOFRAN) IV, sodium chloride flush Antibiotics: Anti-infectives (From admission, onward)   Start     Dose/Rate Route Frequency Ordered Stop   01/15/19 1700  azithromycin (ZITHROMAX) 500 mg in sodium chloride 0.9 % 250 mL IVPB     500 mg 250 mL/hr over 60 Minutes Intravenous Every 24 hours 01/15/19 1605     01/15/19 1645  cefTRIAXone (ROCEPHIN) 1 g in sodium chloride 0.9 % 100 mL IVPB     1 g 200 mL/hr over 30 Minutes Intravenous Every 24 hours 01/15/19 1605         Objective: Physical Exam: Vitals:   01/16/19 0434 01/16/19 0802 01/16/19 0841 01/16/19 0940  BP:   111/72   Pulse:   90 82  Resp:      Temp:      TempSrc:      SpO2:  99% 92% 98%  Weight: 76.7 kg       Intake/Output Summary (Last 24 hours) at 01/16/2019 1526 Last data filed at 01/16/2019 1400 Gross per 24 hour  Intake 1069.93 ml  Output 300 ml  Net 769.93 ml   Filed Weights   01/16/19 0434  Weight: 76.7 kg   General: Alert, Awake and Oriented to Time, Place and Person. Appear in mild distress, affect appropriate Eyes: PERRL, Conjunctiva normal ENT: Oral Mucosa clear moist. Neck: no JVD, no Abnormal Mass Or lumps Cardiovascular: S1 and S2 Present, no Murmur, Peripheral Pulses Present  Respiratory: normal respiratory effort, Bilateral Air entry equal and Decreased, no use of accessory muscle, Clear to Auscultation, no Crackles, no wheezes Abdomen: Bowel Sound present, Soft and no tenderness, no hernia Skin: no redness, no Rash, no induration Extremities: no Pedal edema, no calf tenderness Neurologic: Grossly no focal neuro deficit. Bilaterally Equal motor strength  Data Reviewed: CBC: Recent Labs  Lab 01/15/19 1619 01/16/19 0537  WBC 14.7* 11.6*  NEUTROABS  11.3* 7.9*  HGB 9.4* 9.3*  HCT 30.4* 30.0*  MCV 111.4* 108.7*  PLT 1,132* 9,937*   Basic Metabolic Panel: Recent Labs  Lab 01/15/19 1619 01/16/19 0537  NA 139 138  K 5.3* 4.5  CL 108 102  CO2 21* 25  GLUCOSE 97 91  BUN 21 23  CREATININE 1.27* 1.36*  CALCIUM 8.8* 9.0    Liver Function Tests: Recent Labs  Lab 01/16/19 0537  AST 21  ALT 12  ALKPHOS 64  BILITOT 0.5  PROT 6.2*  ALBUMIN 2.9*   No results for input(s): LIPASE, AMYLASE in the last 168 hours. No results for input(s): AMMONIA in the last 168 hours. Coagulation Profile: Recent Labs  Lab 01/15/19 1814 01/16/19 0537  INR 1.7* 1.7*   Cardiac Enzymes: No results for input(s): CKTOTAL, CKMB, CKMBINDEX, TROPONINI in the last 168 hours. BNP (last 3 results) No results for input(s): PROBNP in the last 8760 hours. CBG: No results for input(s): GLUCAP in the last 168 hours. Studies: No results found.   Time spent: 35 minutes  Author: Berle Mull, MD [facility] 01/16/2019 3:26 PM  To reach On-call, see care teams to locate the attending and reach out to them via [email or web address]. If 7PM-7AM, please contact night-coverage If you still have difficulty reaching the attending provider, please page the [facility] (Director on Call) for [facility] on amion for assistance.

## 2019-01-16 NOTE — Progress Notes (Signed)
Pt stated he's been off hydroxyurea, refused to take it this morning. RN let pharm tech know to update home med list again.

## 2019-01-17 ENCOUNTER — Other Ambulatory Visit: Payer: Self-pay

## 2019-01-17 LAB — CBC WITH DIFFERENTIAL/PLATELET
Abs Immature Granulocytes: 0.28 10*3/uL — ABNORMAL HIGH (ref 0.00–0.07)
Basophils Absolute: 0.1 10*3/uL (ref 0.0–0.1)
Basophils Relative: 1 %
Eosinophils Absolute: 0.2 10*3/uL (ref 0.0–0.5)
Eosinophils Relative: 2 %
HCT: 31.2 % — ABNORMAL LOW (ref 39.0–52.0)
Hemoglobin: 9.7 g/dL — ABNORMAL LOW (ref 13.0–17.0)
Immature Granulocytes: 3 %
Lymphocytes Relative: 18 %
Lymphs Abs: 1.8 10*3/uL (ref 0.7–4.0)
MCH: 33.1 pg (ref 26.0–34.0)
MCHC: 31.1 g/dL (ref 30.0–36.0)
MCV: 106.5 fL — ABNORMAL HIGH (ref 80.0–100.0)
Monocytes Absolute: 1.2 10*3/uL — ABNORMAL HIGH (ref 0.1–1.0)
Monocytes Relative: 13 %
Neutro Abs: 6.2 10*3/uL (ref 1.7–7.7)
Neutrophils Relative %: 63 %
Platelets: 1153 10*3/uL (ref 150–400)
RBC: 2.93 MIL/uL — ABNORMAL LOW (ref 4.22–5.81)
RDW: 15.2 % (ref 11.5–15.5)
WBC: 9.7 10*3/uL (ref 4.0–10.5)
nRBC: 0 % (ref 0.0–0.2)

## 2019-01-17 LAB — COMPREHENSIVE METABOLIC PANEL
ALT: 13 U/L (ref 0–44)
AST: 20 U/L (ref 15–41)
Albumin: 3 g/dL — ABNORMAL LOW (ref 3.5–5.0)
Alkaline Phosphatase: 65 U/L (ref 38–126)
Anion gap: 13 (ref 5–15)
BUN: 32 mg/dL — ABNORMAL HIGH (ref 8–23)
CO2: 24 mmol/L (ref 22–32)
Calcium: 9 mg/dL (ref 8.9–10.3)
Chloride: 102 mmol/L (ref 98–111)
Creatinine, Ser: 1.38 mg/dL — ABNORMAL HIGH (ref 0.61–1.24)
GFR calc Af Amer: 51 mL/min — ABNORMAL LOW (ref >60)
GFR calc non Af Amer: 44 mL/min — ABNORMAL LOW (ref >60)
Glucose, Bld: 96 mg/dL (ref 70–99)
Potassium: 4.5 mmol/L (ref 3.5–5.1)
Sodium: 139 mmol/L (ref 135–145)
Total Bilirubin: 0.5 mg/dL (ref 0.3–1.2)
Total Protein: 6.4 g/dL — ABNORMAL LOW (ref 6.5–8.1)

## 2019-01-17 LAB — D-DIMER, QUANTITATIVE: D-Dimer, Quant: 0.9 ug/mL-FEU — ABNORMAL HIGH (ref 0.00–0.50)

## 2019-01-17 LAB — PROTIME-INR
INR: 1.6 — ABNORMAL HIGH (ref 0.8–1.2)
Prothrombin Time: 19.3 seconds — ABNORMAL HIGH (ref 11.4–15.2)

## 2019-01-17 MED ORDER — AZITHROMYCIN 500 MG PO TABS: 500.0000 mg | ORAL_TABLET | Freq: Every day | ORAL | 0 refills | Status: DC

## 2019-01-17 MED ORDER — WARFARIN SODIUM 5 MG PO TABS: 5.0000 mg | ORAL_TABLET | Freq: Once | ORAL | Status: DC

## 2019-01-17 MED ORDER — POLYETHYLENE GLYCOL 3350 17 G PO PACK: 17.0000 g | PACK | Freq: Every day | ORAL | Status: DC | PRN

## 2019-01-17 MED ORDER — DOCUSATE SODIUM 100 MG PO CAPS
100.0000 mg | ORAL_CAPSULE | Freq: Two times a day (BID) | ORAL | Status: DC
  Administered 2019-01-17: 11:00:00 100 mg via ORAL
  Filled 2019-01-17: qty 1

## 2019-01-17 MED ORDER — AZITHROMYCIN 500 MG PO TABS: 500.0000 mg | ORAL_TABLET | Freq: Every day | ORAL | 0 refills | Status: AC

## 2019-01-17 MED ORDER — CEPHALEXIN 500 MG PO CAPS: 500.0000 mg | ORAL_CAPSULE | Freq: Three times a day (TID) | ORAL | 0 refills | Status: AC

## 2019-01-17 MED ORDER — POLYETHYLENE GLYCOL 3350 17 G PO PACK
17.0000 g | PACK | Freq: Every day | ORAL | Status: DC
  Administered 2019-01-17: 17 g via ORAL
  Filled 2019-01-17: qty 1

## 2019-01-17 NOTE — Progress Notes (Signed)
ANTICOAGULATION CONSULT NOTE -follow up  Pharmacy Consult for Warfarin Indication: atrial fibrillation  No Known Allergies  Patient Measurements:   Vital Signs: Temp: 98.8 F (37.1 C) (04/13 0335) Temp Source: Oral (04/13 0335) BP: 115/64 (04/13 1044) Pulse Rate: 89 (04/13 1044)  Labs: Recent Labs    01/15/19 1619 01/15/19 1814 01/16/19 0537 01/17/19 0618  HGB 9.4*  --  9.3* 9.7*  HCT 30.4*  --  30.0* 31.2*  PLT 1,132*  --  1,080* 1,153*  LABPROT  --  19.4* 20.0* 19.3*  INR  --  1.7* 1.7* 1.6*  CREATININE 1.27*  --  1.36* 1.38*    Estimated Creatinine Clearance: 33 mL/min (A) (by C-G formula based on SCr of 1.38 mg/dL (H)).   Medical History: Past Medical History:  Diagnosis Date  . Ac isch multi vasc territories stroke ([place]) 10 yrs ago  . Anemia   . Aortic stenosis    a. s/p TAVR in 08/2016; b. followed by [facility]  . Asthma   . Blood dyscrasia   . CAD (coronary artery disease)    a. [place] 06/2016 with CTO of the RCA otherwise without obstructive CAD  . CHF (congestive heart failure) ([place])   . Chronic kidney disease   . COPD (chronic obstructive pulmonary disease) ([place])   . Dyspnea   . Dysrhythmia   . Environmental allergies   . Essential thrombocytopenia ([place])    followed by Dr Bobby Rumpf (hematology)  . GERD (gastroesophageal reflux disease)   . Gout   . Heart murmur   . History of hiatal hernia   . Hyperlipidemia   . Hypertension   . Hypoxia, sleep related   . Insomnia   . Neuropathy   . Obesity   . Osteoarthritis   . PAF (paroxysmal atrial fibrillation) ([place])    a. On Coumadin; b. followed by PCP; c. CHADS2VASc => 4 (HTN, age x 2, vascular disease)  . Presence of permanent cardiac pacemaker   . Prostate cancer [facility])    s/p seed implants     Assessment: 89yom admitted for cough/SOB.  Has Hx Afib on warfarin 3mg  daily PTA.  Admit INR 1.7. She is noted on azithromycin (which may effect warfarin sensitivity) -INR= 1.6   Goal of Therapy:  INR  2-3 Monitor platelets by anticoagulation protocol: Yes   Plan:  Warfarin 5mg  x1 tonight  Daily INR  Hildred Laser, PharmD Clinical Pharmacist **Pharmacist phone directory can now be found on [email or web address] ([email or web address]).  Listed under [place].

## 2019-01-17 NOTE — TOC Transition Note (Addendum)
Transition of Care [facility]) - CM/SW Discharge Note   Patient Details  Name: Dean [name] MRN: [id number] Date of Birth: 1924/10/01  Transition of Care [facility]) CM/SW Contact:  Ella Bodo, RN Phone Number: 01/17/2019, 2:47 PM   Clinical Narrative:   Pt admitted on 01/15/2019 with CAP.  PTA, pt resided at home with daughter, and is fairly independent.  Pt medically stable for discharge home today; MD ordered HHPT follow up.  Spoke with pt/daughter to discuss [facility] arrangements; they politely decline [place] follow up at this time.  Will notify MD.                     Discharge Plan and Services Home/Self care                            Readmission Risk Interventions Readmission Risk Prevention Plan 01/17/2019  Transportation Screening Complete  PCP or Specialist Appt within 3-5 Days Complete  HRI or [place] Patient refused  Social Work Consult for [place] Planning/Counseling Patient refused  Palliative Care Screening Not Applicable  Medication Review (RN Care Manager) Complete  Some recent data might be hidden   Reinaldo Raddle, RN, BSN  Trauma/Neuro ICU Case Manager [phone]

## 2019-01-17 NOTE — Progress Notes (Signed)
Messaged [facility]: 6E 26  pt having difficulty passing stool, c/o constipation & lower bowel cramps with back ache, given prune juice but requests Miralax as he takes regularly at home, not on MAR.

## 2019-01-18 LAB — PATHOLOGIST SMEAR REVIEW

## 2019-01-18 NOTE — Discharge Summary (Signed)
[facility] Discharge Summary   Patient: Dean [name]   PCP: Angelina Sheriff, MD DOB: 1923-12-25   Date of admission: 01/15/2019   Date of discharge: 01/17/2019     Discharge Diagnoses:  Principal Problem:   Acute hypoxemic respiratory failure (HCC) Active Problems:   PAF (paroxysmal atrial fibrillation) (HCC)   Essential thrombocytosis (HCC)   COPD (chronic obstructive pulmonary disease) ([place])   Admitted From: home Disposition:  Home with home health  Recommendations for Outpatient Follow-up:  1. Please follow up with PCP in 1 week  2. Pt has ruled out for COVID  Follow-up Information    Angelina Sheriff, MD. Schedule an appointment as soon as possible for a visit in 1 week(s).   Specialty:  Family Medicine Contact information: [place] [address] [phone]          Diet recommendation: cardiac diet  Activity: The patient is advised to gradually reintroduce usual activities.  Discharge Condition: good  Code Status: full code  History of present illness: As per the H and P dictated on admission, "Dean [name] is a 85 y.o. male with medical history significant of 1 day history of cough productive of clear sputum, shortness of breath, generalized weakness, fever 101.  He had no known contacts who have been sick.  He denies any chest pain, nausea, vomiting, abdominal pain or diarrhea.  Currently, he feels cold but is not having any complaints.  He does not wear oxygen at home."  Hospital Course:  Summary of his active problems in the hospital is as following. Acute hypoxemic respiratory failure CAP Ruled out for COVID 19 -Chest x-ray minimal right basilar subsegmental atelectasis or scarring with possible minimal right pleural effusion -COVID-19 test ordered at [facility] and came back negative -Rocephin/Azithromax for CAP coverage, blood culture remained negative - switch to oral and can go home on home  oxygen of 2 LPM  COPD -Has minimal wheezes bilaterally on admission, now resolved -Albuterol, Dulera, ipratropium inhalersat bedside, minimize nebulizer treatments until COVID ruled out  Paroxysmal atrial fibrillation -Continue Coumadin -Cardizem,Lopressor  BPH -Flomax  Chronic thrombocytosis -Follows with Hematology Dr. Bobby Rumpf  -Hydroxyureais on hold for last 4 weeks  Hyperkalemia -K6.1 at [facility], normal here.   home health was arranged by case manager. On the day of the discharge the patient's vitals were stable , and no other acute medical condition were reported by patient. the patient was felt safe to be discharge at home with home health.  Consultants: none Procedures: none  DISCHARGE MEDICATION: Allergies as of 01/17/2019   No Known Allergies     Medication List    TAKE these medications   acetaminophen 500 MG tablet Commonly known as:  TYLENOL Take 500 mg by mouth every 6 (six) hours as needed for headache (pain).   albuterol (2.5 MG/3ML) 0.083% nebulizer solution Commonly known as:  PROVENTIL Take 2.5 mg by nebulization 2 (two) times daily.   azithromycin 500 MG tablet Commonly known as:  ZITHROMAX Take 1 tablet (500 mg total) by mouth daily.   budesonide 0.5 MG/2ML nebulizer solution Commonly known as:  PULMICORT Take 0.5 mg by nebulization 2 (two) times daily.   calcitRIOL 0.25 MCG capsule Commonly known as:  ROCALTROL Take 0.25 mcg by mouth every other day.   cephALEXin 500 MG capsule Commonly known as:  Keflex Take 1 capsule (500 mg total) by mouth 3 (three) times daily for 3 days.   diltiazem 120  MG 24 hr capsule Commonly known as:  CARDIZEM CD Take 120 mg by mouth daily.   fexofenadine 180 MG tablet Commonly known as:  ALLEGRA Take 180 mg by mouth daily as needed for allergies.   fluticasone 50 MCG/ACT nasal spray Commonly known as:  FLONASE Place 2 sprays into both nostrils 2 (two) times daily.   gabapentin 300  MG capsule Commonly known as:  NEURONTIN Take 300 mg by mouth at bedtime.   hydroxyurea 500 MG capsule Commonly known as:  HYDREA Take 500 mg by mouth 2 (two) times daily.   lubiprostone 24 MCG capsule Commonly known as:  AMITIZA Take 24 mcg by mouth 2 (two) times daily as needed for constipation.   metoprolol tartrate 25 MG tablet Commonly known as:  LOPRESSOR Take 25 mg by mouth 2 (two) times daily.   montelukast 10 MG tablet Commonly known as:  SINGULAIR Take 1 tablet (10 mg total) by mouth at bedtime.   Mucinex 600 MG 12 hr tablet Generic drug:  guaiFENesin Take 600 mg by mouth 2 (two) times daily.   tamsulosin 0.4 MG Caps capsule Commonly known as:  FLOMAX Take 0.4 mg by mouth at bedtime.   TUMS PO Take 2 tablets by mouth daily as needed (heartburn/indigestion).   warfarin 3 MG tablet Commonly known as:  COUMADIN Take 1/2 tablet (1.5 mg) by mouth with 1/2 1 mg tablet daily at bedtime for a total daily dose of 2.5 mg for 6 days while on fluconazole.  After this 6 day course, please take your regular dose of 1 tablet by mouth with 1/2 of 1mg  tablet daily at bedtime for a total daily dose of 3.5mg . What changed:    how much to take  how to take this  when to take this  additional instructions      No Known Allergies Discharge Instructions    Diet - low sodium heart healthy   Complete by:  As directed    Discharge instructions   Complete by:  As directed    It is important that you read the given instructions as well as go over your medication list with RN to help you understand your care after this hospitalization.  Discharge Instructions: Please follow-up with PCP in 1-2 weeks  Please request your primary care physician to go over all Hospital Tests and Procedure/Radiological results at the follow up. Please get all Hospital records sent to your PCP by signing hospital release before you go home.   Do not take more than prescribed Pain, Sleep and Anxiety  Medications. You were cared for by a hospitalist during your hospital stay. If you have any questions about your discharge medications or the care you received while you were in the hospital after you are discharged, you can call the unit @UNIT @ you were admitted to and ask to speak with the hospitalist on call if the hospitalist that took care of you is not available.  Once you are discharged, your primary care physician will handle any further medical issues. Please note that NO REFILLS for any discharge medications will be authorized once you are discharged, as it is imperative that you return to your primary care physician (or establish a relationship with a primary care physician if you do not have one) for your aftercare needs so that they can reassess your need for medications and monitor your lab values. You Must read complete instructions/literature along with all the possible adverse reactions/side effects for all the Medicines you take  and that have been prescribed to you. Take any new Medicines after you have completely understood and accept all the possible adverse reactions/side effects. Wear Seat belts while driving. If you have smoked or chewed Tobacco in the last 2 yrs please stop smoking and/or stop any Recreational drug use.   Increase activity slowly   Complete by:  As directed      Discharge Exam: Filed Weights   01/16/19 0434  Weight: 76.7 kg   Vitals:   01/17/19 0335 01/17/19 1044  BP: 119/67 115/64  Pulse:  89  Resp: 20   Temp: 98.8 F (37.1 C)   SpO2:     General: Appear in no distress, no Rash; Oral Mucosa moist Cardiovascular: S1 and S2 Present, no Murmur, no JVD Respiratory: Bilateral Air entry present and Clear to Auscultation, no Crackles, no wheezes Abdomen: Bowel Sound present, Soft and no tenderness Extremities: no Pedal edema, n calf tenderness Neurology: Grossly no focal neuro deficit.  The results of significant diagnostics from this hospitalization  (including imaging, microbiology, ancillary and laboratory) are listed below for reference.    Significant Diagnostic Studies: No results found.  Microbiology: No results found for this or any previous visit (from the past 240 hour(s)).   Labs: CBC: Recent Labs  Lab 01/15/19 1619 01/16/19 0537 01/17/19 0618  WBC 14.7* 11.6* 9.7  NEUTROABS 11.3* 7.9* 6.2  HGB 9.4* 9.3* 9.7*  HCT 30.4* 30.0* 31.2*  MCV 111.4* 108.7* 106.5*  PLT 1,132* 1,080* 1,655*   Basic Metabolic Panel: Recent Labs  Lab 01/15/19 1619 01/16/19 0537 01/17/19 0618  NA 139 138 139  K 5.3* 4.5 4.5  CL 108 102 102  CO2 21* 25 24  GLUCOSE 97 91 96  BUN 21 23 32*  CREATININE 1.27* 1.36* 1.38*  CALCIUM 8.8* 9.0 9.0   Liver Function Tests: Recent Labs  Lab 01/16/19 0537 01/17/19 0618  AST 21 20  ALT 12 13  ALKPHOS 64 65  BILITOT 0.5 0.5  PROT 6.2* 6.4*  ALBUMIN 2.9* 3.0*   No results for input(s): LIPASE, AMYLASE in the last 168 hours. No results for input(s): AMMONIA in the last 168 hours. Cardiac Enzymes: No results for input(s): CKTOTAL, CKMB, CKMBINDEX, TROPONINI in the last 168 hours. BNP (last 3 results) Recent Labs    11/05/18 1652  BNP 164.1*   CBG: No results for input(s): GLUCAP in the last 168 hours. Time spent: 35 minutes  Signed:  Berle Mull  [facility] 01/17/2019

## 2019-01-21 DIAGNOSIS — R5381 Other malaise: Secondary | ICD-10-CM | POA: Diagnosis not present

## 2019-01-21 DIAGNOSIS — J449 Chronic obstructive pulmonary disease, unspecified: Secondary | ICD-10-CM | POA: Diagnosis not present

## 2019-01-21 DIAGNOSIS — R509 Fever, unspecified: Secondary | ICD-10-CM | POA: Diagnosis not present

## 2019-01-21 DIAGNOSIS — R5383 Other fatigue: Secondary | ICD-10-CM | POA: Diagnosis not present

## 2019-02-01 DIAGNOSIS — N183 Chronic kidney disease, stage 3 (moderate): Secondary | ICD-10-CM | POA: Diagnosis not present

## 2019-02-01 DIAGNOSIS — E213 Hyperparathyroidism, unspecified: Secondary | ICD-10-CM | POA: Diagnosis not present

## 2019-02-01 DIAGNOSIS — M109 Gout, unspecified: Secondary | ICD-10-CM | POA: Diagnosis not present

## 2019-02-09 DIAGNOSIS — D473 Essential (hemorrhagic) thrombocythemia: Secondary | ICD-10-CM | POA: Diagnosis not present

## 2019-02-09 DIAGNOSIS — D509 Iron deficiency anemia, unspecified: Secondary | ICD-10-CM | POA: Diagnosis not present

## 2019-02-11 DIAGNOSIS — Z950 Presence of cardiac pacemaker: Secondary | ICD-10-CM | POA: Diagnosis not present

## 2019-02-14 DIAGNOSIS — N183 Chronic kidney disease, stage 3 (moderate): Secondary | ICD-10-CM | POA: Diagnosis not present

## 2019-02-14 DIAGNOSIS — E875 Hyperkalemia: Secondary | ICD-10-CM | POA: Diagnosis not present

## 2019-02-15 DIAGNOSIS — E875 Hyperkalemia: Secondary | ICD-10-CM | POA: Diagnosis not present

## 2019-02-16 DIAGNOSIS — Z9181 History of falling: Secondary | ICD-10-CM | POA: Diagnosis not present

## 2019-02-16 DIAGNOSIS — I509 Heart failure, unspecified: Secondary | ICD-10-CM | POA: Diagnosis not present

## 2019-02-16 DIAGNOSIS — Z1331 Encounter for screening for depression: Secondary | ICD-10-CM | POA: Diagnosis not present

## 2019-02-16 DIAGNOSIS — Z6826 Body mass index (BMI) 26.0-26.9, adult: Secondary | ICD-10-CM | POA: Diagnosis not present

## 2019-02-16 DIAGNOSIS — Z0000 Encounter for general adult medical examination without abnormal findings: Secondary | ICD-10-CM | POA: Diagnosis not present

## 2019-02-21 DIAGNOSIS — I25118 Atherosclerotic heart disease of native coronary artery with other forms of angina pectoris: Secondary | ICD-10-CM | POA: Diagnosis not present

## 2019-02-21 DIAGNOSIS — I480 Paroxysmal atrial fibrillation: Secondary | ICD-10-CM | POA: Diagnosis not present

## 2019-02-21 DIAGNOSIS — I10 Essential (primary) hypertension: Secondary | ICD-10-CM | POA: Diagnosis not present

## 2019-02-21 DIAGNOSIS — Z45018 Encounter for adjustment and management of other part of cardiac pacemaker: Secondary | ICD-10-CM | POA: Diagnosis not present

## 2019-02-21 DIAGNOSIS — I359 Nonrheumatic aortic valve disorder, unspecified: Secondary | ICD-10-CM | POA: Diagnosis not present

## 2019-02-21 DIAGNOSIS — Z952 Presence of prosthetic heart valve: Secondary | ICD-10-CM | POA: Diagnosis not present

## 2019-03-04 DIAGNOSIS — E875 Hyperkalemia: Secondary | ICD-10-CM | POA: Diagnosis not present

## 2019-03-04 DIAGNOSIS — Z7901 Long term (current) use of anticoagulants: Secondary | ICD-10-CM | POA: Diagnosis not present

## 2019-03-09 DIAGNOSIS — J181 Lobar pneumonia, unspecified organism: Secondary | ICD-10-CM | POA: Diagnosis not present

## 2019-03-09 DIAGNOSIS — J4530 Mild persistent asthma, uncomplicated: Secondary | ICD-10-CM | POA: Diagnosis not present

## 2019-03-09 DIAGNOSIS — Z955 Presence of coronary angioplasty implant and graft: Secondary | ICD-10-CM | POA: Diagnosis not present

## 2019-03-09 DIAGNOSIS — R918 Other nonspecific abnormal finding of lung field: Secondary | ICD-10-CM | POA: Diagnosis not present

## 2019-03-09 DIAGNOSIS — I252 Old myocardial infarction: Secondary | ICD-10-CM | POA: Diagnosis not present

## 2019-03-09 DIAGNOSIS — R5383 Other fatigue: Secondary | ICD-10-CM | POA: Diagnosis not present

## 2019-03-09 DIAGNOSIS — I700 Atherosclerosis of aorta: Secondary | ICD-10-CM | POA: Diagnosis not present

## 2019-03-09 DIAGNOSIS — J84112 Idiopathic pulmonary fibrosis: Secondary | ICD-10-CM | POA: Diagnosis not present

## 2019-03-09 DIAGNOSIS — I2510 Atherosclerotic heart disease of native coronary artery without angina pectoris: Secondary | ICD-10-CM | POA: Diagnosis not present

## 2019-03-09 DIAGNOSIS — J8410 Pulmonary fibrosis, unspecified: Secondary | ICD-10-CM | POA: Diagnosis not present

## 2019-03-17 DIAGNOSIS — Z7901 Long term (current) use of anticoagulants: Secondary | ICD-10-CM | POA: Diagnosis not present

## 2019-03-22 DIAGNOSIS — L570 Actinic keratosis: Secondary | ICD-10-CM | POA: Diagnosis not present

## 2019-03-22 DIAGNOSIS — C44222 Squamous cell carcinoma of skin of right ear and external auricular canal: Secondary | ICD-10-CM | POA: Diagnosis not present

## 2019-03-22 DIAGNOSIS — D2239 Melanocytic nevi of other parts of face: Secondary | ICD-10-CM | POA: Diagnosis not present

## 2019-03-23 DIAGNOSIS — R918 Other nonspecific abnormal finding of lung field: Secondary | ICD-10-CM | POA: Diagnosis not present

## 2019-03-23 DIAGNOSIS — R5383 Other fatigue: Secondary | ICD-10-CM | POA: Diagnosis not present

## 2019-03-23 DIAGNOSIS — J4530 Mild persistent asthma, uncomplicated: Secondary | ICD-10-CM | POA: Diagnosis not present

## 2019-03-24 DIAGNOSIS — D472 Monoclonal gammopathy: Secondary | ICD-10-CM | POA: Diagnosis not present

## 2019-03-24 DIAGNOSIS — D473 Essential (hemorrhagic) thrombocythemia: Secondary | ICD-10-CM | POA: Diagnosis not present

## 2019-03-25 DIAGNOSIS — C61 Malignant neoplasm of prostate: Secondary | ICD-10-CM | POA: Diagnosis not present

## 2019-03-29 DIAGNOSIS — D472 Monoclonal gammopathy: Secondary | ICD-10-CM | POA: Diagnosis not present

## 2019-03-30 DIAGNOSIS — C44222 Squamous cell carcinoma of skin of right ear and external auricular canal: Secondary | ICD-10-CM | POA: Diagnosis not present

## 2019-04-05 DIAGNOSIS — D485 Neoplasm of uncertain behavior of skin: Secondary | ICD-10-CM | POA: Diagnosis not present

## 2019-04-12 DIAGNOSIS — Z7901 Long term (current) use of anticoagulants: Secondary | ICD-10-CM | POA: Diagnosis not present

## 2019-05-09 DIAGNOSIS — D473 Essential (hemorrhagic) thrombocythemia: Secondary | ICD-10-CM | POA: Diagnosis not present

## 2019-05-11 DIAGNOSIS — D473 Essential (hemorrhagic) thrombocythemia: Secondary | ICD-10-CM | POA: Diagnosis not present

## 2019-05-19 DIAGNOSIS — I495 Sick sinus syndrome: Secondary | ICD-10-CM | POA: Diagnosis not present

## 2019-05-19 DIAGNOSIS — Z7901 Long term (current) use of anticoagulants: Secondary | ICD-10-CM | POA: Diagnosis not present

## 2019-05-19 DIAGNOSIS — Z45018 Encounter for adjustment and management of other part of cardiac pacemaker: Secondary | ICD-10-CM | POA: Diagnosis not present

## 2019-05-23 DIAGNOSIS — K5720 Diverticulitis of large intestine with perforation and abscess without bleeding: Secondary | ICD-10-CM | POA: Diagnosis not present

## 2019-05-23 DIAGNOSIS — K219 Gastro-esophageal reflux disease without esophagitis: Secondary | ICD-10-CM | POA: Diagnosis not present

## 2019-05-23 DIAGNOSIS — M25422 Effusion, left elbow: Secondary | ICD-10-CM | POA: Diagnosis not present

## 2019-05-23 DIAGNOSIS — Z6825 Body mass index (BMI) 25.0-25.9, adult: Secondary | ICD-10-CM | POA: Diagnosis not present

## 2019-05-23 DIAGNOSIS — D473 Essential (hemorrhagic) thrombocythemia: Secondary | ICD-10-CM | POA: Diagnosis not present

## 2019-05-23 DIAGNOSIS — K222 Esophageal obstruction: Secondary | ICD-10-CM | POA: Diagnosis not present

## 2019-05-26 DIAGNOSIS — Z7901 Long term (current) use of anticoagulants: Secondary | ICD-10-CM | POA: Diagnosis not present

## 2019-06-06 DIAGNOSIS — Z1331 Encounter for screening for depression: Secondary | ICD-10-CM | POA: Diagnosis not present

## 2019-06-06 DIAGNOSIS — Z23 Encounter for immunization: Secondary | ICD-10-CM | POA: Diagnosis not present

## 2019-06-06 DIAGNOSIS — G4700 Insomnia, unspecified: Secondary | ICD-10-CM | POA: Diagnosis not present

## 2019-06-06 DIAGNOSIS — Z7901 Long term (current) use of anticoagulants: Secondary | ICD-10-CM | POA: Diagnosis not present

## 2019-06-06 DIAGNOSIS — Z6825 Body mass index (BMI) 25.0-25.9, adult: Secondary | ICD-10-CM | POA: Diagnosis not present

## 2019-06-06 DIAGNOSIS — Z9181 History of falling: Secondary | ICD-10-CM | POA: Diagnosis not present

## 2019-06-07 DIAGNOSIS — Z9049 Acquired absence of other specified parts of digestive tract: Secondary | ICD-10-CM | POA: Diagnosis not present

## 2019-06-07 DIAGNOSIS — I10 Essential (primary) hypertension: Secondary | ICD-10-CM | POA: Diagnosis not present

## 2019-06-07 DIAGNOSIS — R1310 Dysphagia, unspecified: Secondary | ICD-10-CM | POA: Diagnosis not present

## 2019-06-07 DIAGNOSIS — K449 Diaphragmatic hernia without obstruction or gangrene: Secondary | ICD-10-CM | POA: Diagnosis not present

## 2019-06-07 DIAGNOSIS — Z7901 Long term (current) use of anticoagulants: Secondary | ICD-10-CM | POA: Diagnosis not present

## 2019-06-07 DIAGNOSIS — Z79899 Other long term (current) drug therapy: Secondary | ICD-10-CM | POA: Diagnosis not present

## 2019-06-07 DIAGNOSIS — J45909 Unspecified asthma, uncomplicated: Secondary | ICD-10-CM | POA: Diagnosis not present

## 2019-06-07 DIAGNOSIS — K219 Gastro-esophageal reflux disease without esophagitis: Secondary | ICD-10-CM | POA: Diagnosis not present

## 2019-06-07 DIAGNOSIS — D473 Essential (hemorrhagic) thrombocythemia: Secondary | ICD-10-CM | POA: Diagnosis not present

## 2019-06-07 DIAGNOSIS — K222 Esophageal obstruction: Secondary | ICD-10-CM | POA: Diagnosis not present

## 2019-06-07 DIAGNOSIS — I4891 Unspecified atrial fibrillation: Secondary | ICD-10-CM | POA: Diagnosis not present

## 2019-06-16 DIAGNOSIS — D473 Essential (hemorrhagic) thrombocythemia: Secondary | ICD-10-CM | POA: Diagnosis not present

## 2019-06-20 DIAGNOSIS — E875 Hyperkalemia: Secondary | ICD-10-CM | POA: Diagnosis not present

## 2019-06-20 DIAGNOSIS — I129 Hypertensive chronic kidney disease with stage 1 through stage 4 chronic kidney disease, or unspecified chronic kidney disease: Secondary | ICD-10-CM | POA: Diagnosis not present

## 2019-06-20 DIAGNOSIS — N183 Chronic kidney disease, stage 3 (moderate): Secondary | ICD-10-CM | POA: Diagnosis not present

## 2019-06-20 DIAGNOSIS — D631 Anemia in chronic kidney disease: Secondary | ICD-10-CM | POA: Diagnosis not present

## 2019-06-20 DIAGNOSIS — E872 Acidosis: Secondary | ICD-10-CM | POA: Diagnosis not present

## 2019-06-20 DIAGNOSIS — N2581 Secondary hyperparathyroidism of renal origin: Secondary | ICD-10-CM | POA: Diagnosis not present

## 2019-06-24 DIAGNOSIS — M19012 Primary osteoarthritis, left shoulder: Secondary | ICD-10-CM | POA: Diagnosis not present

## 2019-06-27 DIAGNOSIS — C61 Malignant neoplasm of prostate: Secondary | ICD-10-CM | POA: Diagnosis not present

## 2019-07-12 DIAGNOSIS — R140 Abdominal distension (gaseous): Secondary | ICD-10-CM | POA: Diagnosis not present

## 2019-07-12 DIAGNOSIS — D473 Essential (hemorrhagic) thrombocythemia: Secondary | ICD-10-CM | POA: Diagnosis not present

## 2019-07-15 DIAGNOSIS — Z7901 Long term (current) use of anticoagulants: Secondary | ICD-10-CM | POA: Diagnosis not present

## 2019-07-18 DIAGNOSIS — D473 Essential (hemorrhagic) thrombocythemia: Secondary | ICD-10-CM | POA: Diagnosis not present

## 2019-07-25 DIAGNOSIS — K219 Gastro-esophageal reflux disease without esophagitis: Secondary | ICD-10-CM | POA: Diagnosis not present

## 2019-07-25 DIAGNOSIS — K222 Esophageal obstruction: Secondary | ICD-10-CM | POA: Diagnosis not present

## 2019-07-25 DIAGNOSIS — K648 Other hemorrhoids: Secondary | ICD-10-CM | POA: Diagnosis not present

## 2019-07-25 DIAGNOSIS — K5900 Constipation, unspecified: Secondary | ICD-10-CM | POA: Diagnosis not present

## 2019-08-05 DIAGNOSIS — I10 Essential (primary) hypertension: Secondary | ICD-10-CM | POA: Diagnosis not present

## 2019-08-05 DIAGNOSIS — E559 Vitamin D deficiency, unspecified: Secondary | ICD-10-CM | POA: Diagnosis not present

## 2019-08-11 DIAGNOSIS — H0101A Ulcerative blepharitis right eye, upper and lower eyelids: Secondary | ICD-10-CM | POA: Diagnosis not present

## 2019-08-11 DIAGNOSIS — H524 Presbyopia: Secondary | ICD-10-CM | POA: Diagnosis not present

## 2019-08-11 DIAGNOSIS — H0101B Ulcerative blepharitis left eye, upper and lower eyelids: Secondary | ICD-10-CM | POA: Diagnosis not present

## 2019-08-11 DIAGNOSIS — H35373 Puckering of macula, bilateral: Secondary | ICD-10-CM | POA: Diagnosis not present

## 2019-08-11 DIAGNOSIS — Z961 Presence of intraocular lens: Secondary | ICD-10-CM | POA: Diagnosis not present

## 2019-08-11 DIAGNOSIS — D473 Essential (hemorrhagic) thrombocythemia: Secondary | ICD-10-CM | POA: Diagnosis not present

## 2019-08-11 DIAGNOSIS — H43813 Vitreous degeneration, bilateral: Secondary | ICD-10-CM | POA: Diagnosis not present

## 2019-08-11 DIAGNOSIS — H35343 Macular cyst, hole, or pseudohole, bilateral: Secondary | ICD-10-CM | POA: Diagnosis not present

## 2019-08-12 DIAGNOSIS — D473 Essential (hemorrhagic) thrombocythemia: Secondary | ICD-10-CM | POA: Diagnosis not present

## 2019-08-16 DIAGNOSIS — Z7901 Long term (current) use of anticoagulants: Secondary | ICD-10-CM | POA: Diagnosis not present

## 2019-08-18 DIAGNOSIS — Z950 Presence of cardiac pacemaker: Secondary | ICD-10-CM | POA: Diagnosis not present

## 2019-08-22 DIAGNOSIS — K222 Esophageal obstruction: Secondary | ICD-10-CM | POA: Diagnosis not present

## 2019-08-22 DIAGNOSIS — K648 Other hemorrhoids: Secondary | ICD-10-CM | POA: Diagnosis not present

## 2019-08-22 DIAGNOSIS — K5900 Constipation, unspecified: Secondary | ICD-10-CM | POA: Diagnosis not present

## 2019-08-22 DIAGNOSIS — K219 Gastro-esophageal reflux disease without esophagitis: Secondary | ICD-10-CM | POA: Diagnosis not present

## 2019-08-26 DIAGNOSIS — D473 Essential (hemorrhagic) thrombocythemia: Secondary | ICD-10-CM | POA: Diagnosis not present

## 2019-09-20 DIAGNOSIS — Z7901 Long term (current) use of anticoagulants: Secondary | ICD-10-CM | POA: Diagnosis not present

## 2019-09-26 DIAGNOSIS — R339 Retention of urine, unspecified: Secondary | ICD-10-CM | POA: Diagnosis not present

## 2019-10-03 DIAGNOSIS — D649 Anemia, unspecified: Secondary | ICD-10-CM | POA: Diagnosis not present

## 2019-10-03 DIAGNOSIS — D473 Essential (hemorrhagic) thrombocythemia: Secondary | ICD-10-CM | POA: Diagnosis not present

## 2019-10-05 DIAGNOSIS — D649 Anemia, unspecified: Secondary | ICD-10-CM | POA: Diagnosis not present

## 2019-10-05 DIAGNOSIS — D473 Essential (hemorrhagic) thrombocythemia: Secondary | ICD-10-CM | POA: Diagnosis not present

## 2019-10-20 DIAGNOSIS — M5031 Other cervical disc degeneration,  high cervical region: Secondary | ICD-10-CM | POA: Diagnosis not present

## 2019-10-20 DIAGNOSIS — M25512 Pain in left shoulder: Secondary | ICD-10-CM | POA: Diagnosis not present

## 2019-10-20 DIAGNOSIS — G8929 Other chronic pain: Secondary | ICD-10-CM | POA: Diagnosis not present

## 2019-10-20 DIAGNOSIS — Z760 Encounter for issue of repeat prescription: Secondary | ICD-10-CM | POA: Diagnosis not present

## 2019-10-20 DIAGNOSIS — Z79891 Long term (current) use of opiate analgesic: Secondary | ICD-10-CM | POA: Diagnosis not present

## 2019-10-20 DIAGNOSIS — M50323 Other cervical disc degeneration at C6-C7 level: Secondary | ICD-10-CM | POA: Diagnosis not present

## 2019-10-20 DIAGNOSIS — M50322 Other cervical disc degeneration at C5-C6 level: Secondary | ICD-10-CM | POA: Diagnosis not present

## 2019-10-26 DIAGNOSIS — D473 Essential (hemorrhagic) thrombocythemia: Secondary | ICD-10-CM | POA: Diagnosis not present

## 2019-10-27 DIAGNOSIS — M5412 Radiculopathy, cervical region: Secondary | ICD-10-CM | POA: Diagnosis not present

## 2019-10-27 DIAGNOSIS — Z7901 Long term (current) use of anticoagulants: Secondary | ICD-10-CM | POA: Diagnosis not present

## 2019-10-27 DIAGNOSIS — M47812 Spondylosis without myelopathy or radiculopathy, cervical region: Secondary | ICD-10-CM | POA: Diagnosis not present

## 2019-10-31 DIAGNOSIS — D473 Essential (hemorrhagic) thrombocythemia: Secondary | ICD-10-CM | POA: Diagnosis not present

## 2019-11-01 DIAGNOSIS — D473 Essential (hemorrhagic) thrombocythemia: Secondary | ICD-10-CM | POA: Diagnosis not present

## 2019-11-09 DIAGNOSIS — Z7901 Long term (current) use of anticoagulants: Secondary | ICD-10-CM | POA: Diagnosis not present

## 2019-11-11 DIAGNOSIS — Z7901 Long term (current) use of anticoagulants: Secondary | ICD-10-CM | POA: Diagnosis not present

## 2019-11-14 DIAGNOSIS — Z7901 Long term (current) use of anticoagulants: Secondary | ICD-10-CM | POA: Diagnosis not present

## 2019-11-14 DIAGNOSIS — C61 Malignant neoplasm of prostate: Secondary | ICD-10-CM | POA: Diagnosis not present

## 2019-11-16 DIAGNOSIS — Z7901 Long term (current) use of anticoagulants: Secondary | ICD-10-CM | POA: Diagnosis not present

## 2019-11-17 DIAGNOSIS — Z950 Presence of cardiac pacemaker: Secondary | ICD-10-CM | POA: Diagnosis not present

## 2019-11-17 DIAGNOSIS — D473 Essential (hemorrhagic) thrombocythemia: Secondary | ICD-10-CM | POA: Diagnosis not present

## 2019-11-17 DIAGNOSIS — Z0001 Encounter for general adult medical examination with abnormal findings: Secondary | ICD-10-CM | POA: Diagnosis not present

## 2019-11-18 DIAGNOSIS — Z0001 Encounter for general adult medical examination with abnormal findings: Secondary | ICD-10-CM | POA: Diagnosis not present

## 2019-11-18 DIAGNOSIS — D473 Essential (hemorrhagic) thrombocythemia: Secondary | ICD-10-CM | POA: Diagnosis not present

## 2019-11-22 DIAGNOSIS — Z20822 Contact with and (suspected) exposure to covid-19: Secondary | ICD-10-CM | POA: Diagnosis not present

## 2019-11-22 DIAGNOSIS — R5383 Other fatigue: Secondary | ICD-10-CM | POA: Diagnosis not present

## 2019-11-22 DIAGNOSIS — J4530 Mild persistent asthma, uncomplicated: Secondary | ICD-10-CM | POA: Diagnosis not present

## 2019-11-22 DIAGNOSIS — R918 Other nonspecific abnormal finding of lung field: Secondary | ICD-10-CM | POA: Diagnosis not present

## 2019-11-22 DIAGNOSIS — R0902 Hypoxemia: Secondary | ICD-10-CM | POA: Diagnosis not present

## 2019-11-22 DIAGNOSIS — Z1152 Encounter for screening for COVID-19: Secondary | ICD-10-CM | POA: Diagnosis not present

## 2019-11-30 DIAGNOSIS — J189 Pneumonia, unspecified organism: Secondary | ICD-10-CM | POA: Diagnosis not present

## 2019-11-30 DIAGNOSIS — R0902 Hypoxemia: Secondary | ICD-10-CM | POA: Diagnosis not present

## 2019-11-30 DIAGNOSIS — R5383 Other fatigue: Secondary | ICD-10-CM | POA: Diagnosis not present

## 2019-11-30 DIAGNOSIS — J4530 Mild persistent asthma, uncomplicated: Secondary | ICD-10-CM | POA: Diagnosis not present

## 2019-11-30 DIAGNOSIS — R918 Other nonspecific abnormal finding of lung field: Secondary | ICD-10-CM | POA: Diagnosis not present

## 2019-12-01 DIAGNOSIS — I350 Nonrheumatic aortic (valve) stenosis: Secondary | ICD-10-CM | POA: Diagnosis present

## 2019-12-01 DIAGNOSIS — I130 Hypertensive heart and chronic kidney disease with heart failure and stage 1 through stage 4 chronic kidney disease, or unspecified chronic kidney disease: Secondary | ICD-10-CM | POA: Diagnosis not present

## 2019-12-01 DIAGNOSIS — M1990 Unspecified osteoarthritis, unspecified site: Secondary | ICD-10-CM | POA: Diagnosis present

## 2019-12-01 DIAGNOSIS — I69954 Hemiplegia and hemiparesis following unspecified cerebrovascular disease affecting left non-dominant side: Secondary | ICD-10-CM | POA: Diagnosis not present

## 2019-12-01 DIAGNOSIS — Z8701 Personal history of pneumonia (recurrent): Secondary | ICD-10-CM | POA: Diagnosis not present

## 2019-12-01 DIAGNOSIS — N1831 Chronic kidney disease, stage 3a: Secondary | ICD-10-CM | POA: Diagnosis not present

## 2019-12-01 DIAGNOSIS — Z79899 Other long term (current) drug therapy: Secondary | ICD-10-CM | POA: Diagnosis not present

## 2019-12-01 DIAGNOSIS — Z8546 Personal history of malignant neoplasm of prostate: Secondary | ICD-10-CM | POA: Diagnosis not present

## 2019-12-01 DIAGNOSIS — I712 Thoracic aortic aneurysm, without rupture: Secondary | ICD-10-CM | POA: Diagnosis present

## 2019-12-01 DIAGNOSIS — F0390 Unspecified dementia without behavioral disturbance: Secondary | ICD-10-CM | POA: Diagnosis not present

## 2019-12-01 DIAGNOSIS — Z954 Presence of other heart-valve replacement: Secondary | ICD-10-CM | POA: Diagnosis not present

## 2019-12-01 DIAGNOSIS — I959 Hypotension, unspecified: Secondary | ICD-10-CM | POA: Diagnosis present

## 2019-12-01 DIAGNOSIS — M109 Gout, unspecified: Secondary | ICD-10-CM | POA: Diagnosis present

## 2019-12-01 DIAGNOSIS — I5023 Acute on chronic systolic (congestive) heart failure: Secondary | ICD-10-CM | POA: Diagnosis not present

## 2019-12-01 DIAGNOSIS — J189 Pneumonia, unspecified organism: Secondary | ICD-10-CM | POA: Diagnosis not present

## 2019-12-01 DIAGNOSIS — I361 Nonrheumatic tricuspid (valve) insufficiency: Secondary | ICD-10-CM | POA: Diagnosis not present

## 2019-12-01 DIAGNOSIS — J449 Chronic obstructive pulmonary disease, unspecified: Secondary | ICD-10-CM | POA: Diagnosis present

## 2019-12-01 DIAGNOSIS — I129 Hypertensive chronic kidney disease with stage 1 through stage 4 chronic kidney disease, or unspecified chronic kidney disease: Secondary | ICD-10-CM | POA: Diagnosis not present

## 2019-12-01 DIAGNOSIS — R0602 Shortness of breath: Secondary | ICD-10-CM | POA: Diagnosis not present

## 2019-12-01 DIAGNOSIS — N400 Enlarged prostate without lower urinary tract symptoms: Secondary | ICD-10-CM | POA: Diagnosis present

## 2019-12-01 DIAGNOSIS — N189 Chronic kidney disease, unspecified: Secondary | ICD-10-CM | POA: Diagnosis not present

## 2019-12-01 DIAGNOSIS — I2510 Atherosclerotic heart disease of native coronary artery without angina pectoris: Secondary | ICD-10-CM | POA: Diagnosis present

## 2019-12-01 DIAGNOSIS — D509 Iron deficiency anemia, unspecified: Secondary | ICD-10-CM | POA: Diagnosis present

## 2019-12-01 DIAGNOSIS — J168 Pneumonia due to other specified infectious organisms: Secondary | ICD-10-CM | POA: Diagnosis not present

## 2019-12-01 DIAGNOSIS — Z950 Presence of cardiac pacemaker: Secondary | ICD-10-CM | POA: Diagnosis not present

## 2019-12-01 DIAGNOSIS — E785 Hyperlipidemia, unspecified: Secondary | ICD-10-CM | POA: Diagnosis present

## 2019-12-01 DIAGNOSIS — J90 Pleural effusion, not elsewhere classified: Secondary | ICD-10-CM | POA: Diagnosis not present

## 2019-12-01 DIAGNOSIS — I4811 Longstanding persistent atrial fibrillation: Secondary | ICD-10-CM | POA: Diagnosis not present

## 2019-12-01 DIAGNOSIS — I313 Pericardial effusion (noninflammatory): Secondary | ICD-10-CM | POA: Diagnosis not present

## 2019-12-01 DIAGNOSIS — R5383 Other fatigue: Secondary | ICD-10-CM | POA: Diagnosis present

## 2019-12-01 DIAGNOSIS — I4891 Unspecified atrial fibrillation: Secondary | ICD-10-CM | POA: Diagnosis present

## 2019-12-01 DIAGNOSIS — Z7901 Long term (current) use of anticoagulants: Secondary | ICD-10-CM | POA: Diagnosis not present

## 2019-12-01 DIAGNOSIS — I340 Nonrheumatic mitral (valve) insufficiency: Secondary | ICD-10-CM | POA: Diagnosis not present

## 2019-12-02 DIAGNOSIS — J189 Pneumonia, unspecified organism: Secondary | ICD-10-CM | POA: Diagnosis not present

## 2019-12-03 DIAGNOSIS — J189 Pneumonia, unspecified organism: Secondary | ICD-10-CM | POA: Diagnosis not present

## 2019-12-05 DIAGNOSIS — I2510 Atherosclerotic heart disease of native coronary artery without angina pectoris: Secondary | ICD-10-CM | POA: Diagnosis not present

## 2019-12-05 DIAGNOSIS — Z87440 Personal history of urinary (tract) infections: Secondary | ICD-10-CM | POA: Diagnosis not present

## 2019-12-05 DIAGNOSIS — I959 Hypotension, unspecified: Secondary | ICD-10-CM | POA: Diagnosis not present

## 2019-12-05 DIAGNOSIS — F0390 Unspecified dementia without behavioral disturbance: Secondary | ICD-10-CM | POA: Diagnosis not present

## 2019-12-05 DIAGNOSIS — I350 Nonrheumatic aortic (valve) stenosis: Secondary | ICD-10-CM | POA: Diagnosis not present

## 2019-12-05 DIAGNOSIS — Z7951 Long term (current) use of inhaled steroids: Secondary | ICD-10-CM | POA: Diagnosis not present

## 2019-12-05 DIAGNOSIS — Z8546 Personal history of malignant neoplasm of prostate: Secondary | ICD-10-CM | POA: Diagnosis not present

## 2019-12-05 DIAGNOSIS — I130 Hypertensive heart and chronic kidney disease with heart failure and stage 1 through stage 4 chronic kidney disease, or unspecified chronic kidney disease: Secondary | ICD-10-CM | POA: Diagnosis not present

## 2019-12-05 DIAGNOSIS — Z950 Presence of cardiac pacemaker: Secondary | ICD-10-CM | POA: Diagnosis not present

## 2019-12-05 DIAGNOSIS — N1831 Chronic kidney disease, stage 3a: Secondary | ICD-10-CM | POA: Diagnosis not present

## 2019-12-05 DIAGNOSIS — D509 Iron deficiency anemia, unspecified: Secondary | ICD-10-CM | POA: Diagnosis not present

## 2019-12-05 DIAGNOSIS — I69954 Hemiplegia and hemiparesis following unspecified cerebrovascular disease affecting left non-dominant side: Secondary | ICD-10-CM | POA: Diagnosis not present

## 2019-12-05 DIAGNOSIS — I712 Thoracic aortic aneurysm, without rupture: Secondary | ICD-10-CM | POA: Diagnosis not present

## 2019-12-05 DIAGNOSIS — Z8701 Personal history of pneumonia (recurrent): Secondary | ICD-10-CM | POA: Diagnosis not present

## 2019-12-05 DIAGNOSIS — E785 Hyperlipidemia, unspecified: Secondary | ICD-10-CM | POA: Diagnosis not present

## 2019-12-05 DIAGNOSIS — R339 Retention of urine, unspecified: Secondary | ICD-10-CM | POA: Diagnosis not present

## 2019-12-05 DIAGNOSIS — I4891 Unspecified atrial fibrillation: Secondary | ICD-10-CM | POA: Diagnosis not present

## 2019-12-05 DIAGNOSIS — I5023 Acute on chronic systolic (congestive) heart failure: Secondary | ICD-10-CM | POA: Diagnosis not present

## 2019-12-05 DIAGNOSIS — J449 Chronic obstructive pulmonary disease, unspecified: Secondary | ICD-10-CM | POA: Diagnosis not present

## 2019-12-05 DIAGNOSIS — M109 Gout, unspecified: Secondary | ICD-10-CM | POA: Diagnosis not present

## 2019-12-05 DIAGNOSIS — N400 Enlarged prostate without lower urinary tract symptoms: Secondary | ICD-10-CM | POA: Diagnosis not present

## 2019-12-05 DIAGNOSIS — Z954 Presence of other heart-valve replacement: Secondary | ICD-10-CM | POA: Diagnosis not present

## 2019-12-05 DIAGNOSIS — R5383 Other fatigue: Secondary | ICD-10-CM | POA: Diagnosis not present

## 2019-12-05 DIAGNOSIS — M1990 Unspecified osteoarthritis, unspecified site: Secondary | ICD-10-CM | POA: Diagnosis not present

## 2019-12-05 DIAGNOSIS — Z7901 Long term (current) use of anticoagulants: Secondary | ICD-10-CM | POA: Diagnosis not present

## 2019-12-06 DIAGNOSIS — Z7901 Long term (current) use of anticoagulants: Secondary | ICD-10-CM | POA: Diagnosis not present

## 2019-12-06 DIAGNOSIS — T82120S Displacement of cardiac electrode, sequela: Secondary | ICD-10-CM | POA: Diagnosis not present

## 2019-12-06 DIAGNOSIS — I359 Nonrheumatic aortic valve disorder, unspecified: Secondary | ICD-10-CM | POA: Diagnosis not present

## 2019-12-06 DIAGNOSIS — I480 Paroxysmal atrial fibrillation: Secondary | ICD-10-CM | POA: Diagnosis not present

## 2019-12-06 DIAGNOSIS — Z45018 Encounter for adjustment and management of other part of cardiac pacemaker: Secondary | ICD-10-CM | POA: Diagnosis not present

## 2019-12-06 DIAGNOSIS — Z09 Encounter for follow-up examination after completed treatment for conditions other than malignant neoplasm: Secondary | ICD-10-CM | POA: Diagnosis not present

## 2019-12-06 DIAGNOSIS — I10 Essential (primary) hypertension: Secondary | ICD-10-CM | POA: Diagnosis not present

## 2019-12-06 DIAGNOSIS — I25118 Atherosclerotic heart disease of native coronary artery with other forms of angina pectoris: Secondary | ICD-10-CM | POA: Diagnosis not present

## 2019-12-06 DIAGNOSIS — Z952 Presence of prosthetic heart valve: Secondary | ICD-10-CM | POA: Diagnosis not present

## 2019-12-06 DIAGNOSIS — R0609 Other forms of dyspnea: Secondary | ICD-10-CM | POA: Diagnosis not present

## 2019-12-06 DIAGNOSIS — Z950 Presence of cardiac pacemaker: Secondary | ICD-10-CM | POA: Diagnosis not present

## 2019-12-08 DIAGNOSIS — D473 Essential (hemorrhagic) thrombocythemia: Secondary | ICD-10-CM | POA: Diagnosis not present

## 2019-12-09 DIAGNOSIS — D473 Essential (hemorrhagic) thrombocythemia: Secondary | ICD-10-CM | POA: Diagnosis not present

## 2019-12-09 DIAGNOSIS — Z5189 Encounter for other specified aftercare: Secondary | ICD-10-CM | POA: Diagnosis not present

## 2019-12-12 DIAGNOSIS — N390 Urinary tract infection, site not specified: Secondary | ICD-10-CM | POA: Diagnosis not present

## 2019-12-13 DIAGNOSIS — D473 Essential (hemorrhagic) thrombocythemia: Secondary | ICD-10-CM | POA: Diagnosis not present

## 2019-12-13 DIAGNOSIS — D649 Anemia, unspecified: Secondary | ICD-10-CM | POA: Diagnosis not present

## 2019-12-13 DIAGNOSIS — I5023 Acute on chronic systolic (congestive) heart failure: Secondary | ICD-10-CM | POA: Diagnosis not present

## 2019-12-13 DIAGNOSIS — I130 Hypertensive heart and chronic kidney disease with heart failure and stage 1 through stage 4 chronic kidney disease, or unspecified chronic kidney disease: Secondary | ICD-10-CM | POA: Diagnosis not present

## 2019-12-14 DIAGNOSIS — Z6824 Body mass index (BMI) 24.0-24.9, adult: Secondary | ICD-10-CM | POA: Diagnosis not present

## 2019-12-14 DIAGNOSIS — I509 Heart failure, unspecified: Secondary | ICD-10-CM | POA: Diagnosis not present

## 2019-12-14 DIAGNOSIS — D473 Essential (hemorrhagic) thrombocythemia: Secondary | ICD-10-CM | POA: Diagnosis not present

## 2019-12-14 DIAGNOSIS — Z1331 Encounter for screening for depression: Secondary | ICD-10-CM | POA: Diagnosis not present

## 2019-12-14 DIAGNOSIS — D649 Anemia, unspecified: Secondary | ICD-10-CM | POA: Diagnosis not present

## 2019-12-14 DIAGNOSIS — Z9181 History of falling: Secondary | ICD-10-CM | POA: Diagnosis not present

## 2019-12-15 DIAGNOSIS — R339 Retention of urine, unspecified: Secondary | ICD-10-CM | POA: Diagnosis not present

## 2019-12-15 DIAGNOSIS — R300 Dysuria: Secondary | ICD-10-CM | POA: Diagnosis not present

## 2019-12-15 DIAGNOSIS — C61 Malignant neoplasm of prostate: Secondary | ICD-10-CM | POA: Diagnosis not present

## 2019-12-20 DIAGNOSIS — C61 Malignant neoplasm of prostate: Secondary | ICD-10-CM | POA: Diagnosis not present

## 2019-12-21 DIAGNOSIS — Z7901 Long term (current) use of anticoagulants: Secondary | ICD-10-CM | POA: Diagnosis not present

## 2019-12-22 DIAGNOSIS — C61 Malignant neoplasm of prostate: Secondary | ICD-10-CM | POA: Diagnosis not present

## 2019-12-22 DIAGNOSIS — R339 Retention of urine, unspecified: Secondary | ICD-10-CM | POA: Diagnosis not present

## 2019-12-23 DIAGNOSIS — D473 Essential (hemorrhagic) thrombocythemia: Secondary | ICD-10-CM | POA: Diagnosis not present

## 2019-12-29 DIAGNOSIS — N210 Calculus in bladder: Secondary | ICD-10-CM | POA: Diagnosis not present

## 2019-12-30 DIAGNOSIS — R339 Retention of urine, unspecified: Secondary | ICD-10-CM | POA: Diagnosis not present

## 2019-12-30 DIAGNOSIS — C61 Malignant neoplasm of prostate: Secondary | ICD-10-CM | POA: Diagnosis not present

## 2020-01-02 DIAGNOSIS — D473 Essential (hemorrhagic) thrombocythemia: Secondary | ICD-10-CM | POA: Diagnosis not present

## 2020-01-04 DIAGNOSIS — I5023 Acute on chronic systolic (congestive) heart failure: Secondary | ICD-10-CM | POA: Diagnosis not present

## 2020-01-05 DIAGNOSIS — R0602 Shortness of breath: Secondary | ICD-10-CM | POA: Diagnosis not present

## 2020-01-05 DIAGNOSIS — I110 Hypertensive heart disease with heart failure: Secondary | ICD-10-CM | POA: Diagnosis not present

## 2020-01-05 DIAGNOSIS — J189 Pneumonia, unspecified organism: Secondary | ICD-10-CM | POA: Diagnosis not present

## 2020-01-05 DIAGNOSIS — I5023 Acute on chronic systolic (congestive) heart failure: Secondary | ICD-10-CM | POA: Diagnosis not present

## 2020-01-05 DIAGNOSIS — I482 Chronic atrial fibrillation, unspecified: Secondary | ICD-10-CM | POA: Diagnosis not present

## 2020-01-05 DIAGNOSIS — I361 Nonrheumatic tricuspid (valve) insufficiency: Secondary | ICD-10-CM | POA: Diagnosis not present

## 2020-01-05 DIAGNOSIS — I509 Heart failure, unspecified: Secondary | ICD-10-CM | POA: Diagnosis not present

## 2020-01-05 DIAGNOSIS — J90 Pleural effusion, not elsewhere classified: Secondary | ICD-10-CM | POA: Diagnosis not present

## 2020-01-05 DIAGNOSIS — I340 Nonrheumatic mitral (valve) insufficiency: Secondary | ICD-10-CM | POA: Diagnosis not present

## 2020-01-05 DIAGNOSIS — I313 Pericardial effusion (noninflammatory): Secondary | ICD-10-CM | POA: Diagnosis not present

## 2020-01-06 DIAGNOSIS — I313 Pericardial effusion (noninflammatory): Secondary | ICD-10-CM | POA: Diagnosis present

## 2020-01-06 DIAGNOSIS — Z952 Presence of prosthetic heart valve: Secondary | ICD-10-CM | POA: Diagnosis not present

## 2020-01-06 DIAGNOSIS — J449 Chronic obstructive pulmonary disease, unspecified: Secondary | ICD-10-CM | POA: Diagnosis not present

## 2020-01-06 DIAGNOSIS — D473 Essential (hemorrhagic) thrombocythemia: Secondary | ICD-10-CM | POA: Diagnosis not present

## 2020-01-06 DIAGNOSIS — J151 Pneumonia due to Pseudomonas: Secondary | ICD-10-CM | POA: Diagnosis not present

## 2020-01-06 DIAGNOSIS — J9621 Acute and chronic respiratory failure with hypoxia: Secondary | ICD-10-CM | POA: Diagnosis not present

## 2020-01-06 DIAGNOSIS — J90 Pleural effusion, not elsewhere classified: Secondary | ICD-10-CM | POA: Diagnosis not present

## 2020-01-06 DIAGNOSIS — I4891 Unspecified atrial fibrillation: Secondary | ICD-10-CM | POA: Diagnosis not present

## 2020-01-06 DIAGNOSIS — R0602 Shortness of breath: Secondary | ICD-10-CM | POA: Diagnosis not present

## 2020-01-06 DIAGNOSIS — I10 Essential (primary) hypertension: Secondary | ICD-10-CM | POA: Diagnosis not present

## 2020-01-06 DIAGNOSIS — J440 Chronic obstructive pulmonary disease with acute lower respiratory infection: Secondary | ICD-10-CM | POA: Diagnosis present

## 2020-01-06 DIAGNOSIS — M2550 Pain in unspecified joint: Secondary | ICD-10-CM | POA: Diagnosis not present

## 2020-01-06 DIAGNOSIS — R140 Abdominal distension (gaseous): Secondary | ICD-10-CM | POA: Diagnosis not present

## 2020-01-06 DIAGNOSIS — Z7401 Bed confinement status: Secondary | ICD-10-CM | POA: Diagnosis not present

## 2020-01-06 DIAGNOSIS — I5022 Chronic systolic (congestive) heart failure: Secondary | ICD-10-CM | POA: Diagnosis not present

## 2020-01-06 DIAGNOSIS — B9620 Unspecified Escherichia coli [E. coli] as the cause of diseases classified elsewhere: Secondary | ICD-10-CM | POA: Diagnosis present

## 2020-01-06 DIAGNOSIS — D631 Anemia in chronic kidney disease: Secondary | ICD-10-CM | POA: Diagnosis present

## 2020-01-06 DIAGNOSIS — Z923 Personal history of irradiation: Secondary | ICD-10-CM | POA: Diagnosis not present

## 2020-01-06 DIAGNOSIS — I5023 Acute on chronic systolic (congestive) heart failure: Secondary | ICD-10-CM | POA: Diagnosis not present

## 2020-01-06 DIAGNOSIS — R531 Weakness: Secondary | ICD-10-CM | POA: Diagnosis not present

## 2020-01-06 DIAGNOSIS — E785 Hyperlipidemia, unspecified: Secondary | ICD-10-CM | POA: Diagnosis not present

## 2020-01-06 DIAGNOSIS — R0902 Hypoxemia: Secondary | ICD-10-CM | POA: Diagnosis not present

## 2020-01-06 DIAGNOSIS — N400 Enlarged prostate without lower urinary tract symptoms: Secondary | ICD-10-CM | POA: Diagnosis not present

## 2020-01-06 DIAGNOSIS — I482 Chronic atrial fibrillation, unspecified: Secondary | ICD-10-CM | POA: Diagnosis not present

## 2020-01-06 DIAGNOSIS — J189 Pneumonia, unspecified organism: Secondary | ICD-10-CM | POA: Diagnosis not present

## 2020-01-06 DIAGNOSIS — D509 Iron deficiency anemia, unspecified: Secondary | ICD-10-CM | POA: Diagnosis not present

## 2020-01-06 DIAGNOSIS — N401 Enlarged prostate with lower urinary tract symptoms: Secondary | ICD-10-CM | POA: Diagnosis present

## 2020-01-06 DIAGNOSIS — Z66 Do not resuscitate: Secondary | ICD-10-CM | POA: Diagnosis present

## 2020-01-06 DIAGNOSIS — N139 Obstructive and reflux uropathy, unspecified: Secondary | ICD-10-CM | POA: Diagnosis not present

## 2020-01-06 DIAGNOSIS — I110 Hypertensive heart disease with heart failure: Secondary | ICD-10-CM | POA: Diagnosis not present

## 2020-01-06 DIAGNOSIS — C61 Malignant neoplasm of prostate: Secondary | ICD-10-CM | POA: Diagnosis not present

## 2020-01-06 DIAGNOSIS — Z950 Presence of cardiac pacemaker: Secondary | ICD-10-CM | POA: Diagnosis not present

## 2020-01-06 DIAGNOSIS — I69954 Hemiplegia and hemiparesis following unspecified cerebrovascular disease affecting left non-dominant side: Secondary | ICD-10-CM | POA: Diagnosis not present

## 2020-01-06 DIAGNOSIS — N189 Chronic kidney disease, unspecified: Secondary | ICD-10-CM | POA: Diagnosis not present

## 2020-01-06 DIAGNOSIS — N138 Other obstructive and reflux uropathy: Secondary | ICD-10-CM | POA: Diagnosis present

## 2020-01-06 DIAGNOSIS — N183 Chronic kidney disease, stage 3 unspecified: Secondary | ICD-10-CM | POA: Diagnosis present

## 2020-01-06 DIAGNOSIS — N390 Urinary tract infection, site not specified: Secondary | ICD-10-CM | POA: Diagnosis present

## 2020-01-06 DIAGNOSIS — F0390 Unspecified dementia without behavioral disturbance: Secondary | ICD-10-CM | POA: Diagnosis present

## 2020-01-06 DIAGNOSIS — T83518A Infection and inflammatory reaction due to other urinary catheter, initial encounter: Secondary | ICD-10-CM | POA: Diagnosis not present

## 2020-01-06 DIAGNOSIS — J9601 Acute respiratory failure with hypoxia: Secondary | ICD-10-CM | POA: Diagnosis present

## 2020-01-06 DIAGNOSIS — I2510 Atherosclerotic heart disease of native coronary artery without angina pectoris: Secondary | ICD-10-CM | POA: Diagnosis not present

## 2020-01-06 DIAGNOSIS — J690 Pneumonitis due to inhalation of food and vomit: Secondary | ICD-10-CM | POA: Diagnosis present

## 2020-01-06 DIAGNOSIS — D649 Anemia, unspecified: Secondary | ICD-10-CM | POA: Diagnosis not present

## 2020-01-06 DIAGNOSIS — K222 Esophageal obstruction: Secondary | ICD-10-CM | POA: Diagnosis not present

## 2020-01-06 DIAGNOSIS — I130 Hypertensive heart and chronic kidney disease with heart failure and stage 1 through stage 4 chronic kidney disease, or unspecified chronic kidney disease: Secondary | ICD-10-CM | POA: Diagnosis not present

## 2020-01-06 DIAGNOSIS — K219 Gastro-esophageal reflux disease without esophagitis: Secondary | ICD-10-CM | POA: Diagnosis not present

## 2020-01-06 DIAGNOSIS — I509 Heart failure, unspecified: Secondary | ICD-10-CM | POA: Diagnosis not present

## 2020-01-06 DIAGNOSIS — I350 Nonrheumatic aortic (valve) stenosis: Secondary | ICD-10-CM | POA: Diagnosis present

## 2020-01-06 DIAGNOSIS — I69354 Hemiplegia and hemiparesis following cerebral infarction affecting left non-dominant side: Secondary | ICD-10-CM | POA: Diagnosis not present

## 2020-01-06 DIAGNOSIS — K922 Gastrointestinal hemorrhage, unspecified: Secondary | ICD-10-CM | POA: Diagnosis not present

## 2020-01-06 DIAGNOSIS — I4821 Permanent atrial fibrillation: Secondary | ICD-10-CM | POA: Diagnosis present

## 2020-01-07 DIAGNOSIS — Z952 Presence of prosthetic heart valve: Secondary | ICD-10-CM

## 2020-01-07 DIAGNOSIS — J189 Pneumonia, unspecified organism: Secondary | ICD-10-CM | POA: Diagnosis not present

## 2020-01-07 DIAGNOSIS — N189 Chronic kidney disease, unspecified: Secondary | ICD-10-CM

## 2020-01-07 DIAGNOSIS — I5023 Acute on chronic systolic (congestive) heart failure: Secondary | ICD-10-CM | POA: Diagnosis not present

## 2020-01-07 DIAGNOSIS — I313 Pericardial effusion (noninflammatory): Secondary | ICD-10-CM | POA: Diagnosis not present

## 2020-01-07 DIAGNOSIS — E785 Hyperlipidemia, unspecified: Secondary | ICD-10-CM

## 2020-01-07 DIAGNOSIS — J90 Pleural effusion, not elsewhere classified: Secondary | ICD-10-CM

## 2020-01-07 DIAGNOSIS — I482 Chronic atrial fibrillation, unspecified: Secondary | ICD-10-CM | POA: Diagnosis not present

## 2020-01-07 DIAGNOSIS — I2510 Atherosclerotic heart disease of native coronary artery without angina pectoris: Secondary | ICD-10-CM

## 2020-01-08 DIAGNOSIS — I482 Chronic atrial fibrillation, unspecified: Secondary | ICD-10-CM | POA: Diagnosis not present

## 2020-01-08 DIAGNOSIS — J90 Pleural effusion, not elsewhere classified: Secondary | ICD-10-CM | POA: Diagnosis not present

## 2020-01-08 DIAGNOSIS — I313 Pericardial effusion (noninflammatory): Secondary | ICD-10-CM | POA: Diagnosis not present

## 2020-01-08 DIAGNOSIS — J189 Pneumonia, unspecified organism: Secondary | ICD-10-CM | POA: Diagnosis not present

## 2020-01-08 DIAGNOSIS — I5023 Acute on chronic systolic (congestive) heart failure: Secondary | ICD-10-CM | POA: Diagnosis not present

## 2020-01-09 DIAGNOSIS — I5023 Acute on chronic systolic (congestive) heart failure: Secondary | ICD-10-CM | POA: Diagnosis not present

## 2020-01-09 DIAGNOSIS — I313 Pericardial effusion (noninflammatory): Secondary | ICD-10-CM | POA: Diagnosis not present

## 2020-01-09 DIAGNOSIS — I482 Chronic atrial fibrillation, unspecified: Secondary | ICD-10-CM | POA: Diagnosis not present

## 2020-01-09 DIAGNOSIS — J189 Pneumonia, unspecified organism: Secondary | ICD-10-CM | POA: Diagnosis not present

## 2020-01-09 DIAGNOSIS — J90 Pleural effusion, not elsewhere classified: Secondary | ICD-10-CM | POA: Diagnosis not present

## 2020-01-10 DIAGNOSIS — I313 Pericardial effusion (noninflammatory): Secondary | ICD-10-CM | POA: Diagnosis not present

## 2020-01-10 DIAGNOSIS — J189 Pneumonia, unspecified organism: Secondary | ICD-10-CM | POA: Diagnosis not present

## 2020-01-10 DIAGNOSIS — I482 Chronic atrial fibrillation, unspecified: Secondary | ICD-10-CM | POA: Diagnosis not present

## 2020-01-10 DIAGNOSIS — I5023 Acute on chronic systolic (congestive) heart failure: Secondary | ICD-10-CM | POA: Diagnosis not present

## 2020-01-11 DIAGNOSIS — I482 Chronic atrial fibrillation, unspecified: Secondary | ICD-10-CM | POA: Diagnosis not present

## 2020-01-11 DIAGNOSIS — J189 Pneumonia, unspecified organism: Secondary | ICD-10-CM | POA: Diagnosis not present

## 2020-01-11 DIAGNOSIS — I5023 Acute on chronic systolic (congestive) heart failure: Secondary | ICD-10-CM | POA: Diagnosis not present

## 2020-01-11 DIAGNOSIS — I313 Pericardial effusion (noninflammatory): Secondary | ICD-10-CM | POA: Diagnosis not present

## 2020-01-12 DIAGNOSIS — I5023 Acute on chronic systolic (congestive) heart failure: Secondary | ICD-10-CM | POA: Diagnosis not present

## 2020-01-12 DIAGNOSIS — I313 Pericardial effusion (noninflammatory): Secondary | ICD-10-CM | POA: Diagnosis not present

## 2020-01-12 DIAGNOSIS — I482 Chronic atrial fibrillation, unspecified: Secondary | ICD-10-CM | POA: Diagnosis not present

## 2020-01-12 DIAGNOSIS — J189 Pneumonia, unspecified organism: Secondary | ICD-10-CM | POA: Diagnosis not present

## 2020-01-13 DIAGNOSIS — I313 Pericardial effusion (noninflammatory): Secondary | ICD-10-CM | POA: Diagnosis not present

## 2020-01-13 DIAGNOSIS — I482 Chronic atrial fibrillation, unspecified: Secondary | ICD-10-CM | POA: Diagnosis not present

## 2020-01-13 DIAGNOSIS — I5023 Acute on chronic systolic (congestive) heart failure: Secondary | ICD-10-CM | POA: Diagnosis not present

## 2020-01-13 DIAGNOSIS — J189 Pneumonia, unspecified organism: Secondary | ICD-10-CM | POA: Diagnosis not present

## 2020-01-14 DIAGNOSIS — I313 Pericardial effusion (noninflammatory): Secondary | ICD-10-CM | POA: Diagnosis not present

## 2020-01-14 DIAGNOSIS — I482 Chronic atrial fibrillation, unspecified: Secondary | ICD-10-CM | POA: Diagnosis not present

## 2020-01-14 DIAGNOSIS — J189 Pneumonia, unspecified organism: Secondary | ICD-10-CM | POA: Diagnosis not present

## 2020-01-14 DIAGNOSIS — I5023 Acute on chronic systolic (congestive) heart failure: Secondary | ICD-10-CM | POA: Diagnosis not present

## 2020-01-15 DIAGNOSIS — J189 Pneumonia, unspecified organism: Secondary | ICD-10-CM | POA: Diagnosis not present

## 2020-01-15 DIAGNOSIS — I5023 Acute on chronic systolic (congestive) heart failure: Secondary | ICD-10-CM | POA: Diagnosis not present

## 2020-01-15 DIAGNOSIS — I482 Chronic atrial fibrillation, unspecified: Secondary | ICD-10-CM | POA: Diagnosis not present

## 2020-01-15 DIAGNOSIS — I313 Pericardial effusion (noninflammatory): Secondary | ICD-10-CM | POA: Diagnosis not present

## 2020-01-16 DIAGNOSIS — I482 Chronic atrial fibrillation, unspecified: Secondary | ICD-10-CM | POA: Diagnosis not present

## 2020-01-16 DIAGNOSIS — I5023 Acute on chronic systolic (congestive) heart failure: Secondary | ICD-10-CM | POA: Diagnosis not present

## 2020-01-16 DIAGNOSIS — I313 Pericardial effusion (noninflammatory): Secondary | ICD-10-CM | POA: Diagnosis not present

## 2020-01-16 DIAGNOSIS — J189 Pneumonia, unspecified organism: Secondary | ICD-10-CM | POA: Diagnosis not present

## 2020-01-17 DIAGNOSIS — N400 Enlarged prostate without lower urinary tract symptoms: Secondary | ICD-10-CM | POA: Diagnosis not present

## 2020-01-17 DIAGNOSIS — N390 Urinary tract infection, site not specified: Secondary | ICD-10-CM | POA: Diagnosis not present

## 2020-01-17 DIAGNOSIS — I482 Chronic atrial fibrillation, unspecified: Secondary | ICD-10-CM | POA: Diagnosis not present

## 2020-01-17 DIAGNOSIS — J9621 Acute and chronic respiratory failure with hypoxia: Secondary | ICD-10-CM | POA: Diagnosis not present

## 2020-01-17 DIAGNOSIS — I69354 Hemiplegia and hemiparesis following cerebral infarction affecting left non-dominant side: Secondary | ICD-10-CM | POA: Diagnosis not present

## 2020-01-17 DIAGNOSIS — R0602 Shortness of breath: Secondary | ICD-10-CM | POA: Diagnosis not present

## 2020-01-17 DIAGNOSIS — Z950 Presence of cardiac pacemaker: Secondary | ICD-10-CM | POA: Diagnosis not present

## 2020-01-17 DIAGNOSIS — J151 Pneumonia due to Pseudomonas: Secondary | ICD-10-CM | POA: Diagnosis not present

## 2020-01-17 DIAGNOSIS — R509 Fever, unspecified: Secondary | ICD-10-CM | POA: Diagnosis not present

## 2020-01-17 DIAGNOSIS — N184 Chronic kidney disease, stage 4 (severe): Secondary | ICD-10-CM | POA: Diagnosis not present

## 2020-01-17 DIAGNOSIS — R531 Weakness: Secondary | ICD-10-CM | POA: Diagnosis not present

## 2020-01-17 DIAGNOSIS — N139 Obstructive and reflux uropathy, unspecified: Secondary | ICD-10-CM | POA: Diagnosis not present

## 2020-01-17 DIAGNOSIS — I10 Essential (primary) hypertension: Secondary | ICD-10-CM | POA: Diagnosis not present

## 2020-01-17 DIAGNOSIS — J189 Pneumonia, unspecified organism: Secondary | ICD-10-CM | POA: Diagnosis not present

## 2020-01-17 DIAGNOSIS — J449 Chronic obstructive pulmonary disease, unspecified: Secondary | ICD-10-CM | POA: Diagnosis not present

## 2020-01-17 DIAGNOSIS — I4891 Unspecified atrial fibrillation: Secondary | ICD-10-CM | POA: Diagnosis not present

## 2020-01-17 DIAGNOSIS — M2550 Pain in unspecified joint: Secondary | ICD-10-CM | POA: Diagnosis not present

## 2020-01-17 DIAGNOSIS — B9620 Unspecified Escherichia coli [E. coli] as the cause of diseases classified elsewhere: Secondary | ICD-10-CM | POA: Diagnosis not present

## 2020-01-17 DIAGNOSIS — Z7401 Bed confinement status: Secondary | ICD-10-CM | POA: Diagnosis not present

## 2020-01-17 DIAGNOSIS — I5022 Chronic systolic (congestive) heart failure: Secondary | ICD-10-CM | POA: Diagnosis not present

## 2020-01-17 DIAGNOSIS — J90 Pleural effusion, not elsewhere classified: Secondary | ICD-10-CM | POA: Diagnosis not present

## 2020-01-17 DIAGNOSIS — I313 Pericardial effusion (noninflammatory): Secondary | ICD-10-CM | POA: Diagnosis not present

## 2020-01-17 DIAGNOSIS — Z923 Personal history of irradiation: Secondary | ICD-10-CM | POA: Diagnosis not present

## 2020-01-17 DIAGNOSIS — N183 Chronic kidney disease, stage 3 unspecified: Secondary | ICD-10-CM | POA: Diagnosis not present

## 2020-01-17 DIAGNOSIS — D649 Anemia, unspecified: Secondary | ICD-10-CM | POA: Diagnosis not present

## 2020-01-17 DIAGNOSIS — R140 Abdominal distension (gaseous): Secondary | ICD-10-CM | POA: Diagnosis not present

## 2020-01-17 DIAGNOSIS — I2510 Atherosclerotic heart disease of native coronary artery without angina pectoris: Secondary | ICD-10-CM | POA: Diagnosis not present

## 2020-01-17 DIAGNOSIS — R262 Difficulty in walking, not elsewhere classified: Secondary | ICD-10-CM | POA: Diagnosis not present

## 2020-01-17 DIAGNOSIS — R0902 Hypoxemia: Secondary | ICD-10-CM | POA: Diagnosis not present

## 2020-01-17 DIAGNOSIS — I5023 Acute on chronic systolic (congestive) heart failure: Secondary | ICD-10-CM | POA: Diagnosis not present

## 2020-01-19 DIAGNOSIS — D649 Anemia, unspecified: Secondary | ICD-10-CM | POA: Diagnosis not present

## 2020-01-19 DIAGNOSIS — N184 Chronic kidney disease, stage 4 (severe): Secondary | ICD-10-CM | POA: Diagnosis not present

## 2020-01-19 DIAGNOSIS — J9621 Acute and chronic respiratory failure with hypoxia: Secondary | ICD-10-CM | POA: Diagnosis not present

## 2020-01-19 DIAGNOSIS — R262 Difficulty in walking, not elsewhere classified: Secondary | ICD-10-CM | POA: Diagnosis not present

## 2020-01-27 ENCOUNTER — Other Ambulatory Visit: Payer: Self-pay | Admitting: *Deleted

## 2020-01-27 NOTE — Patient Outreach (Signed)
Member screened for potential [facility] Care Management needs as a benefit of [place] [employer].  Confirmed in Patient Dean [name] that [name] expired on 2020/02/14 at [place].   Marthenia Rolling, MSN-Ed, RN,BSN [place] Acute Care Coordinator [phone] [facility]) [phone]  (Toll free office)

## 2020-02-04 DEATH — deceased
